# Patient Record
Sex: Female | Born: 1945 | Race: White | Hispanic: No | Marital: Married | Smoking: Former smoker
Health system: Southern US, Community
[De-identification: ages and names within clinical notes are randomized; demographics above are authoritative.]

## PROBLEM LIST (undated history)

## (undated) DIAGNOSIS — E669 Obesity, unspecified: Secondary | ICD-10-CM

## (undated) DIAGNOSIS — G4730 Sleep apnea, unspecified: Secondary | ICD-10-CM

## (undated) DIAGNOSIS — F32A Depression, unspecified: Secondary | ICD-10-CM

## (undated) DIAGNOSIS — I10 Essential (primary) hypertension: Secondary | ICD-10-CM

## (undated) DIAGNOSIS — I8390 Asymptomatic varicose veins of unspecified lower extremity: Secondary | ICD-10-CM

## (undated) DIAGNOSIS — E785 Hyperlipidemia, unspecified: Secondary | ICD-10-CM

## (undated) DIAGNOSIS — F329 Major depressive disorder, single episode, unspecified: Secondary | ICD-10-CM

## (undated) DIAGNOSIS — M1990 Unspecified osteoarthritis, unspecified site: Secondary | ICD-10-CM

## (undated) DIAGNOSIS — F419 Anxiety disorder, unspecified: Secondary | ICD-10-CM

## (undated) HISTORY — DX: Essential (primary) hypertension: I10

## (undated) HISTORY — DX: Depression, unspecified: F32.A

## (undated) HISTORY — DX: Hyperlipidemia, unspecified: E78.5

## (undated) HISTORY — DX: Anxiety disorder, unspecified: F41.9

## (undated) HISTORY — PX: FRACTURE SURGERY: SHX138

## (undated) HISTORY — DX: Obesity, unspecified: E66.9

## (undated) HISTORY — DX: Major depressive disorder, single episode, unspecified: F32.9

## (undated) HISTORY — DX: Unspecified osteoarthritis, unspecified site: M19.90

---

## 2007-05-24 HISTORY — PX: JOINT REPLACEMENT: SHX530

## 2010-08-10 ENCOUNTER — Ambulatory Visit: Payer: Self-pay | Admitting: Family Medicine

## 2012-07-16 DIAGNOSIS — G4710 Hypersomnia, unspecified: Secondary | ICD-10-CM | POA: Diagnosis not present

## 2012-07-16 DIAGNOSIS — I10 Essential (primary) hypertension: Secondary | ICD-10-CM | POA: Diagnosis not present

## 2012-07-16 DIAGNOSIS — E785 Hyperlipidemia, unspecified: Secondary | ICD-10-CM | POA: Diagnosis not present

## 2012-07-16 DIAGNOSIS — R5383 Other fatigue: Secondary | ICD-10-CM | POA: Diagnosis not present

## 2012-10-09 DIAGNOSIS — I10 Essential (primary) hypertension: Secondary | ICD-10-CM | POA: Diagnosis not present

## 2012-10-09 DIAGNOSIS — G4700 Insomnia, unspecified: Secondary | ICD-10-CM | POA: Diagnosis not present

## 2012-10-09 DIAGNOSIS — E7800 Pure hypercholesterolemia, unspecified: Secondary | ICD-10-CM | POA: Diagnosis not present

## 2013-01-08 DIAGNOSIS — Z1289 Encounter for screening for malignant neoplasm of other sites: Secondary | ICD-10-CM | POA: Diagnosis not present

## 2013-01-08 DIAGNOSIS — I10 Essential (primary) hypertension: Secondary | ICD-10-CM | POA: Diagnosis not present

## 2013-01-08 DIAGNOSIS — Z0000 Encounter for general adult medical examination without abnormal findings: Secondary | ICD-10-CM | POA: Diagnosis not present

## 2013-01-08 DIAGNOSIS — E785 Hyperlipidemia, unspecified: Secondary | ICD-10-CM | POA: Diagnosis not present

## 2013-01-08 DIAGNOSIS — E669 Obesity, unspecified: Secondary | ICD-10-CM | POA: Diagnosis not present

## 2013-01-11 DIAGNOSIS — R7989 Other specified abnormal findings of blood chemistry: Secondary | ICD-10-CM | POA: Diagnosis not present

## 2013-02-04 DIAGNOSIS — J209 Acute bronchitis, unspecified: Secondary | ICD-10-CM | POA: Diagnosis not present

## 2013-02-28 DIAGNOSIS — Z23 Encounter for immunization: Secondary | ICD-10-CM | POA: Diagnosis not present

## 2013-05-07 DIAGNOSIS — L821 Other seborrheic keratosis: Secondary | ICD-10-CM | POA: Diagnosis not present

## 2013-05-07 DIAGNOSIS — D235 Other benign neoplasm of skin of trunk: Secondary | ICD-10-CM | POA: Diagnosis not present

## 2013-05-07 DIAGNOSIS — D485 Neoplasm of uncertain behavior of skin: Secondary | ICD-10-CM | POA: Diagnosis not present

## 2013-05-07 DIAGNOSIS — L820 Inflamed seborrheic keratosis: Secondary | ICD-10-CM | POA: Diagnosis not present

## 2013-05-07 DIAGNOSIS — L578 Other skin changes due to chronic exposure to nonionizing radiation: Secondary | ICD-10-CM | POA: Diagnosis not present

## 2013-05-07 DIAGNOSIS — D239 Other benign neoplasm of skin, unspecified: Secondary | ICD-10-CM

## 2013-05-07 DIAGNOSIS — L570 Actinic keratosis: Secondary | ICD-10-CM | POA: Diagnosis not present

## 2013-05-07 HISTORY — DX: Other benign neoplasm of skin, unspecified: D23.9

## 2013-05-29 DIAGNOSIS — D485 Neoplasm of uncertain behavior of skin: Secondary | ICD-10-CM | POA: Diagnosis not present

## 2013-07-02 DIAGNOSIS — F329 Major depressive disorder, single episode, unspecified: Secondary | ICD-10-CM | POA: Diagnosis not present

## 2013-07-02 DIAGNOSIS — E785 Hyperlipidemia, unspecified: Secondary | ICD-10-CM | POA: Diagnosis not present

## 2013-07-02 DIAGNOSIS — F3289 Other specified depressive episodes: Secondary | ICD-10-CM | POA: Diagnosis not present

## 2013-07-02 DIAGNOSIS — I10 Essential (primary) hypertension: Secondary | ICD-10-CM | POA: Diagnosis not present

## 2013-07-03 DIAGNOSIS — E7800 Pure hypercholesterolemia, unspecified: Secondary | ICD-10-CM | POA: Diagnosis not present

## 2013-09-12 DIAGNOSIS — M1710 Unilateral primary osteoarthritis, unspecified knee: Secondary | ICD-10-CM | POA: Diagnosis not present

## 2013-09-12 DIAGNOSIS — IMO0002 Reserved for concepts with insufficient information to code with codable children: Secondary | ICD-10-CM | POA: Diagnosis not present

## 2013-11-21 DIAGNOSIS — M25579 Pain in unspecified ankle and joints of unspecified foot: Secondary | ICD-10-CM | POA: Diagnosis not present

## 2013-11-21 DIAGNOSIS — S82839A Other fracture of upper and lower end of unspecified fibula, initial encounter for closed fracture: Secondary | ICD-10-CM | POA: Diagnosis not present

## 2013-11-21 DIAGNOSIS — S8263XA Displaced fracture of lateral malleolus of unspecified fibula, initial encounter for closed fracture: Secondary | ICD-10-CM | POA: Diagnosis not present

## 2013-11-21 DIAGNOSIS — W19XXXA Unspecified fall, initial encounter: Secondary | ICD-10-CM | POA: Diagnosis not present

## 2013-12-03 DIAGNOSIS — I781 Nevus, non-neoplastic: Secondary | ICD-10-CM | POA: Diagnosis not present

## 2013-12-03 DIAGNOSIS — D485 Neoplasm of uncertain behavior of skin: Secondary | ICD-10-CM | POA: Diagnosis not present

## 2013-12-03 DIAGNOSIS — D239 Other benign neoplasm of skin, unspecified: Secondary | ICD-10-CM | POA: Diagnosis not present

## 2013-12-03 DIAGNOSIS — L821 Other seborrheic keratosis: Secondary | ICD-10-CM | POA: Diagnosis not present

## 2013-12-03 DIAGNOSIS — I8390 Asymptomatic varicose veins of unspecified lower extremity: Secondary | ICD-10-CM | POA: Diagnosis not present

## 2013-12-27 DIAGNOSIS — S93409A Sprain of unspecified ligament of unspecified ankle, initial encounter: Secondary | ICD-10-CM | POA: Diagnosis not present

## 2013-12-27 DIAGNOSIS — M25579 Pain in unspecified ankle and joints of unspecified foot: Secondary | ICD-10-CM | POA: Diagnosis not present

## 2014-01-15 DIAGNOSIS — F3289 Other specified depressive episodes: Secondary | ICD-10-CM | POA: Diagnosis not present

## 2014-01-15 DIAGNOSIS — I10 Essential (primary) hypertension: Secondary | ICD-10-CM | POA: Diagnosis not present

## 2014-01-15 DIAGNOSIS — E669 Obesity, unspecified: Secondary | ICD-10-CM | POA: Diagnosis not present

## 2014-01-15 DIAGNOSIS — Z01419 Encounter for gynecological examination (general) (routine) without abnormal findings: Secondary | ICD-10-CM | POA: Diagnosis not present

## 2014-01-15 DIAGNOSIS — F329 Major depressive disorder, single episode, unspecified: Secondary | ICD-10-CM | POA: Diagnosis not present

## 2014-01-15 DIAGNOSIS — E785 Hyperlipidemia, unspecified: Secondary | ICD-10-CM | POA: Diagnosis not present

## 2014-01-15 DIAGNOSIS — Z23 Encounter for immunization: Secondary | ICD-10-CM | POA: Diagnosis not present

## 2014-02-13 ENCOUNTER — Ambulatory Visit: Payer: Self-pay | Admitting: Family Medicine

## 2014-02-13 DIAGNOSIS — Z1231 Encounter for screening mammogram for malignant neoplasm of breast: Secondary | ICD-10-CM | POA: Diagnosis not present

## 2014-02-13 DIAGNOSIS — M899 Disorder of bone, unspecified: Secondary | ICD-10-CM | POA: Diagnosis not present

## 2014-02-13 DIAGNOSIS — Z780 Asymptomatic menopausal state: Secondary | ICD-10-CM | POA: Diagnosis not present

## 2014-02-13 DIAGNOSIS — M949 Disorder of cartilage, unspecified: Secondary | ICD-10-CM | POA: Diagnosis not present

## 2014-02-18 DIAGNOSIS — G4733 Obstructive sleep apnea (adult) (pediatric): Secondary | ICD-10-CM | POA: Diagnosis not present

## 2014-02-19 ENCOUNTER — Ambulatory Visit: Payer: Self-pay | Admitting: Family Medicine

## 2014-02-19 DIAGNOSIS — N6009 Solitary cyst of unspecified breast: Secondary | ICD-10-CM | POA: Diagnosis not present

## 2014-02-19 DIAGNOSIS — N63 Unspecified lump in unspecified breast: Secondary | ICD-10-CM | POA: Diagnosis not present

## 2014-03-04 DIAGNOSIS — Z23 Encounter for immunization: Secondary | ICD-10-CM | POA: Diagnosis not present

## 2014-04-23 DIAGNOSIS — J029 Acute pharyngitis, unspecified: Secondary | ICD-10-CM | POA: Diagnosis not present

## 2014-05-05 DIAGNOSIS — G4733 Obstructive sleep apnea (adult) (pediatric): Secondary | ICD-10-CM | POA: Diagnosis not present

## 2014-07-17 DIAGNOSIS — I10 Essential (primary) hypertension: Secondary | ICD-10-CM | POA: Diagnosis not present

## 2014-07-17 DIAGNOSIS — E785 Hyperlipidemia, unspecified: Secondary | ICD-10-CM | POA: Diagnosis not present

## 2014-07-17 DIAGNOSIS — F418 Other specified anxiety disorders: Secondary | ICD-10-CM | POA: Diagnosis not present

## 2014-08-04 DIAGNOSIS — Z1283 Encounter for screening for malignant neoplasm of skin: Secondary | ICD-10-CM | POA: Diagnosis not present

## 2014-08-04 DIAGNOSIS — D485 Neoplasm of uncertain behavior of skin: Secondary | ICD-10-CM | POA: Diagnosis not present

## 2014-08-04 DIAGNOSIS — D1800 Hemangioma unspecified site: Secondary | ICD-10-CM | POA: Diagnosis not present

## 2014-08-04 DIAGNOSIS — I8390 Asymptomatic varicose veins of unspecified lower extremity: Secondary | ICD-10-CM | POA: Diagnosis not present

## 2014-08-04 DIAGNOSIS — D229 Melanocytic nevi, unspecified: Secondary | ICD-10-CM | POA: Diagnosis not present

## 2014-08-04 DIAGNOSIS — L578 Other skin changes due to chronic exposure to nonionizing radiation: Secondary | ICD-10-CM | POA: Diagnosis not present

## 2014-08-25 DIAGNOSIS — M79609 Pain in unspecified limb: Secondary | ICD-10-CM | POA: Diagnosis not present

## 2014-08-25 DIAGNOSIS — I8310 Varicose veins of unspecified lower extremity with inflammation: Secondary | ICD-10-CM | POA: Diagnosis not present

## 2014-08-25 DIAGNOSIS — I872 Venous insufficiency (chronic) (peripheral): Secondary | ICD-10-CM | POA: Diagnosis not present

## 2014-08-25 DIAGNOSIS — M7989 Other specified soft tissue disorders: Secondary | ICD-10-CM | POA: Diagnosis not present

## 2014-09-18 DIAGNOSIS — H2513 Age-related nuclear cataract, bilateral: Secondary | ICD-10-CM | POA: Diagnosis not present

## 2014-10-29 ENCOUNTER — Other Ambulatory Visit: Payer: Self-pay | Admitting: Family Medicine

## 2014-10-30 NOTE — Telephone Encounter (Signed)
OK for her to come pick up. Needs appointment for PE with MAC in August per PP notes.

## 2014-10-31 NOTE — Telephone Encounter (Signed)
10/31/14 l/m for pt to make appt prior to picking up meds.  Prescription is at the front after appt is made.

## 2014-12-04 DIAGNOSIS — E669 Obesity, unspecified: Secondary | ICD-10-CM | POA: Diagnosis not present

## 2014-12-04 DIAGNOSIS — E785 Hyperlipidemia, unspecified: Secondary | ICD-10-CM | POA: Diagnosis not present

## 2014-12-04 DIAGNOSIS — I872 Venous insufficiency (chronic) (peripheral): Secondary | ICD-10-CM | POA: Diagnosis not present

## 2014-12-04 DIAGNOSIS — M7989 Other specified soft tissue disorders: Secondary | ICD-10-CM | POA: Diagnosis not present

## 2014-12-04 DIAGNOSIS — I8310 Varicose veins of unspecified lower extremity with inflammation: Secondary | ICD-10-CM | POA: Diagnosis not present

## 2014-12-04 DIAGNOSIS — M79609 Pain in unspecified limb: Secondary | ICD-10-CM | POA: Diagnosis not present

## 2015-01-16 DIAGNOSIS — I10 Essential (primary) hypertension: Secondary | ICD-10-CM | POA: Insufficient documentation

## 2015-01-16 DIAGNOSIS — E785 Hyperlipidemia, unspecified: Secondary | ICD-10-CM | POA: Insufficient documentation

## 2015-01-16 DIAGNOSIS — F419 Anxiety disorder, unspecified: Secondary | ICD-10-CM | POA: Insufficient documentation

## 2015-01-16 DIAGNOSIS — F320 Major depressive disorder, single episode, mild: Secondary | ICD-10-CM | POA: Insufficient documentation

## 2015-01-16 DIAGNOSIS — F329 Major depressive disorder, single episode, unspecified: Secondary | ICD-10-CM

## 2015-01-16 DIAGNOSIS — F32A Depression, unspecified: Secondary | ICD-10-CM | POA: Insufficient documentation

## 2015-01-19 ENCOUNTER — Other Ambulatory Visit: Payer: Self-pay

## 2015-01-19 ENCOUNTER — Ambulatory Visit (INDEPENDENT_AMBULATORY_CARE_PROVIDER_SITE_OTHER): Payer: Medicare Other | Admitting: Family Medicine

## 2015-01-19 ENCOUNTER — Encounter: Payer: Self-pay | Admitting: Family Medicine

## 2015-01-19 VITALS — BP 124/74 | HR 56 | Temp 98.1°F | Ht 65.2 in | Wt 206.0 lb

## 2015-01-19 DIAGNOSIS — I10 Essential (primary) hypertension: Secondary | ICD-10-CM

## 2015-01-19 DIAGNOSIS — Z124 Encounter for screening for malignant neoplasm of cervix: Secondary | ICD-10-CM | POA: Diagnosis not present

## 2015-01-19 DIAGNOSIS — E785 Hyperlipidemia, unspecified: Secondary | ICD-10-CM | POA: Diagnosis not present

## 2015-01-19 DIAGNOSIS — Z0000 Encounter for general adult medical examination without abnormal findings: Secondary | ICD-10-CM

## 2015-01-19 DIAGNOSIS — F329 Major depressive disorder, single episode, unspecified: Secondary | ICD-10-CM

## 2015-01-19 DIAGNOSIS — Z8742 Personal history of other diseases of the female genital tract: Secondary | ICD-10-CM | POA: Diagnosis not present

## 2015-01-19 DIAGNOSIS — F32A Depression, unspecified: Secondary | ICD-10-CM

## 2015-01-19 DIAGNOSIS — N952 Postmenopausal atrophic vaginitis: Secondary | ICD-10-CM | POA: Diagnosis not present

## 2015-01-19 LAB — URINALYSIS, ROUTINE W REFLEX MICROSCOPIC
BILIRUBIN UA: NEGATIVE
GLUCOSE, UA: NEGATIVE
KETONES UA: NEGATIVE
Leukocytes, UA: NEGATIVE
Nitrite, UA: NEGATIVE
RBC UA: NEGATIVE
SPEC GRAV UA: 1.015 (ref 1.005–1.030)
UUROB: 1 mg/dL (ref 0.2–1.0)
pH, UA: 7.5 (ref 5.0–7.5)

## 2015-01-19 MED ORDER — ZOLPIDEM TARTRATE 10 MG PO TABS: ORAL_TABLET | ORAL | Status: DC

## 2015-01-19 MED ORDER — AMLODIPINE BESYLATE 5 MG PO TABS: 5.0000 mg | ORAL_TABLET | Freq: Every day | ORAL | Status: DC

## 2015-01-19 MED ORDER — ATORVASTATIN CALCIUM 20 MG PO TABS: 20.0000 mg | ORAL_TABLET | Freq: Every day | ORAL | Status: DC

## 2015-01-19 MED ORDER — ESTRADIOL 0.1 MG/GM VA CREA: 1.0000 | TOPICAL_CREAM | VAGINAL | Status: DC

## 2015-01-19 MED ORDER — FLUOXETINE HCL 40 MG PO CAPS: 40.0000 mg | ORAL_CAPSULE | Freq: Every day | ORAL | Status: DC

## 2015-01-19 MED ORDER — ATENOLOL 50 MG PO TABS: 50.0000 mg | ORAL_TABLET | Freq: Every day | ORAL | Status: DC

## 2015-01-19 MED ORDER — LOSARTAN POTASSIUM-HCTZ 100-12.5 MG PO TABS: 1.0000 | ORAL_TABLET | Freq: Every day | ORAL | Status: DC

## 2015-01-19 NOTE — Assessment & Plan Note (Signed)
The current medical regimen is effective;  continue present plan and medications.  

## 2015-01-19 NOTE — Progress Notes (Signed)
BP 124/74 mmHg  Pulse 56  Temp(Src) 98.1 F (36.7 C)  Ht 5' 5.2" (1.656 m)  Wt 206 lb (93.441 kg)  BMI 34.07 kg/m2  SpO2 99%   Subjective:    Patient ID: Sherry Davila, female    DOB: [birth year], 69 y.o.   MRN: [id number]  HPI: Sherry Davila is a 69 y.o. female  Chief Complaint  Patient presents with  . Annual Exam   patient's over the last several years has had intermittent constipation for 2-3 days then we'll have a loose bowel movement series over a period of an hour then is normal again for 2-3 days no blood noted and stool eats lots of fiber on a regular basis. Blood pressure doing well Cholesterol medicines doing well Takes meds faithfully with no side effects Uses Ambien on a rare when necessary basis has been out for some time wants a refill just to have.  Relevant past medical, surgical, family and social history reviewed and updated as indicated. Interim medical history since our last visit reviewed. Allergies and medications reviewed and updated.  Review of Systems  Constitutional: Negative.   HENT: Negative.   Eyes: Negative.   Respiratory: Negative.   Cardiovascular: Negative.   Gastrointestinal: Negative.   Endocrine: Negative.   Genitourinary: Negative.   Musculoskeletal: Negative.   Skin: Negative.   Allergic/Immunologic: Negative.   Neurological: Negative.   Hematological: Negative.   Psychiatric/Behavioral: Negative.     Per HPI unless specifically indicated above     Objective:    BP 124/74 mmHg  Pulse 56  Temp(Src) 98.1 F (36.7 C)  Ht 5' 5.2" (1.656 m)  Wt 206 lb (93.441 kg)  BMI 34.07 kg/m2  SpO2 99%  Wt Readings from Last 3 Encounters:  01/19/15 206 lb (93.441 kg)  07/17/14 200 lb (90.719 kg)    Physical Exam  Constitutional: She is oriented to person, place, and time. She appears well-developed and well-nourished.  HENT:  Head: Normocephalic and atraumatic.  Right Ear: External ear normal.  Left Ear: External ear  normal.  Nose: Nose normal.  Mouth/Throat: Oropharynx is clear and moist.  Eyes: Conjunctivae and EOM are normal. Pupils are equal, round, and reactive to light.  Neck: Normal range of motion. Neck supple. Carotid bruit is not present.  Cardiovascular: Normal rate, regular rhythm and normal heart sounds.   No murmur heard. Pulmonary/Chest: Effort normal and breath sounds normal.  Abdominal: Soft. Bowel sounds are normal. There is no hepatosplenomegaly.  Genitourinary: Vagina normal and uterus normal.  Musculoskeletal: Normal range of motion.  Neurological: She is alert and oriented to person, place, and time.  Skin: No rash noted.  Psychiatric: She has a normal mood and affect. Her behavior is normal. Judgment and thought content normal.    No results found for this or any previous visit.    Assessment & Plan:   Problem List Items Addressed This Visit      Cardiovascular and Mediastinum   Hypertension - Primary    The current medical regimen is effective;  continue present plan and medications.       Relevant Medications   losartan-hydrochlorothiazide (HYZAAR) 100-12.5 MG per tablet   atorvastatin (LIPITOR) 20 MG tablet   atenolol (TENORMIN) 50 MG tablet   amLODipine (NORVASC) 5 MG tablet   Other Relevant Orders   CBC with Differential/Platelet   Comprehensive metabolic panel   Urinalysis, Routine w reflex microscopic (not at [facility])   TSH     Genitourinary  Atrophic vaginitis    The current medical regimen is effective;  continue present plan and medications.       Relevant Medications   estradiol (ESTRACE VAGINAL) 0.1 MG/GM vaginal cream     Other   Hyperlipidemia    The current medical regimen is effective;  continue present plan and medications.       Relevant Medications   losartan-hydrochlorothiazide (HYZAAR) 100-12.5 MG per tablet   atorvastatin (LIPITOR) 20 MG tablet   atenolol (TENORMIN) 50 MG tablet   amLODipine (NORVASC) 5 MG tablet   Other  Relevant Orders   CBC with Differential/Platelet   Lipid panel   Comprehensive metabolic panel   Urinalysis, Routine w reflex microscopic (not at [facility])   TSH   Depression    The current medical regimen is effective;  continue present plan and medications.       Relevant Medications   FLUoxetine (PROZAC) 40 MG capsule   Other Relevant Orders   CBC with Differential/Platelet   Comprehensive metabolic panel   Urinalysis, Routine w reflex microscopic (not at [facility])   TSH    Other Visit Diagnoses    PE (physical exam), annual        Relevant Orders    CBC with Differential/Platelet    Lipid panel    Comprehensive metabolic panel    Urinalysis, Routine w reflex microscopic (not at [facility])    TSH        Follow up plan: Return in about 6 months (around 07/21/2015), or if symptoms worsen or fail to improve, for med check.

## 2015-01-19 NOTE — Addendum Note (Signed)
Addended by: Wynn Maudlin on: 01/19/2015 03:52 PM   Modules accepted: Orders, SmartSet

## 2015-01-20 ENCOUNTER — Encounter: Payer: Self-pay | Admitting: Family Medicine

## 2015-01-20 LAB — COMPREHENSIVE METABOLIC PANEL
ALBUMIN: 4.1 g/dL (ref 3.6–4.8)
ALK PHOS: 93 IU/L (ref 39–117)
ALT: 20 IU/L (ref 0–32)
AST: 21 IU/L (ref 0–40)
Albumin/Globulin Ratio: 1.8 (ref 1.1–2.5)
BUN / CREAT RATIO: 21 (ref 11–26)
BUN: 17 mg/dL (ref 8–27)
Bilirubin Total: 0.4 mg/dL (ref 0.0–1.2)
CO2: 25 mmol/L (ref 18–29)
CREATININE: 0.81 mg/dL (ref 0.57–1.00)
Calcium: 9.1 mg/dL (ref 8.7–10.3)
Chloride: 102 mmol/L (ref 97–108)
GFR calc Af Amer: 87 mL/min/{1.73_m2} (ref >59)
GFR calc non Af Amer: 75 mL/min/{1.73_m2} (ref >59)
GLUCOSE: 90 mg/dL (ref 65–99)
Globulin, Total: 2.3 g/dL (ref 1.5–4.5)
Potassium: 4.5 mmol/L (ref 3.5–5.2)
Sodium: 144 mmol/L (ref 134–144)
Total Protein: 6.4 g/dL (ref 6.0–8.5)

## 2015-01-20 LAB — CBC WITH DIFFERENTIAL/PLATELET
BASOS: 1 %
Basophils Absolute: 0.1 10*3/uL (ref 0.0–0.2)
EOS (ABSOLUTE): 0.6 10*3/uL — AB (ref 0.0–0.4)
Eos: 8 %
Hematocrit: 41.4 % (ref 34.0–46.6)
Hemoglobin: 13.4 g/dL (ref 11.1–15.9)
IMMATURE GRANS (ABS): 0 10*3/uL (ref 0.0–0.1)
Immature Granulocytes: 0 %
LYMPHS ABS: 2 10*3/uL (ref 0.7–3.1)
Lymphs: 26 %
MCH: 29.1 pg (ref 26.6–33.0)
MCHC: 32.4 g/dL (ref 31.5–35.7)
MCV: 90 fL (ref 79–97)
MONOS ABS: 0.7 10*3/uL (ref 0.1–0.9)
Monocytes: 10 %
NEUTROS ABS: 4.2 10*3/uL (ref 1.4–7.0)
Neutrophils: 55 %
PLATELETS: 252 10*3/uL (ref 150–379)
RBC: 4.61 x10E6/uL (ref 3.77–5.28)
RDW: 14.3 % (ref 12.3–15.4)
WBC: 7.5 10*3/uL (ref 3.4–10.8)

## 2015-01-20 LAB — LIPID PANEL
Chol/HDL Ratio: 1.8 ratio units (ref 0.0–4.4)
Cholesterol, Total: 195 mg/dL (ref 100–199)
HDL: 107 mg/dL (ref >39)
LDL Calculated: 60 mg/dL (ref 0–99)
Triglycerides: 141 mg/dL (ref 0–149)
VLDL CHOLESTEROL CAL: 28 mg/dL (ref 5–40)

## 2015-01-20 LAB — TSH: TSH: 1.8 u[IU]/mL (ref 0.450–4.500)

## 2015-01-23 LAB — IGP, APTIMA HPV, RFX 16/18,45
HPV APTIMA: NEGATIVE
PAP SMEAR COMMENT: 0

## 2015-01-24 ENCOUNTER — Other Ambulatory Visit: Payer: Self-pay | Admitting: Family Medicine
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
## 2015-02-05 ENCOUNTER — Emergency Department: Payer: Medicare Other

## 2015-02-05 ENCOUNTER — Emergency Department
Admission: EM | Admit: 2015-02-05 | Discharge: 2015-02-05 | Disposition: A | Discharge status: Home / Self Care | Payer: Medicare Other | Attending: Emergency Medicine | Admitting: Emergency Medicine

## 2015-02-05 DIAGNOSIS — M7989 Other specified soft tissue disorders: Secondary | ICD-10-CM | POA: Diagnosis not present

## 2015-02-05 DIAGNOSIS — I10 Essential (primary) hypertension: Secondary | ICD-10-CM | POA: Insufficient documentation

## 2015-02-05 DIAGNOSIS — Z79899 Other long term (current) drug therapy: Secondary | ICD-10-CM | POA: Insufficient documentation

## 2015-02-05 DIAGNOSIS — Z791 Long term (current) use of non-steroidal anti-inflammatories (NSAID): Secondary | ICD-10-CM | POA: Diagnosis not present

## 2015-02-05 DIAGNOSIS — R2242 Localized swelling, mass and lump, left lower limb: Secondary | ICD-10-CM | POA: Insufficient documentation

## 2015-02-05 DIAGNOSIS — Z87891 Personal history of nicotine dependence: Secondary | ICD-10-CM | POA: Diagnosis not present

## 2015-02-05 HISTORY — DX: Asymptomatic varicose veins of unspecified lower extremity: I83.90

## 2015-02-05 MED ORDER — NAPROXEN 500 MG PO TABS: 500.0000 mg | ORAL_TABLET | Freq: Two times a day (BID) | ORAL | Status: DC

## 2015-02-05 NOTE — ED Notes (Signed)
Pt states she just got by from [place] last night, states last week she fell and hurt her left knee .Marland Kitchen A couple of days ago she noticed swelling and achy pain to the left lower leg, pt has noted discoloration, purple/green/blue from mid calf into foot with swelling..denies any numbness or tingling, PCP sent to ED to r/o dvt. No difficulty ambulating.
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
## 2015-02-05 NOTE — Discharge Instructions (Signed)
Your ultrasound of the left leg today did not find any blood clots. Take naproxen twice a day for the next week, and if the symptoms have not resolved, see your doctor or return to the emergency room for a repeat ultrasound of the left leg.return to the ER immediately if you have chest pain shortness of breath or passing out.  Peripheral Edema You have swelling in your legs (peripheral edema). This swelling is due to excess accumulation of salt and water in your body. Edema may be a sign of heart, kidney or liver disease, or a side effect of a medication. It may also be due to problems in the leg veins. Elevating your legs and using special support stockings may be very helpful, if the cause of the swelling is due to poor venous circulation. Avoid long periods of standing, whatever the cause. Treatment of edema depends on identifying the cause. Chips, pretzels, pickles and other salty foods should be avoided. Restricting salt in your diet is almost always needed. Water pills (diuretics) are often used to remove the excess salt and water from your body via urine. These medicines prevent the kidney from reabsorbing sodium. This increases urine flow. Diuretic treatment may also result in lowering of potassium levels in your body. Potassium supplements may be needed if you have to use diuretics daily. Daily weights can help you keep track of your progress in clearing your edema. You should call your caregiver for follow up care as recommended. SEEK IMMEDIATE MEDICAL CARE IF:   You have increased swelling, pain, redness, or heat in your legs.  You develop shortness of breath, especially when lying down.  You develop chest or abdominal pain, weakness, or fainting.  You have a fever. Document Released: 06/16/2004 Document Revised: 08/01/2011 Document Reviewed: 05/27/2009 [facility] Patient Information 2015 [place], [place]. This information is not intended to replace advice given to you by your health care  provider. Make sure you discuss any questions you have with your health care provider.

## 2015-03-01 DIAGNOSIS — Z23 Encounter for immunization: Secondary | ICD-10-CM | POA: Diagnosis not present

## 2015-04-06 ENCOUNTER — Encounter: Payer: Self-pay | Admitting: Podiatry

## 2015-04-06 ENCOUNTER — Ambulatory Visit (INDEPENDENT_AMBULATORY_CARE_PROVIDER_SITE_OTHER): Payer: Medicare Other | Admitting: Podiatry

## 2015-04-06 ENCOUNTER — Ambulatory Visit (INDEPENDENT_AMBULATORY_CARE_PROVIDER_SITE_OTHER): Payer: Medicare Other

## 2015-04-06 VITALS — BP 113/72 | HR 71 | Resp 16

## 2015-04-06 DIAGNOSIS — M2042 Other hammer toe(s) (acquired), left foot: Secondary | ICD-10-CM

## 2015-04-06 DIAGNOSIS — M2012 Hallux valgus (acquired), left foot: Secondary | ICD-10-CM

## 2015-04-06 MED ORDER — MELOXICAM 15 MG PO TABS: 15.0000 mg | ORAL_TABLET | Freq: Every day | ORAL | Status: DC

## 2015-04-06 NOTE — Progress Notes (Signed)
   Subjective:    Patient ID: Sherry Davila, female    DOB: March 07, 1946, 69 y.o.   MRN: [id number]  HPI: She presents today with a history of a painful second metatarsophalangeal joint for approximately year left foot. She states that she's had a bunion for many years and has had a mild hammertoe to the second digit left foot for quite some time however recently she noticed that the toe began to hurt beneath it and it started to lean toward the hallux. She states that shoe gear has become a problem as well as daily activities.    Review of Systems  All other systems reviewed and are negative.      Objective:   Physical Exam  Musculoskeletal:       Feet:   Objective: 69 year old white female in no apparent distress presents today with a chief complaint second metatarsophalangeal joint left foot. Vital signs are stable she is alert and oriented 3. Pulses are strongly palpable. Neurologic sensorium is intact deep tendon reflexes are intact and muscle strength +5 over 5 dorsiflexion plantar flexors and inverters everters all into the musculature is intact. Orthopedic evaluation demonstrates hallux abductovalgus deformity of the left foot with a painful second metatarsophalangeal joint contracted dorsally and deviated medially. 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 Radiographic evaluation demonstrates hallux abductovalgus deformity with good bone stock. An elongated second metatarsal with a medial deviated and dislocated second toe. Lateral view does demonstrate approximately a 45 inclination at the metatarsophalangeal joint second left. Cutaneous evaluation demonstrates supple well-hydrated cutis no erythema edema cellulitis drainage or odor other than some mild swelling beneath second metatarsophalangeal joint left foot.      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  Plan: Discussed etiology pathology conservative versus surgical therapies. At this point I recommended that she start an anti-inflammatory meloxicam 15 mg. I also discussed surgical intervention consisting of an [facility] bunion repair second metatarsal osteotomy and hammertoe repair with screw or pin left foot. I will follow up with her in late December to consider surgery after April.  Roselind Messier DPM

## 2015-05-06 ENCOUNTER — Encounter: Payer: Self-pay | Admitting: Podiatry

## 2015-05-06 ENCOUNTER — Telehealth: Payer: Self-pay | Admitting: *Deleted

## 2015-05-06 ENCOUNTER — Ambulatory Visit (INDEPENDENT_AMBULATORY_CARE_PROVIDER_SITE_OTHER): Payer: Medicare Other | Admitting: Podiatry

## 2015-05-06 VITALS — BP 151/83 | HR 61 | Resp 18

## 2015-05-06 DIAGNOSIS — M2042 Other hammer toe(s) (acquired), left foot: Secondary | ICD-10-CM | POA: Diagnosis not present

## 2015-05-06 DIAGNOSIS — M2012 Hallux valgus (acquired), left foot: Secondary | ICD-10-CM | POA: Diagnosis not present

## 2015-05-06 NOTE — Progress Notes (Signed)
She presents today with her husband for surgical consult regarding her right foot. She states that she was taken by surprise last visit when we discussed surgery would like to discuss it once again. She states that at this point the second toe seems to be doing better however she knows that because of the way that is leaning she will have to have this surgically corrected at some point in time. She states that she has time during January to recover. She denies changes in her past medical history medications allergies surgeries or social history. She states that she is in good health at this point.  Objective: Vital signs are stable she is alert and oriented 3. Pulses are strongly palpable. Neurologic sensorium is intact per Semmes-Weinstein monofilament. Deep tendon reflexes are intact bilateral and muscle strength is intact bilateral. Orthopedic evaluation demonstrates hallux abductovalgus deformity resulting in a very prominent elongated second metatarsal with hammertoe deformity. This hammertoe deformity is painful on palpation as well as range of motion. Radiographs reviewed with her and her husband today demonstrates an increase in the first intermetatarsal angle greater than normal value and elongated second metatarsal. Hammertoe deformity is dorsiflexed and medially deviated. Cutaneous evaluation demonstrates supple well-hydrated cutis no erythema edema saline as drainage or odor no open lesions or wounds.  Assessment: Hallux abductovalgus deformity left foot. Plantarflexed elongated second metatarsal left foot. Hammertoe deformity second digit left foot.  Plan: We discussed the etiology pathology conservative versus surgical therapies. At this point we consented her for an [facility] bunion repair with screw fixation second metatarsal osteotomy with screw fixation hammertoe repair with pin or screw fixation. I answered all the questions regarding these procedures to the best of my ability in layman's  terms today we did discuss the possible postop complications which may include but are not limited to postop pain bleeding swelling infection recurrence need for further surgery overcorrection under correction development of a DVT or pulmonary embolism that may or may not be life threatening. She signed all 3 patient the consent form and was dispensed a cam walker for postop recovery period.

## 2015-05-06 NOTE — Patient Instructions (Signed)
Pre-Operative Instructions  Congratulations, you have decided to take an important step to improving your quality of life.  You can be assured that the doctors of [facility] will be with you every step of the way.  1. Plan to be at the surgery center/hospital at least 1 (one) hour prior to your scheduled time unless otherwise directed by the surgical center/hospital staff.  You must have a responsible adult accompany you, remain during the surgery and drive you home.  Make sure you have directions to the surgical center/hospital and know how to get there on time. 2. For hospital based surgery you will need to obtain a history and physical form from your family physician within 1 month prior to the date of surgery- we will give you a form for you primary physician.  3. We make every effort to accommodate the date you request for surgery.  There are however, times where surgery dates or times have to be moved.  We will contact you as soon as possible if a change in schedule is required.   4. No Aspirin/Ibuprofen for one week before surgery.  If you are on aspirin, any non-steroidal anti-inflammatory medications (Mobic, Aleve, Ibuprofen) you should stop taking it 7 days prior to your surgery.  You make take Tylenol  For pain prior to surgery.  5. Medications- If you are taking daily heart and blood pressure medications, seizure, reflux, allergy, asthma, anxiety, pain or diabetes medications, make sure the surgery center/hospital is aware before the day of surgery so they may notify you which medications to take or avoid the day of surgery. 6. No food or drink after midnight the night before surgery unless directed otherwise by surgical center/hospital staff. 7. No alcoholic beverages 24 hours prior to surgery.  No smoking 24 hours prior to or 24 hours after surgery. 8. Wear loose pants or shorts- loose enough to fit over bandages, boots, and casts. 9. No slip on shoes, sneakers are best. 10. Bring  your boot with you to the surgery center/hospital.  Also bring crutches or a walker if your physician has prescribed it for you.  If you do not have this equipment, it will be provided for you after surgery. 11. If you have not been contracted by the surgery center/hospital by the day before your surgery, call to confirm the date and time of your surgery. 12. Leave-time from work may vary depending on the type of surgery you have.  Appropriate arrangements should be made prior to surgery with your employer. 13. Prescriptions will be provided immediately following surgery by your doctor.  Have these filled as soon as possible after surgery and take the medication as directed. 14. Remove nail polish on the operative foot. 15. Wash the night before surgery.  The night before surgery wash the foot and leg well with the antibacterial soap provided and water paying special attention to beneath the toenails and in between the toes.  Rinse thoroughly with water and dry well with a towel.  Perform this wash unless told not to do so by your physician.  Enclosed: 1 Ice pack (please put in freezer the night before surgery)   1 Hibiclens skin cleaner   Pre-op Instructions  If you have any questions regarding the instructions, do not hesitate to call our office.  [place]: [address] [place], [place] [address] [phone]  [place]: [address] [place], [place] [address] [phone]  [place]: [address]  [place], [place] [address] [phone]  Dr. Richard   Tuchman DPM, Dr. Norman Regal DPM Dr. Richard Sikora DPM, Dr. M. Todd Kyarah Enamorado DPM, Dr. Kathryn Egerton DPM 

## 2015-05-06 NOTE — Telephone Encounter (Signed)
"  I saw Dr. Milinda Pointer today.  I'd like to set up surgery.  Give me a call."

## 2015-05-08 NOTE — Telephone Encounter (Signed)
"  I'm returning your call.  I really hope we speak today.  I want to schedule my surgery for January."

## 2015-05-08 NOTE — Telephone Encounter (Signed)
I attempted to return her call.  I left a message to call me back.

## 2015-05-14 NOTE — Telephone Encounter (Signed)
"  I need to speak with you to schedule surgery as soon as possible."  I attempted to return her call.  I left her a message to call me on Tuesday.

## 2015-05-20 ENCOUNTER — Ambulatory Visit: Payer: Medicare Other | Admitting: Podiatry

## 2015-05-20 NOTE — Telephone Encounter (Signed)
"  I'm frustrated because I've been trying to get my surgery scheduled and haven't been able to reach you."  I'm so sorry, I have been attempting to call you back.  I called you back on 05/14/15 and left a message.  "I know you called it's just been frustrating."  I'm sorry it has been peak season and everyone has been trying to get surgery scheduled before the end of the year.  When would you like to schedule?  "I'd like to do it the 2nd week of January."  He doesn't have anything available on the 13th but he can do it on the 20th or 27th.  "I'll take the 20th."  I'll get it scheduled then.  Go ahead and register with the surgical center.  "I've already done that."  Remember not to eat or drink anything after midnight before surgery.  Surgical center will call you with the arrival time a day or two prior to surgery date.

## 2015-06-11 ENCOUNTER — Other Ambulatory Visit: Payer: Self-pay | Admitting: Podiatry

## 2015-06-11 MED ORDER — PROMETHAZINE HCL 25 MG PO TABS: 25.0000 mg | ORAL_TABLET | Freq: Three times a day (TID) | ORAL | Status: DC | PRN

## 2015-06-11 MED ORDER — OXYCODONE-ACETAMINOPHEN 10-325 MG PO TABS: ORAL_TABLET | ORAL | Status: DC

## 2015-06-11 MED ORDER — CEPHALEXIN 500 MG PO CAPS: 500.0000 mg | ORAL_CAPSULE | Freq: Three times a day (TID) | ORAL | Status: DC

## 2015-06-12 ENCOUNTER — Telehealth: Payer: Self-pay | Admitting: *Deleted

## 2015-06-12 NOTE — Telephone Encounter (Signed)
"  I just left the surgery center. They told me I was going to have to reschedule my surgery to next Friday."  If they told you to come in next Friday, I'm going to have to call them regarding the time.  I will call you back.  "Okay, thank you."  I left patient a message that we will reschedule surgery to next Friday.  Surgical center will call with the arrival time.  Call if you have any questions.

## 2015-06-12 NOTE — Telephone Encounter (Signed)
I'm calling you back, I told you in error that surgery would be rescheduled to next Friday.  He does not have any availability.  His next available date is 07/17/2015.  "Oh no, that late!  I guess put me down for that date.  If there's any cancellations sooner will you let me know?"  Yes, I'll put you on the waiting list.  Will call if there's any cancellations before 07/17/2015.

## 2015-06-17 ENCOUNTER — Encounter: Payer: Self-pay | Admitting: Podiatry

## 2015-06-23 ENCOUNTER — Telehealth: Payer: Self-pay | Admitting: *Deleted

## 2015-06-23 NOTE — Telephone Encounter (Signed)
"  I think I'm scheduled for surgery on February 24th.  I'd like to confirm that.  If there's anything sooner, I'd like to have it."

## 2015-06-23 NOTE — Telephone Encounter (Signed)
I'm calling to verify that we do have you scheduled for 07/17/2015.   "You don't have anything available before then?"  No, we don't have anything at this time.  "Okay, at least I know I'm scheduled for the 24th."

## 2015-06-24 ENCOUNTER — Encounter: Payer: Self-pay | Admitting: Podiatry

## 2015-07-14 ENCOUNTER — Telehealth: Payer: Self-pay | Admitting: *Deleted

## 2015-07-14 NOTE — Telephone Encounter (Signed)
"  I'm scheduled for surgery on Friday, 24th.  I haven't received a call about arrival time yet."    I'm returning your call.  "I never got a time about my arrival time for surgery."  Surgical center normally calls a day or 2 prior to surgery date.  They will give you the arrival time.  "Oh, I wasn't aware of that.  Thank you."

## 2015-07-15 ENCOUNTER — Other Ambulatory Visit: Payer: Self-pay | Admitting: Podiatry

## 2015-07-17 ENCOUNTER — Encounter: Payer: Self-pay | Admitting: Podiatry

## 2015-07-17 DIAGNOSIS — M25572 Pain in left ankle and joints of left foot: Secondary | ICD-10-CM | POA: Diagnosis not present

## 2015-07-17 DIAGNOSIS — M79672 Pain in left foot: Secondary | ICD-10-CM | POA: Diagnosis not present

## 2015-07-17 DIAGNOSIS — M2012 Hallux valgus (acquired), left foot: Secondary | ICD-10-CM | POA: Diagnosis not present

## 2015-07-17 DIAGNOSIS — M21542 Acquired clubfoot, left foot: Secondary | ICD-10-CM | POA: Diagnosis not present

## 2015-07-17 DIAGNOSIS — I10 Essential (primary) hypertension: Secondary | ICD-10-CM | POA: Diagnosis not present

## 2015-07-17 DIAGNOSIS — M2042 Other hammer toe(s) (acquired), left foot: Secondary | ICD-10-CM | POA: Diagnosis not present

## 2015-07-17 DIAGNOSIS — M216X2 Other acquired deformities of left foot: Secondary | ICD-10-CM | POA: Diagnosis not present

## 2015-07-17 DIAGNOSIS — M2022 Hallux rigidus, left foot: Secondary | ICD-10-CM | POA: Diagnosis not present

## 2015-07-21 ENCOUNTER — Ambulatory Visit: Payer: Medicare Other | Admitting: Family Medicine

## 2015-07-22 ENCOUNTER — Ambulatory Visit (INDEPENDENT_AMBULATORY_CARE_PROVIDER_SITE_OTHER): Payer: Medicare Other | Admitting: Podiatry

## 2015-07-22 ENCOUNTER — Encounter: Payer: Self-pay | Admitting: Podiatry

## 2015-07-22 ENCOUNTER — Ambulatory Visit (INDEPENDENT_AMBULATORY_CARE_PROVIDER_SITE_OTHER): Payer: Medicare Other

## 2015-07-22 VITALS — BP 117/64 | HR 66 | Resp 16

## 2015-07-22 DIAGNOSIS — M2042 Other hammer toe(s) (acquired), left foot: Secondary | ICD-10-CM

## 2015-07-22 DIAGNOSIS — M2012 Hallux valgus (acquired), left foot: Secondary | ICD-10-CM | POA: Diagnosis not present

## 2015-07-22 DIAGNOSIS — Z9889 Other specified postprocedural states: Secondary | ICD-10-CM

## 2015-07-22 NOTE — Progress Notes (Signed)
She presents today 1 week status post [place] bunion repair left foot second metatarsal osteotomy and hammertoe repair with K wire. She states that she's been doing pretty well but she does have some pain. She denies fever chills nausea vomiting muscle aches and pains.  Objective: Vital signs are stable alert and oriented 3. Dry sterile dressing intact once removed demonstrates strong palpable pulses moderate edema and no erythema cellulitis drainage or odor she does have a small blister to the distal aspect of the second toe and the K wire is slightly extruded. At this point I'll last the blister and drained that it was only serosanguineous fluid there was no purulence. Radiograph confirms well-healing surgical foot internal fixation is intact K wires in good position.  Assessment: Well-healing surgical foot left.  Plan: Redress today dressed a compressive dressing. Follow up with her in 1 week continue to keep this dry continue to keep it elevated as much as possible.

## 2015-07-29 ENCOUNTER — Ambulatory Visit (INDEPENDENT_AMBULATORY_CARE_PROVIDER_SITE_OTHER): Payer: Medicare Other | Admitting: Podiatry

## 2015-07-29 ENCOUNTER — Encounter: Payer: Self-pay | Admitting: Podiatry

## 2015-07-29 VITALS — BP 130/66 | HR 59 | Resp 16

## 2015-07-29 DIAGNOSIS — M2012 Hallux valgus (acquired), left foot: Secondary | ICD-10-CM | POA: Diagnosis not present

## 2015-07-29 DIAGNOSIS — M2042 Other hammer toe(s) (acquired), left foot: Secondary | ICD-10-CM

## 2015-07-29 DIAGNOSIS — Z9889 Other specified postprocedural states: Secondary | ICD-10-CM | POA: Diagnosis not present

## 2015-07-29 MED ORDER — DOXYCYCLINE HYCLATE 100 MG PO TABS: 100.0000 mg | ORAL_TABLET | Freq: Two times a day (BID) | ORAL | Status: DC

## 2015-07-29 NOTE — Progress Notes (Signed)
She presents today for second postop visit regarding her left foot. Status post [place] bunion repair second metatarsal osteotomy as well as hammertoe repair with pin second digit left foot. She states that she seems to be doing pretty good but the toe still is sore. She denies fever chills nausea vomiting shortness of breath or chest pain.  Objective: Vital signs stable alert and oriented 3. Pulses are palpable. Dry sterile dressing was removed demonstrates much decrease in edema from previous she still has mottling of the second toe with serum collection in the distal aspect of the toe the toe was viable and is warm to the touch in the skin is soft and supple there is capillary refill present. She does have considerable bruising to the toe but there is nothing necrotic about it sutures are removed and margins remain well coapted tibia the toe as well as the first metatarsophalangeal joint incision.  Assessment: Well-healing surgical foot however there is some discoloration to the second toe more than likely some vascular embarrassment initially after surgery and they have been icing the area daily.  Plan: I instructed them to discontinue the ice therapy I redressed today with addressed a compressive dressing and more of a loose dressing to the toe itself. I placed her in a Darco shoe rather than a constrictive Cam Walker and I will follow-up with her in 1 week addressed a compressive dressing was applied to the dorsal aspect of the foot.

## 2015-08-05 ENCOUNTER — Ambulatory Visit (INDEPENDENT_AMBULATORY_CARE_PROVIDER_SITE_OTHER): Payer: Medicare Other

## 2015-08-05 ENCOUNTER — Encounter: Payer: Self-pay | Admitting: Podiatry

## 2015-08-05 ENCOUNTER — Ambulatory Visit (INDEPENDENT_AMBULATORY_CARE_PROVIDER_SITE_OTHER): Payer: Medicare Other | Admitting: Podiatry

## 2015-08-05 VITALS — BP 142/75 | HR 56 | Resp 18

## 2015-08-05 DIAGNOSIS — M2012 Hallux valgus (acquired), left foot: Secondary | ICD-10-CM | POA: Diagnosis not present

## 2015-08-05 DIAGNOSIS — M2042 Other hammer toe(s) (acquired), left foot: Secondary | ICD-10-CM

## 2015-08-05 DIAGNOSIS — Z9889 Other specified postprocedural states: Secondary | ICD-10-CM

## 2015-08-05 NOTE — Progress Notes (Signed)
She presents today 3 weeks status post [facility] bunionectomy repair second metatarsal osteotomy and hammertoe repair second toe with pin. She states that the toe really doesn't look very pretty and the pin seems to be coming out. She states that she's had some soreness in the foot. She denies fever chills nausea vomiting muscle aches and pains other than the tenderness in the foot. She denies shortness of breath and chest pain. No calf pain.  Objective: Vital signs are stable she is alert and oriented 3. K wire to the second toe has come out approximately 1 inch. It appears to be loose; today. The toe remains rectus however there is considerable amount of necrotic epidermis present. I do feel that this is only epidermis the entire toe itself does not look bad. And there is nice capillary fill time to fresh skin distally. I do think that this tissue will demarcate and slough off. She has good range of motion of the first metatarsophalangeal joint and the second metatarsophalangeal joint.  Assessment well-healing osteotomies. Slow healing wound and necrosis of some of the skin to the second toe. Early removal of the pin secondary to extrusion.  Plan: Discussed etiology pathology conservative versus surgical therapies. At this point the pin was removed dressed the foot in a simple dressing encouraged her to keep it dry I will follow up with her on a weekly basis for evaluation of this toe. May need to consider topical nitroglycerin. But based on today's findings I do feel that the necrotic tissue will slough off. Some of it is already done so. I see no signs of infection.

## 2015-08-10 DIAGNOSIS — I8393 Asymptomatic varicose veins of bilateral lower extremities: Secondary | ICD-10-CM | POA: Diagnosis not present

## 2015-08-10 DIAGNOSIS — I781 Nevus, non-neoplastic: Secondary | ICD-10-CM | POA: Diagnosis not present

## 2015-08-10 DIAGNOSIS — Z1283 Encounter for screening for malignant neoplasm of skin: Secondary | ICD-10-CM | POA: Diagnosis not present

## 2015-08-10 DIAGNOSIS — D1800 Hemangioma unspecified site: Secondary | ICD-10-CM | POA: Diagnosis not present

## 2015-08-10 DIAGNOSIS — D229 Melanocytic nevi, unspecified: Secondary | ICD-10-CM | POA: Diagnosis not present

## 2015-08-10 DIAGNOSIS — L812 Freckles: Secondary | ICD-10-CM | POA: Diagnosis not present

## 2015-08-10 DIAGNOSIS — L578 Other skin changes due to chronic exposure to nonionizing radiation: Secondary | ICD-10-CM | POA: Diagnosis not present

## 2015-08-10 DIAGNOSIS — L821 Other seborrheic keratosis: Secondary | ICD-10-CM | POA: Diagnosis not present

## 2015-08-12 ENCOUNTER — Ambulatory Visit (INDEPENDENT_AMBULATORY_CARE_PROVIDER_SITE_OTHER): Payer: Medicare Other

## 2015-08-12 ENCOUNTER — Encounter: Payer: Self-pay | Admitting: Podiatry

## 2015-08-12 ENCOUNTER — Ambulatory Visit (INDEPENDENT_AMBULATORY_CARE_PROVIDER_SITE_OTHER): Payer: Medicare Other | Admitting: Podiatry

## 2015-08-12 ENCOUNTER — Telehealth: Payer: Self-pay | Admitting: *Deleted

## 2015-08-12 VITALS — BP 150/75 | HR 62 | Resp 18

## 2015-08-12 DIAGNOSIS — M2042 Other hammer toe(s) (acquired), left foot: Secondary | ICD-10-CM

## 2015-08-12 DIAGNOSIS — M2012 Hallux valgus (acquired), left foot: Secondary | ICD-10-CM | POA: Diagnosis not present

## 2015-08-12 DIAGNOSIS — Z9889 Other specified postprocedural states: Secondary | ICD-10-CM | POA: Diagnosis not present

## 2015-08-12 MED ORDER — NITROGLYCERIN 0.2 MG/HR TD PT24: 0.2000 mg | MEDICATED_PATCH | Freq: Every day | TRANSDERMAL | Status: DC

## 2015-08-12 NOTE — Progress Notes (Signed)
She presents today one month status post [place] bunion repair second metatarsal osteotomy and hammertoe repair second left. She states this seems to be doing okay but is still swelling.  Objective: Vital signs stable alert and oriented 3. The toe that I was concerned about second left appears to be healing quite nicely I was able to remove a large amount of the dead skin today revealing on the small open wound to the dorsum and a small open wound to the dorsal aspect of the incision overlying the first metatarsophalangeal joint as well clinically no signs of infection at this point.  Assessment: Well-healing surgical foot slightly delayed wound healing.  Plan: Redressed today and will allow her to start soaking this in Epsom salts and water washes and dried thoroughly and redressed slightly I will follow-up with her in 1 week at this point also I am going to add a low dose nitroglycerin patch to the dorsum of her foot to assist in her healing.

## 2015-08-12 NOTE — Telephone Encounter (Addendum)
Pt is requesting refill of Doxycycline, it was faxed to our office a little after pt's appt in [facility].  08/13/2015-Left message informing pt Dr. Milinda Pointer had refilled the Doxycycline and it was called to the [facility] in [place].

## 2015-08-13 MED ORDER — DOXYCYCLINE HYCLATE 100 MG PO TABS: 100.0000 mg | ORAL_TABLET | Freq: Two times a day (BID) | ORAL | Status: DC

## 2015-08-13 NOTE — Telephone Encounter (Signed)
OK 

## 2015-08-19 ENCOUNTER — Ambulatory Visit (INDEPENDENT_AMBULATORY_CARE_PROVIDER_SITE_OTHER): Payer: Medicare Other | Admitting: Podiatry

## 2015-08-19 ENCOUNTER — Encounter: Payer: Self-pay | Admitting: Podiatry

## 2015-08-19 VITALS — BP 121/73 | HR 65 | Resp 18

## 2015-08-19 DIAGNOSIS — Z9889 Other specified postprocedural states: Secondary | ICD-10-CM

## 2015-08-19 DIAGNOSIS — M2042 Other hammer toe(s) (acquired), left foot: Secondary | ICD-10-CM

## 2015-08-19 DIAGNOSIS — M2012 Hallux valgus (acquired), left foot: Secondary | ICD-10-CM

## 2015-08-19 NOTE — Progress Notes (Signed)
She presents today for a follow-up visit date of surgery 07/17/2015. Status post [facility] bunion repair hammertoe repair second digit left foot and second metatarsal osteotomy. She had delayed wound to the second toe which is doing much better now skin has gone on to heal uneventfully. She continues to use her nitroglycerin patch.  Objective: Pulses are strongly palpable left foot. No open lesions or wounds. She has great range of motion of the first metatarsophalangeal joint and the second metatarsophalangeal joint. The toe still has a little bit of contraction at the PIPJ plantarly  Assessment well-healing surgical foot left.  Plan: I encouraged range of motion exercises and I'm shoe gear wear we discussed the pros and cons of shoe gear and what she should wear and what she should not do. I will allow her to go back to the gym for upper body training only. Follow up with her in 3 weeks.

## 2015-09-09 ENCOUNTER — Ambulatory Visit (INDEPENDENT_AMBULATORY_CARE_PROVIDER_SITE_OTHER): Payer: Medicare Other

## 2015-09-09 ENCOUNTER — Ambulatory Visit (INDEPENDENT_AMBULATORY_CARE_PROVIDER_SITE_OTHER): Payer: Medicare Other | Admitting: Podiatry

## 2015-09-09 ENCOUNTER — Encounter: Payer: Self-pay | Admitting: Podiatry

## 2015-09-09 VITALS — BP 145/88 | HR 63 | Resp 18

## 2015-09-09 DIAGNOSIS — M2012 Hallux valgus (acquired), left foot: Secondary | ICD-10-CM | POA: Diagnosis not present

## 2015-09-09 DIAGNOSIS — Z9889 Other specified postprocedural states: Secondary | ICD-10-CM

## 2015-09-09 DIAGNOSIS — M2042 Other hammer toe(s) (acquired), left foot: Secondary | ICD-10-CM

## 2015-09-09 NOTE — Progress Notes (Signed)
She presents today nearly 2 months status post [place] bunion repair second metatarsal osteotomy and hammertoe repair second digit left foot. The hammertoe was slow to heal. She states that the second toe still feels fat and the foot still swells occasionally.  Objective: Vital signs are stable alert and oriented 3. Pulses strongly palpable neurologic sensorium is intact. He is in a rectus position toe is mildly edematous. Radiographically demonstrate well healing osteotomies.  Assessment: Well-healing surgical foot left.  Plan: I demonstrated to her how to wrap the second toe with Coban and I will follow-up with her in 1 month.

## 2015-09-28 ENCOUNTER — Ambulatory Visit (INDEPENDENT_AMBULATORY_CARE_PROVIDER_SITE_OTHER): Payer: Medicare Other | Admitting: Podiatry

## 2015-09-28 ENCOUNTER — Encounter: Payer: Self-pay | Admitting: Podiatry

## 2015-09-28 ENCOUNTER — Ambulatory Visit (INDEPENDENT_AMBULATORY_CARE_PROVIDER_SITE_OTHER): Payer: Medicare Other

## 2015-09-28 VITALS — BP 130/76 | HR 69 | Resp 12

## 2015-09-28 DIAGNOSIS — M2012 Hallux valgus (acquired), left foot: Secondary | ICD-10-CM

## 2015-09-30 NOTE — Progress Notes (Signed)
She presents today for follow-up of her [place] bunion repair and her's second metatarsal osteotomy with hammertoe repair second left. She states it is still swelling but is doing much better.  Objective: Vital signs are stable she is alert and oriented 3. Pulses are palpable. She's great range of motion of the first and second metatarsophalangeal joint. Radiographs do demonstrate a complete osteotomy that has healed along the first and second metatarsal osteotomies on however her PIPJ arthrodesis failed to arthrodesis.  Assessment: Well-healing surgical foot left.  Plan: Follow up with me for a final check in 6 weeks.

## 2015-10-15 DIAGNOSIS — L821 Other seborrheic keratosis: Secondary | ICD-10-CM | POA: Diagnosis not present

## 2015-10-15 DIAGNOSIS — L820 Inflamed seborrheic keratosis: Secondary | ICD-10-CM | POA: Diagnosis not present

## 2015-11-09 ENCOUNTER — Ambulatory Visit (INDEPENDENT_AMBULATORY_CARE_PROVIDER_SITE_OTHER): Payer: Medicare Other

## 2015-11-09 ENCOUNTER — Encounter: Payer: Self-pay | Admitting: Podiatry

## 2015-11-09 ENCOUNTER — Ambulatory Visit (INDEPENDENT_AMBULATORY_CARE_PROVIDER_SITE_OTHER): Payer: Medicare Other | Admitting: Podiatry

## 2015-11-09 DIAGNOSIS — M2042 Other hammer toe(s) (acquired), left foot: Secondary | ICD-10-CM | POA: Diagnosis not present

## 2015-11-09 DIAGNOSIS — M2012 Hallux valgus (acquired), left foot: Secondary | ICD-10-CM

## 2015-11-09 DIAGNOSIS — Z9889 Other specified postprocedural states: Secondary | ICD-10-CM

## 2015-11-09 NOTE — Progress Notes (Signed)
She presents today for follow-up of surgical foot status post [facility] bunion repair left second metatarsal osteotomy left hammertoe with pin second left. Date of surgery 07/17/2015. She states that she is doing much better but the second metatarsophalangeal joint area is painful as she points to the third metatarsophalangeal joint plantarly.  Objective: Vital signs are stable alert and oriented 3. Pulses are palpable bilateral. Neurologic sensory is intact. She has some tenderness on range of motion of the second metatarsophalangeal joint a full active range of motion of the first metatarsophalangeal joint. She has tenderness on palpation of the third metatarsophalangeal joint left foot with a prominent third metatarsal head. Radiographs demonstrate well-healing surgical foot.  Assessment: Well-healing surgical foot  Plan: Follow up with me as needed.

## 2016-01-14 NOTE — Progress Notes (Signed)
DOS 07/17/2015 [place] bunion repair with screw left, second metetarsal osteotomy with screw left, hammer toe repair 2nd left with screw or pin.

## 2016-02-24 DIAGNOSIS — Z23 Encounter for immunization: Secondary | ICD-10-CM | POA: Diagnosis not present

## 2016-03-23 DIAGNOSIS — D485 Neoplasm of uncertain behavior of skin: Secondary | ICD-10-CM | POA: Diagnosis not present

## 2016-03-23 DIAGNOSIS — D229 Melanocytic nevi, unspecified: Secondary | ICD-10-CM | POA: Diagnosis not present

## 2016-03-23 DIAGNOSIS — D2261 Melanocytic nevi of right upper limb, including shoulder: Secondary | ICD-10-CM | POA: Diagnosis not present

## 2016-03-23 DIAGNOSIS — L820 Inflamed seborrheic keratosis: Secondary | ICD-10-CM | POA: Diagnosis not present

## 2016-03-23 DIAGNOSIS — L578 Other skin changes due to chronic exposure to nonionizing radiation: Secondary | ICD-10-CM | POA: Diagnosis not present

## 2016-03-23 DIAGNOSIS — L821 Other seborrheic keratosis: Secondary | ICD-10-CM | POA: Diagnosis not present

## 2016-04-15 ENCOUNTER — Other Ambulatory Visit: Payer: Self-pay | Admitting: Family Medicine

## 2016-04-15 DIAGNOSIS — E785 Hyperlipidemia, unspecified: Secondary | ICD-10-CM

## 2016-04-15 DIAGNOSIS — F32A Depression, unspecified: Secondary | ICD-10-CM

## 2016-04-15 DIAGNOSIS — I10 Essential (primary) hypertension: Secondary | ICD-10-CM

## 2016-04-15 DIAGNOSIS — F329 Major depressive disorder, single episode, unspecified: Secondary | ICD-10-CM

## 2016-04-18 ENCOUNTER — Other Ambulatory Visit: Payer: Self-pay | Admitting: Family Medicine

## 2016-04-18 DIAGNOSIS — I10 Essential (primary) hypertension: Secondary | ICD-10-CM

## 2016-04-18 NOTE — Telephone Encounter (Signed)
apt 

## 2016-04-19 NOTE — Telephone Encounter (Signed)
LMOM for pt to call back to schedule appt

## 2016-08-15 ENCOUNTER — Telehealth: Payer: Self-pay | Admitting: Family Medicine

## 2016-08-15 ENCOUNTER — Encounter: Payer: Self-pay | Admitting: Family Medicine

## 2016-08-15 ENCOUNTER — Ambulatory Visit (INDEPENDENT_AMBULATORY_CARE_PROVIDER_SITE_OTHER): Payer: Medicare Other | Admitting: Family Medicine

## 2016-08-15 VITALS — BP 145/77 | HR 61 | Ht 66.22 in | Wt 217.0 lb

## 2016-08-15 DIAGNOSIS — J019 Acute sinusitis, unspecified: Secondary | ICD-10-CM

## 2016-08-15 DIAGNOSIS — F3342 Major depressive disorder, recurrent, in full remission: Secondary | ICD-10-CM

## 2016-08-15 DIAGNOSIS — Z1239 Encounter for other screening for malignant neoplasm of breast: Secondary | ICD-10-CM

## 2016-08-15 DIAGNOSIS — Z1231 Encounter for screening mammogram for malignant neoplasm of breast: Secondary | ICD-10-CM

## 2016-08-15 DIAGNOSIS — Z1329 Encounter for screening for other suspected endocrine disorder: Secondary | ICD-10-CM

## 2016-08-15 DIAGNOSIS — I10 Essential (primary) hypertension: Secondary | ICD-10-CM | POA: Diagnosis not present

## 2016-08-15 DIAGNOSIS — Z1211 Encounter for screening for malignant neoplasm of colon: Secondary | ICD-10-CM

## 2016-08-15 DIAGNOSIS — E785 Hyperlipidemia, unspecified: Secondary | ICD-10-CM | POA: Diagnosis not present

## 2016-08-15 DIAGNOSIS — Z1159 Encounter for screening for other viral diseases: Secondary | ICD-10-CM

## 2016-08-15 DIAGNOSIS — Z0000 Encounter for general adult medical examination without abnormal findings: Secondary | ICD-10-CM | POA: Diagnosis not present

## 2016-08-15 LAB — MICROSCOPIC EXAMINATION
Bacteria, UA: NONE SEEN
RBC MICROSCOPIC, UA: NONE SEEN /HPF (ref 0–?)
WBC, UA: NONE SEEN /hpf (ref 0–?)

## 2016-08-15 LAB — URINALYSIS, ROUTINE W REFLEX MICROSCOPIC
Bilirubin, UA: NEGATIVE
Glucose, UA: NEGATIVE
Ketones, UA: NEGATIVE
Leukocytes, UA: NEGATIVE
NITRITE UA: NEGATIVE
PH UA: 7 (ref 5.0–7.5)
Protein, UA: NEGATIVE
RBC UA: NEGATIVE
Specific Gravity, UA: 1.02 (ref 1.005–1.030)
UUROB: 0.2 mg/dL (ref 0.2–1.0)

## 2016-08-15 MED ORDER — ATORVASTATIN CALCIUM 20 MG PO TABS: 20.0000 mg | ORAL_TABLET | Freq: Every day | ORAL | 4 refills | Status: DC

## 2016-08-15 MED ORDER — AMLODIPINE BESYLATE 5 MG PO TABS: 5.0000 mg | ORAL_TABLET | Freq: Every day | ORAL | 4 refills | Status: DC

## 2016-08-15 MED ORDER — AMOXICILLIN-POT CLAVULANATE 875-125 MG PO TABS
1.0000 | ORAL_TABLET | Freq: Two times a day (BID) | ORAL | 0 refills | Status: DC
Start: 2016-08-15 — End: 2016-09-26

## 2016-08-15 MED ORDER — ATENOLOL 50 MG PO TABS: 50.0000 mg | ORAL_TABLET | Freq: Every day | ORAL | 4 refills | Status: DC

## 2016-08-15 MED ORDER — LOSARTAN POTASSIUM-HCTZ 100-12.5 MG PO TABS: 1.0000 | ORAL_TABLET | Freq: Every day | ORAL | 4 refills | Status: DC

## 2016-08-15 MED ORDER — FLUOXETINE HCL 40 MG PO CAPS: 40.0000 mg | ORAL_CAPSULE | Freq: Every day | ORAL | 4 refills | Status: DC

## 2016-08-15 NOTE — Telephone Encounter (Signed)
Call pt rx sent 

## 2016-08-15 NOTE — Assessment & Plan Note (Signed)
The current medical regimen is effective;  continue present plan and medications.  

## 2016-08-15 NOTE — Telephone Encounter (Signed)
Please advise. Do not see anything about an antibiotic.

## 2016-08-15 NOTE — Telephone Encounter (Signed)
Patient said she was supposed to be given an antibiotic today but per pharmacy they never received the request for it.  [facility]   Thanks

## 2016-08-15 NOTE — Progress Notes (Signed)
BP (!) 145/77   Pulse 61   Ht 5' 6.22" (1.682 m)   Wt 217 lb (98.4 kg)   SpO2 99%   BMI 34.79 kg/m    Subjective:    Patient ID: Sherry Davila, female    DOB: Jun 12, 1945, 71 y.o.   MRN: [id number]  HPI: Sherry Davila is a 71 y.o. female  Chief Complaint  Patient presents with  . Annual Exam  . Nasal Congestion   Patient all in all doing well except concerned about weight gain with 10 pounds over the last 6 months. Struggling with weight weight loss and activity. One concerns is for the last month or so his had sinus congestion and pressure-type sensation seems to alternate left or right is now seems to be more in her chest with some low-grade cough no real fever but just hasn't felt 100%. Also blood pressure elevated today but patient is out of one of her medications due to refill hassles and maybe an absent. On otherwise taking medications faithfully without problems and an blood pressures been okay. Depression stable with Prozac no issues. Takes Lipitor without problems no issues.  Relevant past medical, surgical, family and social history reviewed and updated as indicated. Interim medical history since our last visit reviewed. Allergies and medications reviewed and updated.  Review of Systems  Constitutional: Negative.   HENT: Positive for congestion, dental problem, rhinorrhea, sinus pain, sinus pressure and sneezing. Negative for ear pain and sore throat.   Eyes: Negative.   Respiratory: Negative.   Cardiovascular: Negative.   Gastrointestinal: Negative.   Endocrine: Negative.   Genitourinary: Negative.   Musculoskeletal: Negative.   Skin: Negative.   Allergic/Immunologic: Negative.   Neurological: Negative.   Hematological: Negative.   Psychiatric/Behavioral: Negative.     Per HPI unless specifically indicated above     Objective:    BP (!) 145/77   Pulse 61   Ht 5' 6.22" (1.682 m)   Wt 217 lb (98.4 kg)   SpO2 99%   BMI 34.79 kg/m   Wt  Readings from Last 3 Encounters:  08/15/16 217 lb (98.4 kg)  02/05/15 207 lb (93.9 kg)  01/19/15 206 lb (93.4 kg)    Physical Exam  Constitutional: She is oriented to person, place, and time. She appears well-developed and well-nourished.  HENT:  Head: Normocephalic and atraumatic.  Right Ear: External ear normal.  Left Ear: External ear normal.  Nose: Nose normal.  Mouth/Throat: Oropharynx is clear and moist.  Eyes: Conjunctivae and EOM are normal. Pupils are equal, round, and reactive to light.  Neck: Normal range of motion. Neck supple. Carotid bruit is not present.  Cardiovascular: Normal rate, regular rhythm and normal heart sounds.   No murmur heard. Pulmonary/Chest: Effort normal and breath sounds normal. She exhibits no mass. Right breast exhibits no mass, no skin change and no tenderness. Left breast exhibits no mass, no skin change and no tenderness. Breasts are symmetrical.  Abdominal: Soft. Bowel sounds are normal. There is no hepatosplenomegaly.  Musculoskeletal: Normal range of motion.  Neurological: She is alert and oriented to person, place, and time.  Skin: No rash noted.  Psychiatric: She has a normal mood and affect. Her behavior is normal. Judgment and thought content normal.    Results for orders placed or performed in visit on 01/19/15  CBC with Differential/Platelet  Result Value Ref Range   WBC 7.5 3.4 - 10.8 x10E3/uL   RBC 4.61 3.77 - 5.28 x10E6/uL   Hemoglobin  13.4 11.1 - 15.9 g/dL   Hematocrit 41.4 34.0 - 46.6 %   MCV 90 79 - 97 fL   MCH 29.1 26.6 - 33.0 pg   MCHC 32.4 31.5 - 35.7 g/dL   RDW 14.3 12.3 - 15.4 %   Platelets 252 150 - 379 x10E3/uL   Neutrophils 55 %   Lymphs 26 %   Monocytes 10 %   Eos 8 %   Basos 1 %   Neutrophils Absolute 4.2 1.4 - 7.0 x10E3/uL   Lymphocytes Absolute 2.0 0.7 - 3.1 x10E3/uL   Monocytes Absolute 0.7 0.1 - 0.9 x10E3/uL   EOS (ABSOLUTE) 0.6 (H) 0.0 - 0.4 x10E3/uL   Basophils Absolute 0.1 0.0 - 0.2 x10E3/uL    Immature Granulocytes 0 %   Immature Grans (Abs) 0.0 0.0 - 0.1 x10E3/uL  Lipid panel  Result Value Ref Range   Cholesterol, Total 195 100 - 199 mg/dL   Triglycerides 141 0 - 149 mg/dL   HDL 107 >39 mg/dL   VLDL Cholesterol Cal 28 5 - 40 mg/dL   LDL Calculated 60 0 - 99 mg/dL   Chol/HDL Ratio 1.8 0.0 - 4.4 ratio units  Comprehensive metabolic panel  Result Value Ref Range   Glucose 90 65 - 99 mg/dL   BUN 17 8 - 27 mg/dL   Creatinine, Ser 0.81 0.57 - 1.00 mg/dL   GFR calc non Af Amer 75 >59 mL/min/1.73   GFR calc Af Amer 87 >59 mL/min/1.73   BUN/Creatinine Ratio 21 11 - 26   Sodium 144 134 - 144 mmol/L   Potassium 4.5 3.5 - 5.2 mmol/L   Chloride 102 97 - 108 mmol/L   CO2 25 18 - 29 mmol/L   Calcium 9.1 8.7 - 10.3 mg/dL   Total Protein 6.4 6.0 - 8.5 g/dL   Albumin 4.1 3.6 - 4.8 g/dL   Globulin, Total 2.3 1.5 - 4.5 g/dL   Albumin/Globulin Ratio 1.8 1.1 - 2.5   Bilirubin Total 0.4 0.0 - 1.2 mg/dL   Alkaline Phosphatase 93 39 - 117 IU/L   AST 21 0 - 40 IU/L   ALT 20 0 - 32 IU/L  Urinalysis, Routine w reflex microscopic (not at [facility])  Result Value Ref Range   Specific Gravity, UA 1.015 1.005 - 1.030   pH, UA 7.5 5.0 - 7.5   Color, UA Yellow Yellow   Appearance Ur Clear Clear   Leukocytes, UA Negative Negative   Protein, UA Trace Negative/Trace   Glucose, UA Negative Negative   Ketones, UA Negative Negative   RBC, UA Negative Negative   Bilirubin, UA Negative Negative   Urobilinogen, Ur 1.0 0.2 - 1.0 mg/dL   Nitrite, UA Negative Negative  TSH  Result Value Ref Range   TSH 1.800 0.450 - 4.500 uIU/mL  IGP, Aptima HPV, rfx 16/18,45  Result Value Ref Range   DIAGNOSIS: Comment    Specimen adequacy: Comment    CLINICIAN PROVIDED ICD10: Comment    Performed by: Comment    PAP SMEAR COMMENT .    Note: Comment    Test Methodology Comment    HPV Aptima Negative Negative      Assessment & Plan:   Problem List Items Addressed This Visit      Cardiovascular and Mediastinum    Hypertension    Poor control will restart medication recheck in a month or so      Relevant Medications   amLODipine (NORVASC) 5 MG tablet   atenolol (TENORMIN) 50 MG  tablet   atorvastatin (LIPITOR) 20 MG tablet   losartan-hydrochlorothiazide (HYZAAR) 100-12.5 MG tablet   Other Relevant Orders   CBC with Differential/Platelet   Comprehensive metabolic panel   Lipid panel   Urinalysis, Routine w reflex microscopic     Other   Hyperlipidemia    The current medical regimen is effective;  continue present plan and medications.       Relevant Medications   amLODipine (NORVASC) 5 MG tablet   atenolol (TENORMIN) 50 MG tablet   atorvastatin (LIPITOR) 20 MG tablet   losartan-hydrochlorothiazide (HYZAAR) 100-12.5 MG tablet   Other Relevant Orders   CBC with Differential/Platelet   Comprehensive metabolic panel   Lipid panel   Urinalysis, Routine w reflex microscopic   Depression    The current medical regimen is effective;  continue present plan and medications.       Relevant Medications   FLUoxetine (PROZAC) 40 MG capsule    Other Visit Diagnoses    Annual physical exam    -  Primary   Relevant Orders   Hepatitis C antibody   CBC with Differential/Platelet   Comprehensive metabolic panel   Lipid panel   TSH   Urinalysis, Routine w reflex microscopic   Need for hepatitis C screening test       Relevant Orders   Hepatitis C antibody   Thyroid disorder screen       Relevant Orders   TSH   Breast cancer screening       Relevant Orders   MM DIGITAL SCREENING BILATERAL   Colon cancer screening       Relevant Orders   Ambulatory referral to Gastroenterology   Acute sinusitis, recurrence not specified, unspecified location       Discussed sinusitis care and treatment Mucinex Tylenol over-the-counter agents and use of antibiotics.       Follow up plan: Return in about 4 weeks (around 09/12/2016) for BP check.

## 2016-08-15 NOTE — Assessment & Plan Note (Signed)
Poor control will restart medication recheck in a month or so

## 2016-08-16 ENCOUNTER — Encounter: Payer: Self-pay | Admitting: Family Medicine

## 2016-08-16 LAB — COMPREHENSIVE METABOLIC PANEL
ALBUMIN: 4.3 g/dL (ref 3.6–4.8)
ALT: 19 IU/L (ref 0–32)
AST: 16 IU/L (ref 0–40)
Albumin/Globulin Ratio: 2 (ref 1.2–2.2)
Alkaline Phosphatase: 108 IU/L (ref 39–117)
BUN / CREAT RATIO: 22 (ref 12–28)
BUN: 18 mg/dL (ref 8–27)
Bilirubin Total: 0.4 mg/dL (ref 0.0–1.2)
CALCIUM: 8.7 mg/dL (ref 8.7–10.3)
CO2: 24 mmol/L (ref 18–29)
CREATININE: 0.83 mg/dL (ref 0.57–1.00)
Chloride: 103 mmol/L (ref 96–106)
GFR calc Af Amer: 83 mL/min/{1.73_m2} (ref >59)
GFR, EST NON AFRICAN AMERICAN: 72 mL/min/{1.73_m2} (ref >59)
GLOBULIN, TOTAL: 2.1 g/dL (ref 1.5–4.5)
Glucose: 91 mg/dL (ref 65–99)
Potassium: 4.2 mmol/L (ref 3.5–5.2)
SODIUM: 143 mmol/L (ref 134–144)
Total Protein: 6.4 g/dL (ref 6.0–8.5)

## 2016-08-16 LAB — CBC WITH DIFFERENTIAL/PLATELET
Basophils Absolute: 0.1 10*3/uL (ref 0.0–0.2)
Basos: 1 %
EOS (ABSOLUTE): 0.4 10*3/uL (ref 0.0–0.4)
EOS: 6 %
HEMATOCRIT: 40.3 % (ref 34.0–46.6)
HEMOGLOBIN: 13.4 g/dL (ref 11.1–15.9)
IMMATURE GRANS (ABS): 0 10*3/uL (ref 0.0–0.1)
IMMATURE GRANULOCYTES: 1 %
Lymphocytes Absolute: 2 10*3/uL (ref 0.7–3.1)
Lymphs: 30 %
MCH: 30 pg (ref 26.6–33.0)
MCHC: 33.3 g/dL (ref 31.5–35.7)
MCV: 90 fL (ref 79–97)
MONOCYTES: 9 %
Monocytes Absolute: 0.6 10*3/uL (ref 0.1–0.9)
NEUTROS PCT: 53 %
Neutrophils Absolute: 3.7 10*3/uL (ref 1.4–7.0)
Platelets: 215 10*3/uL (ref 150–379)
RBC: 4.47 x10E6/uL (ref 3.77–5.28)
RDW: 13.9 % (ref 12.3–15.4)
WBC: 6.8 10*3/uL (ref 3.4–10.8)

## 2016-08-16 LAB — TSH: TSH: 1.28 u[IU]/mL (ref 0.450–4.500)

## 2016-08-16 LAB — HEPATITIS C ANTIBODY: Hep C Virus Ab: <0.1 s/co ratio (ref 0.0–0.9)

## 2016-08-16 LAB — LIPID PANEL
Chol/HDL Ratio: 2 ratio units (ref 0.0–4.4)
Cholesterol, Total: 197 mg/dL (ref 100–199)
HDL: 100 mg/dL (ref >39)
LDL Calculated: 68 mg/dL (ref 0–99)
TRIGLYCERIDES: 143 mg/dL (ref 0–149)
VLDL Cholesterol Cal: 29 mg/dL (ref 5–40)

## 2016-08-31 ENCOUNTER — Telehealth: Payer: Self-pay | Admitting: Gastroenterology

## 2016-08-31 NOTE — Telephone Encounter (Signed)
colonoscopy

## 2016-09-02 ENCOUNTER — Other Ambulatory Visit: Payer: Self-pay

## 2016-09-02 ENCOUNTER — Telehealth: Payer: Self-pay

## 2016-09-02 DIAGNOSIS — Z1211 Encounter for screening for malignant neoplasm of colon: Secondary | ICD-10-CM

## 2016-09-02 MED ORDER — PEG 3350-KCL-NA BICARB-NACL 420 G PO SOLR: 4000.0000 mL | Freq: Once | ORAL | 0 refills | Status: AC

## 2016-09-02 NOTE — Telephone Encounter (Signed)
Gastroenterology Pre-Procedure Review  Request Date: 4/19 Requesting Physician: Dr. Vicente Males  PATIENT REVIEW QUESTIONS: The patient responded to the following health history questions as indicated:    1. Are you having any GI issues? no 2. Do you have a personal history of Polyps? no 3. Do you have a family history of Colon Cancer or Polyps? no 4. Diabetes Mellitus? no 5. Joint replacements in the past 12 months?no 6. Major health problems in the past 3 months?no 7. Any artificial heart valves, MVP, or defibrillator?no    MEDICATIONS & ALLERGIES:    Patient reports the following regarding taking any anticoagulation/antiplatelet therapy:   Plavix, Coumadin, Eliquis, Xarelto, Lovenox, Pradaxa, Brilinta, or Effient? no Aspirin? no  Patient confirms/reports the following medications:  Current Outpatient Prescriptions  Medication Sig Dispense Refill  . amLODipine (NORVASC) 5 MG tablet Take 1 tablet (5 mg total) by mouth daily. 90 tablet 4  . amoxicillin-clavulanate (AUGMENTIN) 875-125 MG tablet Take 1 tablet by mouth 2 (two) times daily. 20 tablet 0  . atenolol (TENORMIN) 50 MG tablet Take 1 tablet (50 mg total) by mouth daily. 90 tablet 4  . atorvastatin (LIPITOR) 20 MG tablet Take 1 tablet (20 mg total) by mouth daily at 6 PM. 90 tablet 4  . FLUoxetine (PROZAC) 40 MG capsule Take 1 capsule (40 mg total) by mouth daily. 90 capsule 4  . losartan-hydrochlorothiazide (HYZAAR) 100-12.5 MG tablet Take 1 tablet by mouth daily. 90 tablet 4   No current facility-administered medications for this visit.     Patient confirms/reports the following allergies:  No Known Allergies  No orders of the defined types were placed in this encounter.   AUTHORIZATION INFORMATION Primary Insurance: 1D#: Group #:  Secondary Insurance: 1D#: Group #:  SCHEDULE INFORMATION: Date: 09/08/16 Time: Location: [place]

## 2016-09-06 ENCOUNTER — Telehealth: Payer: Self-pay | Admitting: Gastroenterology

## 2016-09-06 NOTE — Telephone Encounter (Signed)
09/06/16 NO prior auth required for [place] for Screening Colonoscopy [phone] / Z12.11 Spoke with Zigmund Daniel at [facility]

## 2016-09-07 ENCOUNTER — Encounter: Payer: Self-pay | Admitting: *Deleted

## 2016-09-08 ENCOUNTER — Ambulatory Visit: Payer: Medicare Other | Admitting: Anesthesiology

## 2016-09-08 ENCOUNTER — Encounter
Admission: RE | Disposition: A | Discharge status: Home / Self Care | Payer: Self-pay | Source: Ambulatory Visit | Attending: Gastroenterology

## 2016-09-08 ENCOUNTER — Ambulatory Visit
Admission: RE | Admit: 2016-09-08 | Discharge: 2016-09-08 | Disposition: A | Discharge status: Home / Self Care | Payer: Medicare Other | Source: Ambulatory Visit | Attending: Gastroenterology | Admitting: Gastroenterology

## 2016-09-08 DIAGNOSIS — I10 Essential (primary) hypertension: Secondary | ICD-10-CM | POA: Diagnosis not present

## 2016-09-08 DIAGNOSIS — K635 Polyp of colon: Secondary | ICD-10-CM | POA: Diagnosis not present

## 2016-09-08 DIAGNOSIS — Z96651 Presence of right artificial knee joint: Secondary | ICD-10-CM | POA: Insufficient documentation

## 2016-09-08 DIAGNOSIS — E785 Hyperlipidemia, unspecified: Secondary | ICD-10-CM | POA: Insufficient documentation

## 2016-09-08 DIAGNOSIS — D123 Benign neoplasm of transverse colon: Secondary | ICD-10-CM | POA: Diagnosis not present

## 2016-09-08 DIAGNOSIS — Z6833 Body mass index (BMI) 33.0-33.9, adult: Secondary | ICD-10-CM | POA: Insufficient documentation

## 2016-09-08 DIAGNOSIS — Z87891 Personal history of nicotine dependence: Secondary | ICD-10-CM | POA: Insufficient documentation

## 2016-09-08 DIAGNOSIS — F419 Anxiety disorder, unspecified: Secondary | ICD-10-CM | POA: Diagnosis not present

## 2016-09-08 DIAGNOSIS — D122 Benign neoplasm of ascending colon: Secondary | ICD-10-CM | POA: Diagnosis not present

## 2016-09-08 DIAGNOSIS — G4730 Sleep apnea, unspecified: Secondary | ICD-10-CM | POA: Diagnosis not present

## 2016-09-08 DIAGNOSIS — Z1211 Encounter for screening for malignant neoplasm of colon: Secondary | ICD-10-CM | POA: Diagnosis not present

## 2016-09-08 DIAGNOSIS — E669 Obesity, unspecified: Secondary | ICD-10-CM | POA: Insufficient documentation

## 2016-09-08 DIAGNOSIS — F329 Major depressive disorder, single episode, unspecified: Secondary | ICD-10-CM | POA: Insufficient documentation

## 2016-09-08 HISTORY — DX: Sleep apnea, unspecified: G47.30

## 2016-09-08 HISTORY — PX: COLONOSCOPY WITH PROPOFOL: SHX5780

## 2016-09-08 SURGERY — COLONOSCOPY WITH PROPOFOL
Anesthesia: General

## 2016-09-08 MED ORDER — LIDOCAINE HCL (PF) 2 % IJ SOLN
INTRAMUSCULAR | Status: AC
  Filled 2016-09-08: qty 2

## 2016-09-08 MED ORDER — PROPOFOL 10 MG/ML IV BOLUS
INTRAVENOUS | Status: AC
  Filled 2016-09-08: qty 20

## 2016-09-08 MED ORDER — PROPOFOL 10 MG/ML IV BOLUS
INTRAVENOUS | Status: DC | PRN
  Administered 2016-09-08: 50 mg via INTRAVENOUS
  Administered 2016-09-08: 20 mg via INTRAVENOUS

## 2016-09-08 MED ORDER — SODIUM CHLORIDE 0.9 % IV SOLN
INTRAVENOUS | Status: DC
  Administered 2016-09-08: 11:00:00 via INTRAVENOUS

## 2016-09-08 MED ORDER — MIDAZOLAM HCL 2 MG/2ML IJ SOLN
INTRAMUSCULAR | Status: AC
  Filled 2016-09-08: qty 2

## 2016-09-08 MED ORDER — EPHEDRINE SULFATE 50 MG/ML IJ SOLN
INTRAMUSCULAR | Status: AC
  Filled 2016-09-08: qty 1

## 2016-09-08 MED ORDER — PROPOFOL 500 MG/50ML IV EMUL
INTRAVENOUS | Status: DC | PRN
  Administered 2016-09-08: 100 ug/kg/min via INTRAVENOUS

## 2016-09-08 MED ORDER — FENTANYL CITRATE (PF) 100 MCG/2ML IJ SOLN
INTRAMUSCULAR | Status: AC
  Filled 2016-09-08: qty 2

## 2016-09-08 MED ORDER — MIDAZOLAM HCL 5 MG/5ML IJ SOLN
INTRAMUSCULAR | Status: DC | PRN
  Administered 2016-09-08: 2 mg via INTRAVENOUS

## 2016-09-08 MED ORDER — PHENYLEPHRINE HCL 10 MG/ML IJ SOLN
INTRAMUSCULAR | Status: AC
  Filled 2016-09-08: qty 1

## 2016-09-08 MED ORDER — LIDOCAINE HCL (PF) 2 % IJ SOLN
INTRAMUSCULAR | Status: DC | PRN
  Administered 2016-09-08: 50 mg

## 2016-09-08 MED ORDER — FENTANYL CITRATE (PF) 100 MCG/2ML IJ SOLN
INTRAMUSCULAR | Status: DC | PRN
  Administered 2016-09-08 (×2): 50 ug via INTRAVENOUS

## 2016-09-08 NOTE — Anesthesia Preprocedure Evaluation (Signed)
Anesthesia Evaluation  Patient identified by MRN, date of birth, ID band Patient awake    Reviewed: Allergy & Precautions, NPO status , Patient's Chart, lab work & pertinent test results, reviewed documented beta blocker date and time   Airway Mallampati: III  TM Distance: <3 FB     Dental  (+) Caps   Pulmonary sleep apnea , former smoker,    Pulmonary exam normal        Cardiovascular hypertension, Pt. on medications and Pt. on home beta blockers Normal cardiovascular exam     Neuro/Psych PSYCHIATRIC DISORDERS Anxiety Depression    GI/Hepatic negative GI ROS, Neg liver ROS,   Endo/Other  negative endocrine ROS  Renal/GU negative Renal ROS     Musculoskeletal negative musculoskeletal ROS (+)   Abdominal Normal abdominal exam  (+) + obese,   Peds negative pediatric ROS (+)  Hematology negative hematology ROS (+)   Anesthesia Other Findings   Reproductive/Obstetrics                             Anesthesia Physical Anesthesia Plan  ASA: II  Anesthesia Plan: General   Post-op Pain Management:    Induction: Intravenous  Airway Management Planned: Nasal Cannula  Additional Equipment:   Intra-op Plan:   Post-operative Plan:   Informed Consent: I have reviewed the patients History and Physical, chart, labs and discussed the procedure including the risks, benefits and alternatives for the proposed anesthesia with the patient or authorized representative who has indicated his/her understanding and acceptance.   Dental advisory given  Plan Discussed with: CRNA and Surgeon  Anesthesia Plan Comments:         Anesthesia Quick Evaluation

## 2016-09-08 NOTE — Anesthesia Postprocedure Evaluation (Signed)
Anesthesia Post Note  Patient: Sherry Davila  Procedure(s) Performed: Procedure(s) (LRB): COLONOSCOPY WITH PROPOFOL (N/A)  Patient location during evaluation: PACU Anesthesia Type: General Level of consciousness: awake and alert and oriented Pain management: pain level controlled Vital Signs Assessment: post-procedure vital signs reviewed and stable Respiratory status: spontaneous breathing Cardiovascular status: blood pressure returned to baseline Anesthetic complications: no     Last Vitals:  Vitals:   09/08/16 1210 09/08/16 1220  BP: (!) 174/74 (!) 168/73  Pulse: (!) 52 (!) 51  Resp: 11 13  Temp:      Last Pain:  Vitals:   09/08/16 1154  TempSrc: Tympanic                 [name]

## 2016-09-08 NOTE — H&P (Signed)
Sherry Bellows MD [address] [place], [place] [address] Phone: [phone] Fax : [phone]  Primary Care Physician:  Golden Pop, MD Primary Gastroenterologist:  Dr. Jonathon Davila   Pre-Procedure History & Physical: HPI:  Sherry Davila is a 70 y.o. female is here for an colonoscopy.   Past Medical History:  Diagnosis Date  . Anxiety   . Depression   . Hyperlipidemia   . Hypertension   . Obesity   . Sleep apnea    has a cpap, doesn't wear   . Varicose vein     Past Surgical History:  Procedure Laterality Date  . FRACTURE SURGERY     foot, screws placed  . JOINT REPLACEMENT Right 2009   knee    Prior to Admission medications   Medication Sig Start Date End Date Taking? Authorizing Provider  amLODipine (NORVASC) 5 MG tablet Take 1 tablet (5 mg total) by mouth daily. 08/15/16   Guadalupe Maple, MD  amoxicillin-clavulanate (AUGMENTIN) 875-125 MG tablet Take 1 tablet by mouth 2 (two) times daily. Patient not taking: Reported on 09/08/2016 08/15/16   Guadalupe Maple, MD  atenolol (TENORMIN) 50 MG tablet Take 1 tablet (50 mg total) by mouth daily. 08/15/16   Guadalupe Maple, MD  atorvastatin (LIPITOR) 20 MG tablet Take 1 tablet (20 mg total) by mouth daily at 6 PM. 08/15/16   Guadalupe Maple, MD  FLUoxetine (PROZAC) 40 MG capsule Take 1 capsule (40 mg total) by mouth daily. 08/15/16   Guadalupe Maple, MD  losartan-hydrochlorothiazide (HYZAAR) 100-12.5 MG tablet Take 1 tablet by mouth daily. 08/15/16   Guadalupe Maple, MD    Allergies as of 09/02/2016  . (No Known Allergies)    Family History  Problem Relation Age of Onset  . Cancer Mother   . Heart failure Mother   . Heart disease Mother   . AAA (abdominal aortic aneurysm) Father   . Hypertension Father   . Heart disease Father   . Hypertension Sister     Social History   Social History  . Marital status: Married    Spouse name: N/A  . Number of children: N/A  . Years of education: N/A    Occupational History  . Not on file.   Social History Main Topics  . Smoking status: Former Smoker    Types: Cigarettes    Quit date: 01/19/1988  . Smokeless tobacco: Never Used  . Alcohol use 0.0 oz/week     Comment: occasional   . Drug use: No  . Sexual activity: Not on file   Other Topics Concern  . Not on file   Social History Narrative  . No narrative on file    Review of Systems: See HPI, otherwise negative ROS  Physical Exam: BP (!) 194/74   Pulse (!) 58   Temp (!) 96.5 F (35.8 C) (Oral)   Resp 18   Ht 5\' 6"  (1.676 m)   Wt 209 lb (94.8 kg)   SpO2 100%   BMI 33.73 kg/m  General:   Alert,  pleasant and cooperative in NAD Head:  Normocephalic and atraumatic. Neck:  Supple; no masses or thyromegaly. Lungs:  Clear throughout to auscultation.    Heart:  Regular rate and rhythm. Abdomen:  Soft, nontender and nondistended. Normal bowel sounds, without guarding, and without rebound.   Neurologic:  Alert and  oriented x4;  grossly normal neurologically.  Impression/Plan: Sherry Davila is here for an colonoscopy to be performed  for Screening colonoscopy average risk    Risks, benefits, limitations, and alternatives regarding  colonoscopy have been reviewed with the patient.  Questions have been answered.  All parties agreeable.   Sherry Bellows, MD  09/08/2016, 11:13 AM

## 2016-09-08 NOTE — Op Note (Signed)
[facility] Gastroenterology Patient Name: Sherry Davila Procedure Date: 09/08/2016 11:18 AM MRN: [id number] Account #: [id number] Date of Birth: [birth year] Admit Type: Outpatient Age: 71 Room: [facility] ENDO ROOM 4 Gender: Female Note Status: Finalized Procedure:            Colonoscopy Indications:          Screening for colorectal malignant neoplasm Providers:            Jonathon Bellows MD, MD Medicines:            Monitored Anesthesia Care Complications:        No immediate complications. Procedure:            Pre-Anesthesia Assessment:                       - Prior to the procedure, a History and Physical was                        performed, and patient medications, allergies and                        sensitivities were reviewed. The patient's tolerance of                        previous anesthesia was reviewed.                       - The risks and benefits of the procedure and the                        sedation options and risks were discussed with the                        patient. All questions were answered and informed                        consent was obtained.                       - ASA Grade Assessment: II - A patient with mild                        systemic disease.                       After obtaining informed consent, the colonoscope was                        passed under direct vision. Throughout the procedure,                        the patient's blood pressure, pulse, and oxygen                        saturations were monitored continuously. The                        Colonoscope was introduced through the anus and                        advanced to the the cecum, identified by the  appendiceal orifice, IC valve and transillumination.                        The colonoscopy was performed with ease. The patient                        tolerated the procedure well. The quality of the bowel                        preparation was  adequate to identify polyps. Findings:      The perianal and digital rectal examinations were normal.      A 3 mm polyp was found in the ascending colon. The polyp was sessile.       The polyp was removed with a cold biopsy forceps. Resection and       retrieval were complete.      A 10 mm polyp was found in the transverse colon. The polyp was sessile.       The polyp was removed with a cold snare. Resection and retrieval were       complete.      The exam was otherwise without abnormality. Impression:           - One 3 mm polyp in the ascending colon, removed with a                        cold biopsy forceps. Resected and retrieved.                       - One 10 mm polyp in the transverse colon, removed with                        a cold snare. Resected and retrieved.                       - The examination was otherwise normal. Recommendation:       - Discharge patient to home (with escort).                       - Resume previous diet.                       - Continue present medications.                       - Await pathology results.                       - Repeat colonoscopy in 3 years for surveillance. Procedure Code(s):    --- Professional ---                       [phone], Colonoscopy, flexible; with removal of tumor(s),                        polyp(s), or other lesion(s) by snare technique                       45380, 59, Colonoscopy, flexible; with biopsy, single                        or multiple Diagnosis Code(s):    ---  Professional ---                       Z12.11, Encounter for screening for malignant neoplasm                        of colon                       D12.2, Benign neoplasm of ascending colon                       D12.3, Benign neoplasm of transverse colon (hepatic                        flexure or splenic flexure) CPT copyright 2016 American Medical Association. All rights reserved. The codes documented in this report are preliminary and upon coder review  may  be revised to meet current compliance requirements. Jonathon Bellows, MD Jonathon Bellows MD, MD 09/08/2016 11:53:13 AM This report has been signed electronically. Number of Addenda: 0 Note Initiated On: 09/08/2016 11:18 AM Scope Withdrawal Time: 0 hours 16 minutes 58 seconds  Total Procedure Duration: 0 hours 21 minutes 5 seconds       [facility]

## 2016-09-08 NOTE — Transfer of Care (Signed)
Immediate Anesthesia Transfer of Care Note  Patient: Sherry Davila  Procedure(s) Performed: Procedure(s): COLONOSCOPY WITH PROPOFOL (N/A)  Patient Location: PACU  Anesthesia Type:General  Level of Consciousness: sedated  Airway & Oxygen Therapy: Patient Spontanous Breathing and Patient connected to nasal cannula oxygen  Post-op Assessment: Report given to RN and Post -op Vital signs reviewed and stable  Post vital signs: Reviewed and stable  Last Vitals:  Vitals:   09/08/16 1051  BP: (!) 194/74  Pulse: (!) 58  Resp: 18  Temp: (!) 35.8 C    Last Pain:  Vitals:   09/08/16 1051  TempSrc: Oral         Complications: No apparent anesthesia complications

## 2016-09-08 NOTE — Anesthesia Post-op Follow-up Note (Cosign Needed)
Anesthesia QCDR form completed.        

## 2016-09-09 ENCOUNTER — Encounter: Payer: Self-pay | Admitting: Gastroenterology

## 2016-09-09 LAB — SURGICAL PATHOLOGY

## 2016-09-14 ENCOUNTER — Encounter: Payer: Self-pay | Admitting: Gastroenterology

## 2016-09-15 ENCOUNTER — Ambulatory Visit: Payer: Medicare Other | Admitting: Family Medicine

## 2016-09-26 ENCOUNTER — Encounter: Payer: Self-pay | Admitting: Family Medicine

## 2016-09-26 ENCOUNTER — Ambulatory Visit (INDEPENDENT_AMBULATORY_CARE_PROVIDER_SITE_OTHER): Payer: Medicare Other | Admitting: Family Medicine

## 2016-09-26 VITALS — BP 119/77 | HR 64 | Temp 98.1°F | Wt 215.0 lb

## 2016-09-26 DIAGNOSIS — I10 Essential (primary) hypertension: Secondary | ICD-10-CM

## 2016-09-26 DIAGNOSIS — J019 Acute sinusitis, unspecified: Secondary | ICD-10-CM | POA: Diagnosis not present

## 2016-09-26 MED ORDER — AMOXICILLIN-POT CLAVULANATE 875-125 MG PO TABS: 1.0000 | ORAL_TABLET | Freq: Two times a day (BID) | ORAL | 0 refills | Status: DC

## 2016-09-26 MED ORDER — FLUTICASONE PROPIONATE 50 MCG/ACT NA SUSP: 2.0000 | Freq: Every day | NASAL | 6 refills | Status: DC

## 2016-09-26 NOTE — Assessment & Plan Note (Signed)
Discussed sinusitis care and treatment patient for now will not start antibiotics wait several days to see course. Patient will use Mucinex Allegra and Flonase nasal rinse and Tylenol.

## 2016-09-26 NOTE — Assessment & Plan Note (Signed)
The current medical regimen is effective;  continue present plan and medications. a 

## 2016-09-26 NOTE — Progress Notes (Addendum)
   BP 119/77   Pulse 64   Temp 98.1 F (36.7 C) (Oral)   Wt 215 lb (97.5 kg)   SpO2 98%   BMI 34.70 kg/m    Subjective:    Patient ID: Clemon Davila, female    DOB: Feb 13, 1946, 71 y.o.   MRN: [id number]  HPI: Sherry Davila is a 71 y.o. female  Chief Complaint  Patient presents with  . Blood Pressure Check  . URI   Patient back on blood pressure medicines doing well no complaints good blood pressure control no low blood pressure symptoms or side effects from medications. Patient for the last 4 days of sinus sinus pressure congestion drainage been using Mucinex and Tylenol with very little relief but may be getting a little better until today and started feeling worse again. No fever. Relevant past medical, surgical, family and social history reviewed and updated as indicated. Interim medical history since our last visit reviewed. Allergies and medications reviewed and updated.  Review of Systems  Constitutional: Positive for fatigue. Negative for diaphoresis and fever.  HENT: Positive for congestion, rhinorrhea, sinus pain and sinus pressure. Negative for ear pain.   Respiratory: Negative.  Negative for cough, choking and chest tightness.   Cardiovascular: Negative.     Per HPI unless specifically indicated above     Objective:    BP 119/77   Pulse 64   Temp 98.1 F (36.7 C) (Oral)   Wt 215 lb (97.5 kg)   SpO2 98%   BMI 34.70 kg/m   Wt Readings from Last 3 Encounters:  09/26/16 215 lb (97.5 kg)  09/08/16 209 lb (94.8 kg)  08/15/16 217 lb (98.4 kg)    Physical Exam  Constitutional: She is oriented to person, place, and time. She appears well-developed and well-nourished.  HENT:  Head: Normocephalic and atraumatic.  Right Ear: External ear normal.  Left Ear: External ear normal.  Mouth/Throat: Oropharyngeal exudate present.  Eyes: Conjunctivae and EOM are normal.  Neck: Normal range of motion.  Cardiovascular: Normal rate, regular rhythm and normal  heart sounds.   Pulmonary/Chest: Effort normal and breath sounds normal.  Musculoskeletal: Normal range of motion.  Lymphadenopathy:    She has no cervical adenopathy.  Neurological: She is alert and oriented to person, place, and time.  Skin: No erythema.  Psychiatric: She has a normal mood and affect. Her behavior is normal. Judgment and thought content normal.        Assessment & Plan:   Problem List Items Addressed This Visit      Cardiovascular and Mediastinum   Hypertension    The current medical regimen is effective;  continue present plan and medications. a        Respiratory   Acute sinusitis - Primary    Discussed sinusitis care and treatment patient for now will not start antibiotics wait several days to see course. Patient will use Mucinex Allegra and Flonase nasal rinse and Tylenol.      Relevant Medications   amoxicillin-clavulanate (AUGMENTIN) 875-125 MG tablet   fluticasone (FLONASE) 50 MCG/ACT nasal spray       Follow up plan: Return for Sept, BMP,  Lipids, ALT, AST.

## 2016-09-29 ENCOUNTER — Ambulatory Visit
Admission: RE | Admit: 2016-09-29 | Discharge: 2016-09-29 | Disposition: A | Discharge status: Home / Self Care | Payer: Medicare Other | Source: Ambulatory Visit | Attending: Family Medicine | Admitting: Family Medicine

## 2016-09-29 DIAGNOSIS — Z1231 Encounter for screening mammogram for malignant neoplasm of breast: Secondary | ICD-10-CM | POA: Diagnosis not present

## 2016-09-29 DIAGNOSIS — Z1239 Encounter for other screening for malignant neoplasm of breast: Secondary | ICD-10-CM

## 2016-12-07 ENCOUNTER — Telehealth: Payer: Self-pay | Admitting: Family Medicine

## 2016-12-07 DIAGNOSIS — Z23 Encounter for immunization: Secondary | ICD-10-CM

## 2016-12-07 NOTE — Telephone Encounter (Signed)
Patient would like to know when last TDAP  Shot was given. Looked in patient's chart and did not see on my end.  Please Advise.  Thank you

## 2016-12-07 NOTE — Telephone Encounter (Signed)
Order placed- she can get it whenever she'd like

## 2016-12-07 NOTE — Telephone Encounter (Signed)
Patient called back and wanted to know if she could get this done at a pharmacy??  Please advise.  Thanks  [phone]

## 2016-12-07 NOTE — Telephone Encounter (Signed)
Last TD was  Td 01/15/2014

## 2016-12-07 NOTE — Telephone Encounter (Signed)
Message left to make pt aware.

## 2016-12-07 NOTE — Telephone Encounter (Signed)
Pt's is having a new granddaughter. Would like Tdap, advised that cost may have to come out of pocket. Pt verbalized understanding. Okay to order?

## 2016-12-07 NOTE — Telephone Encounter (Signed)
Left message advising pt to call pharmacy and double check that they are able to give and to let us know what pharmacy and we can send rx over.

## 2016-12-08 ENCOUNTER — Ambulatory Visit (INDEPENDENT_AMBULATORY_CARE_PROVIDER_SITE_OTHER): Payer: Medicare Other

## 2016-12-08 DIAGNOSIS — Z23 Encounter for immunization: Secondary | ICD-10-CM

## 2016-12-08 NOTE — Progress Notes (Signed)
Immunization give. Pt aware that insurance may not cover vaccine. Pt verbalized understanding.

## 2017-02-20 DIAGNOSIS — Z23 Encounter for immunization: Secondary | ICD-10-CM | POA: Diagnosis not present

## 2017-03-29 DIAGNOSIS — D1800 Hemangioma unspecified site: Secondary | ICD-10-CM | POA: Diagnosis not present

## 2017-03-29 DIAGNOSIS — L821 Other seborrheic keratosis: Secondary | ICD-10-CM | POA: Diagnosis not present

## 2017-03-29 DIAGNOSIS — I8393 Asymptomatic varicose veins of bilateral lower extremities: Secondary | ICD-10-CM | POA: Diagnosis not present

## 2017-03-29 DIAGNOSIS — Z1283 Encounter for screening for malignant neoplasm of skin: Secondary | ICD-10-CM | POA: Diagnosis not present

## 2017-03-29 DIAGNOSIS — D225 Melanocytic nevi of trunk: Secondary | ICD-10-CM | POA: Diagnosis not present

## 2017-03-29 DIAGNOSIS — D485 Neoplasm of uncertain behavior of skin: Secondary | ICD-10-CM | POA: Diagnosis not present

## 2017-03-29 DIAGNOSIS — L578 Other skin changes due to chronic exposure to nonionizing radiation: Secondary | ICD-10-CM | POA: Diagnosis not present

## 2017-03-29 DIAGNOSIS — L812 Freckles: Secondary | ICD-10-CM | POA: Diagnosis not present

## 2017-03-29 DIAGNOSIS — L820 Inflamed seborrheic keratosis: Secondary | ICD-10-CM | POA: Diagnosis not present

## 2017-05-31 DIAGNOSIS — L821 Other seborrheic keratosis: Secondary | ICD-10-CM | POA: Diagnosis not present

## 2017-05-31 DIAGNOSIS — L578 Other skin changes due to chronic exposure to nonionizing radiation: Secondary | ICD-10-CM | POA: Diagnosis not present

## 2017-05-31 DIAGNOSIS — L820 Inflamed seborrheic keratosis: Secondary | ICD-10-CM | POA: Diagnosis not present

## 2017-07-12 ENCOUNTER — Telehealth: Payer: Self-pay

## 2017-07-12 NOTE — Telephone Encounter (Signed)
Copied from [place] [phone]. Topic: Medicare AWV >> Jul 12, 2017  2:24 PM [place], [place] A, LPN wrote: Reason for [id number] to schedule medicare annual wellness visit with [facility] at [facility]. Can be scheduled anytime after 08/16/2017

## 2017-08-17 ENCOUNTER — Ambulatory Visit (INDEPENDENT_AMBULATORY_CARE_PROVIDER_SITE_OTHER): Payer: Medicare Other

## 2017-08-17 ENCOUNTER — Other Ambulatory Visit: Payer: Self-pay | Admitting: Family Medicine

## 2017-08-17 VITALS — BP 146/81 | HR 58 | Temp 98.3°F | Resp 16 | Ht 65.0 in | Wt 213.1 lb

## 2017-08-17 DIAGNOSIS — F3342 Major depressive disorder, recurrent, in full remission: Secondary | ICD-10-CM

## 2017-08-17 DIAGNOSIS — I10 Essential (primary) hypertension: Secondary | ICD-10-CM

## 2017-08-17 DIAGNOSIS — E785 Hyperlipidemia, unspecified: Secondary | ICD-10-CM

## 2017-08-17 DIAGNOSIS — Z0000 Encounter for general adult medical examination without abnormal findings: Secondary | ICD-10-CM | POA: Diagnosis not present

## 2017-08-17 MED ORDER — FLUOXETINE HCL 40 MG PO CAPS: 40.0000 mg | ORAL_CAPSULE | Freq: Every day | ORAL | 0 refills | Status: DC

## 2017-08-17 MED ORDER — LOSARTAN POTASSIUM-HCTZ 100-12.5 MG PO TABS: 1.0000 | ORAL_TABLET | Freq: Every day | ORAL | 0 refills | Status: DC

## 2017-08-17 MED ORDER — ATENOLOL 50 MG PO TABS: 50.0000 mg | ORAL_TABLET | Freq: Every day | ORAL | 0 refills | Status: DC

## 2017-08-17 MED ORDER — AMLODIPINE BESYLATE 5 MG PO TABS: 5.0000 mg | ORAL_TABLET | Freq: Every day | ORAL | 0 refills | Status: DC

## 2017-08-17 MED ORDER — ATORVASTATIN CALCIUM 20 MG PO TABS: 20.0000 mg | ORAL_TABLET | Freq: Every day | ORAL | 0 refills | Status: DC

## 2017-08-17 NOTE — Patient Instructions (Signed)
Sherry Davila , Thank you for taking time to come for your Medicare Wellness Visit. I appreciate your ongoing commitment to your health goals. Please review the following plan we discussed and let me know if I can assist you in the future.   Screening recommendations/referrals: Colonoscopy: completed 09/08/2016 Mammogram: completed 09/29/2016 Bone Density: completed 02/13/2014 Recommended yearly ophthalmology/optometry visit for glaucoma screening and checkup Recommended yearly dental visit for hygiene and checkup  Vaccinations: Influenza vaccine: declined Pneumococcal vaccine: up to date  Tdap vaccine: up to date  Shingles vaccine: shingrix eligible, check with your insurance company for coverage   Advanced directives: Advance directive discussed with you today. I have provided a copy for you to complete at home and have notarized. Once this is complete please bring a copy in to our office so we can scan it into your chart.  Conditions/risks identified: recommend continue drinking at least 6-8 glasses of water.   Next appointment: Follow up in one year for your annual wellness exam.    Preventive Care 65 Years and Older, Female Preventive care refers to lifestyle choices and visits with your health care provider that can promote health and wellness. What does preventive care include?  A yearly physical exam. This is also called an annual well check.  Dental exams once or twice a year.  Routine eye exams. Ask your health care provider how often you should have your eyes checked.  Personal lifestyle choices, including:  Daily care of your teeth and gums.  Regular physical activity.  Eating a healthy diet.  Avoiding tobacco and drug use.  Limiting alcohol use.  Practicing safe sex.  Taking low-dose aspirin every day.  Taking vitamin and mineral supplements as recommended by your health care provider. What happens during an annual well check? The services and screenings  done by your health care provider during your annual well check will depend on your age, overall health, lifestyle risk factors, and family history of disease. Counseling  Your health care provider may ask you questions about your:  Alcohol use.  Tobacco use.  Drug use.  Emotional well-being.  Home and relationship well-being.  Sexual activity.  Eating habits.  History of falls.  Memory and ability to understand (cognition).  Work and work Statistician.  Reproductive health. Screening  You may have the following tests or measurements:  Height, weight, and BMI.  Blood pressure.  Lipid and cholesterol levels. These may be checked every 5 years, or more frequently if you are over 8 years old.  Skin check.  Lung cancer screening. You may have this screening every year starting at age 50 if you have a 30-pack-year history of smoking and currently smoke or have quit within the past 15 years.  Fecal occult blood test (FOBT) of the stool. You may have this test every year starting at age 15.  Flexible sigmoidoscopy or colonoscopy. You may have a sigmoidoscopy every 5 years or a colonoscopy every 10 years starting at age 31.  Hepatitis C blood test.  Hepatitis B blood test.  Sexually transmitted disease (STD) testing.  Diabetes screening. This is done by checking your blood sugar (glucose) after you have not eaten for a while (fasting). You may have this done every 1-3 years.  Bone density scan. This is done to screen for osteoporosis. You may have this done starting at age 10.  Mammogram. This may be done every 1-2 years. Talk to your health care provider about how often you should have regular mammograms. Talk  with your health care provider about your test results, treatment options, and if necessary, the need for more tests. Vaccines  Your health care provider may recommend certain vaccines, such as:  Influenza vaccine. This is recommended every year.  Tetanus,  diphtheria, and acellular pertussis (Tdap, Td) vaccine. You may need a Td booster every 10 years.  Zoster vaccine. You may need this after age 37.  Pneumococcal 13-valent conjugate (PCV13) vaccine. One dose is recommended after age 10.  Pneumococcal polysaccharide (PPSV23) vaccine. One dose is recommended after age 3. Talk to your health care provider about which screenings and vaccines you need and how often you need them. This information is not intended to replace advice given to you by your health care provider. Make sure you discuss any questions you have with your health care provider. Document Released: 06/05/2015 Document Revised: 01/27/2016 Document Reviewed: 03/10/2015 Elsevier Interactive Patient Education  2017 [place] Prevention in the Home Falls can cause injuries. They can happen to people of all ages. There are many things you can do to make your home safe and to help prevent falls. What can I do on the outside of my home?  Regularly fix the edges of walkways and driveways and fix any cracks.  Remove anything that might make you trip as you walk through a door, such as a raised step or threshold.  Trim any bushes or trees on the path to your home.  Use bright outdoor lighting.  Clear any walking paths of anything that might make someone trip, such as rocks or tools.  Regularly check to see if handrails are loose or broken. Make sure that both sides of any steps have handrails.  Any raised decks and porches should have guardrails on the edges.  Have any leaves, snow, or ice cleared regularly.  Use sand or salt on walking paths during winter.  Clean up any spills in your garage right away. This includes oil or grease spills. What can I do in the bathroom?  Use night lights.  Install grab bars by the toilet and in the tub and shower. Do not use towel bars as grab bars.  Use non-skid mats or decals in the tub or shower.  If you need to sit down in  the shower, use a plastic, non-slip stool.  Keep the floor dry. Clean up any water that spills on the floor as soon as it happens.  Remove soap buildup in the tub or shower regularly.  Attach bath mats securely with double-sided non-slip rug tape.  Do not have throw rugs and other things on the floor that can make you trip. What can I do in the bedroom?  Use night lights.  Make sure that you have a light by your bed that is easy to reach.  Do not use any sheets or blankets that are too big for your bed. They should not hang down onto the floor.  Have a firm chair that has side arms. You can use this for support while you get dressed.  Do not have throw rugs and other things on the floor that can make you trip. What can I do in the kitchen?  Clean up any spills right away.  Avoid walking on wet floors.  Keep items that you use a lot in easy-to-reach places.  If you need to reach something above you, use a strong step stool that has a grab bar.  Keep electrical cords out of the way.  Do  not use floor polish or wax that makes floors slippery. If you must use wax, use non-skid floor wax.  Do not have throw rugs and other things on the floor that can make you trip. What can I do with my stairs?  Do not leave any items on the stairs.  Make sure that there are handrails on both sides of the stairs and use them. Fix handrails that are broken or loose. Make sure that handrails are as long as the stairways.  Check any carpeting to make sure that it is firmly attached to the stairs. Fix any carpet that is loose or worn.  Avoid having throw rugs at the top or bottom of the stairs. If you do have throw rugs, attach them to the floor with carpet tape.  Make sure that you have a light switch at the top of the stairs and the bottom of the stairs. If you do not have them, ask someone to add them for you. What else can I do to help prevent falls?  Wear shoes that:  Do not have high  heels.  Have rubber bottoms.  Are comfortable and fit you well.  Are closed at the toe. Do not wear sandals.  If you use a stepladder:  Make sure that it is fully opened. Do not climb a closed stepladder.  Make sure that both sides of the stepladder are locked into place.  Ask someone to hold it for you, if possible.  Clearly mark and make sure that you can see:  Any grab bars or handrails.  First and last steps.  Where the edge of each step is.  Use tools that help you move around (mobility aids) if they are needed. These include:  Canes.  Walkers.  Scooters.  Crutches.  Turn on the lights when you go into a dark area. Replace any light bulbs as soon as they burn out.  Set up your furniture so you have a clear path. Avoid moving your furniture around.  If any of your floors are uneven, fix them.  If there are any pets around you, be aware of where they are.  Review your medicines with your doctor. Some medicines can make you feel dizzy. This can increase your chance of falling. Ask your doctor what other things that you can do to help prevent falls. This information is not intended to replace advice given to you by your health care provider. Make sure you discuss any questions you have with your health care provider. Document Released: 03/05/2009 Document Revised: 10/15/2015 Document Reviewed: 06/13/2014 Elsevier Interactive Patient Education  2017 [employer].

## 2017-08-17 NOTE — Progress Notes (Signed)
Subjective:   Sherry Davila is a 72 y.o. female who presents for Medicare Annual (Subsequent) preventive examination.  Review of Systems:  Cardiac Risk Factors include: hypertension;advanced age (>65men, >28 women);dyslipidemia;obesity (BMI >30kg/m2)     Objective:     Vitals: BP (!) 146/81 (BP Location: Left Arm, Patient Position: Sitting)   Pulse (!) 58   Temp 98.3 F (36.8 C) (Oral)   Resp 16   Ht 5\' 5"  (1.651 m)   Wt 213 lb 1.6 oz (96.7 kg)   BMI 35.46 kg/m   Body mass index is 35.46 kg/m.  Advanced Directives 08/17/2017 09/08/2016 02/05/2015 01/19/2015  Does Patient Have a Medical Advance Directive? No Yes No;Yes Yes  Type of Advance Directive - Living will [place];Living will [place];Living will  Would patient like information on creating a medical advance directive? Yes (MAU/Ambulatory/Procedural Areas - Information given) - Yes - Educational materials given -    Tobacco Social History   Tobacco Use  Smoking Status Former Smoker  . Types: Cigarettes  . Last attempt to quit: 01/19/1988  . Years since quitting: 29.5  Smokeless Tobacco Never Used     Counseling given: Not Answered   Clinical Intake:  Pre-visit preparation completed: Yes  Pain : No/denies pain     Nutritional Status: BMI > 30  Obese Nutritional Risks: None Diabetes: No  How often do you need to have someone help you when you read instructions, pamphlets, or other written materials from your doctor or pharmacy?: 1 - Never What is the last grade level you completed in school?: 12th grade  Interpreter Needed?: No  Information entered by :: Johany Hansman,LPN  Past Medical History:  Diagnosis Date  . Anxiety   . Depression   . Hyperlipidemia   . Hypertension   . Obesity   . Sleep apnea    has a cpap, doesn't wear   . Varicose vein    Past Surgical History:  Procedure Laterality Date  . COLONOSCOPY WITH PROPOFOL N/A 09/08/2016   Procedure: COLONOSCOPY WITH PROPOFOL;  Surgeon: Jonathon Bellows, MD;  Location: [facility] ENDOSCOPY;  Service: Endoscopy;  Laterality: N/A;  . FRACTURE SURGERY     foot, screws placed  . JOINT REPLACEMENT Right 2009   knee   Family History  Problem Relation Age of Onset  . Cancer Mother   . Heart failure Mother   . Heart disease Mother   . Breast cancer Mother   . AAA (abdominal aortic aneurysm) Father   . Hypertension Father   . Heart disease Father   . Hypertension Sister    Social History   Socioeconomic History  . Marital status: Married    Spouse name: Not on file  . Number of children: Not on file  . Years of education: Not on file  . Highest education level: Not on file  Occupational History  . Not on file  Social Needs  . Financial resource strain: Not hard at all  . Food insecurity:    Worry: Never true    Inability: Never true  . Transportation needs:    Medical: No    Non-medical: No  Tobacco Use  . Smoking status: Former Smoker    Types: Cigarettes    Last attempt to quit: 01/19/1988    Years since quitting: 29.5  . Smokeless tobacco: Never Used  Substance and Sexual Activity  . Alcohol use: Yes    Alcohol/week: 1.8 oz    Types: 3 Shots  of liquor per week    Comment: 3 drinks a week socially   . Drug use: No  . Sexual activity: Not on file  Lifestyle  . Physical activity:    Days per week: 3 days    Minutes per session: 30 min  . Stress: Not at all  Relationships  . Social connections:    Talks on phone: Never    Gets together: More than three times a week    Attends religious service: More than 4 times per year    Active member of club or organization: No    Attends meetings of clubs or organizations: Never    Relationship status: Married  Other Topics Concern  . Not on file  Social History Narrative  . Not on file    Outpatient Encounter Medications as of 08/17/2017  Medication Sig  . amLODipine (NORVASC) 5 MG tablet Take 1 tablet (5 mg total) by  mouth daily.  Marland Kitchen atenolol (TENORMIN) 50 MG tablet Take 1 tablet (50 mg total) by mouth daily.  Marland Kitchen atorvastatin (LIPITOR) 20 MG tablet Take 1 tablet (20 mg total) by mouth daily at 6 PM.  . FLUoxetine (PROZAC) 40 MG capsule Take 1 capsule (40 mg total) by mouth daily.  . fluticasone (FLONASE) 50 MCG/ACT nasal spray Place 2 sprays into both nostrils daily.  Marland Kitchen losartan-hydrochlorothiazide (HYZAAR) 100-12.5 MG tablet Take 1 tablet by mouth daily.  . [DISCONTINUED] amLODipine (NORVASC) 5 MG tablet Take 1 tablet (5 mg total) by mouth daily.  . [DISCONTINUED] amoxicillin-clavulanate (AUGMENTIN) 875-125 MG tablet Take 1 tablet by mouth 2 (two) times daily.  . [DISCONTINUED] atenolol (TENORMIN) 50 MG tablet Take 1 tablet (50 mg total) by mouth daily.  . [DISCONTINUED] atorvastatin (LIPITOR) 20 MG tablet Take 1 tablet (20 mg total) by mouth daily at 6 PM.  . [DISCONTINUED] FLUoxetine (PROZAC) 40 MG capsule Take 1 capsule (40 mg total) by mouth daily.  . [DISCONTINUED] losartan-hydrochlorothiazide (HYZAAR) 100-12.5 MG tablet Take 1 tablet by mouth daily.   No facility-administered encounter medications on file as of 08/17/2017.     Activities of Daily Living In your present state of health, do you have any difficulty performing the following activities: 08/17/2017  Hearing? Y  Vision? N  Difficulty concentrating or making decisions? N  Walking or climbing stairs? N  Dressing or bathing? N  Doing errands, shopping? N  Preparing Food and eating ? N  Using the Toilet? N  In the past six months, have you accidently leaked urine? N  Do you have problems with loss of bowel control? N  Managing your Medications? N  Managing your Finances? N  Housekeeping or managing your Housekeeping? N  Some recent data might be hidden    Patient Care Team: Guadalupe Maple, MD as PCP - General (Family Medicine)    Assessment:   This is a routine wellness examination for Lisel.  Exercise Activities and Dietary  recommendations Current Exercise Habits: Structured exercise class(ymca), Type of exercise: strength training/weights, Time (Minutes): 30, Frequency (Times/Week): 3, Weekly Exercise (Minutes/Week): 90, Intensity: Mild, Exercise limited by: None identified  Goals    . DIET - INCREASE WATER INTAKE     Recommend drinking at least 6-8 glasses of water a day        Fall Risk Fall Risk  08/17/2017 09/26/2016 08/15/2016 01/19/2015  Falls in the past year? No No No No   Is the patient's home free of loose throw rugs in walkways, pet beds, electrical cords,  etc?   yes      Grab bars in the bathroom? yes      Handrails on the stairs?   yes      Adequate lighting?   yes  Timed Get Up and Go performed: Completed in 8 seconds with no use of assistive devices, steady gait. No intervention needed at this time.   Depression Screen PHQ 2/9 Scores 08/17/2017 01/19/2015  PHQ - 2 Score 2 4  PHQ- 9 Score 5 -     Cognitive Function     6CIT Screen 08/17/2017  What Year? 0 points  What month? 0 points  What time? 0 points  Count back from 20 0 points  Months in reverse 0 points  Repeat phrase 0 points  Total Score 0    Immunization History  Administered Date(s) Administered  . Influenza-Unspecified 03/04/2014  . Pneumococcal Conjugate-13 01/15/2014  . Pneumococcal-Unspecified 02/28/2013  . Td 01/15/2014  . Tdap 12/08/2016  . Zoster 01/02/2012    Qualifies for Shingles Vaccine? Yes, discussed shingrix vaccine   Screening Tests Health Maintenance  Topic Date Due  . MAMMOGRAM  09/30/2018  . COLONOSCOPY  09/09/2019  . TETANUS/TDAP  12/09/2026  . INFLUENZA VACCINE  Completed  . DEXA SCAN  Completed  . Hepatitis C Screening  Completed  . PNA vac Low Risk Adult  Completed    Cancer Screenings: Lung: Low Dose CT Chest recommended if Age 44-80 years, 30 pack-year currently smoking OR have quit w/in 15years. Patient does not qualify. Breast:  Up to date on Mammogram? Yes   Up to date of  Bone Density/Dexa? Yes Colorectal: completed 09/08/2016  Additional Screenings:  Hepatitis B/HIV/Syphillis: not indicated  Hepatitis C Screening:  Completed 08/15/2016     Plan:    I have personally reviewed and addressed the Medicare Annual Wellness questionnaire and have noted the following in the patient's chart:  A. Medical and social history B. Use of alcohol, tobacco or illicit drugs  C. Current medications and supplements D. Functional ability and status E.  Nutritional status F.  Physical activity G. Advance directives H. List of other physicians I.  Hospitalizations, surgeries, and ER visits in previous 12 months J.  Rose Shiva Sahagian such as hearing and vision if needed, cognitive and depressioncrissm L. Referrals and appointments   In addition, I have reviewed and discussed with patient certain preventive protocols, quality metrics, and best practice recommendations. A written personalized care plan for preventive services as well as general preventive health recommendations were provided to patient.   Signed,  Tyler Aas, LPN Nurse Health Advisor   Nurse Notes:pateint states she only has 3 days left of her medications. She is due for a CPE with Dr.Crissman. Discussed with MD, meds refilled for 90 days. appt scheduled for CPE.

## 2017-09-28 ENCOUNTER — Ambulatory Visit (INDEPENDENT_AMBULATORY_CARE_PROVIDER_SITE_OTHER): Payer: Medicare Other | Admitting: Family Medicine

## 2017-09-28 ENCOUNTER — Encounter: Payer: Self-pay | Admitting: Family Medicine

## 2017-09-28 DIAGNOSIS — E785 Hyperlipidemia, unspecified: Secondary | ICD-10-CM

## 2017-09-28 DIAGNOSIS — F3342 Major depressive disorder, recurrent, in full remission: Secondary | ICD-10-CM

## 2017-09-28 DIAGNOSIS — G4730 Sleep apnea, unspecified: Secondary | ICD-10-CM

## 2017-09-28 DIAGNOSIS — I10 Essential (primary) hypertension: Secondary | ICD-10-CM

## 2017-09-28 DIAGNOSIS — Z0000 Encounter for general adult medical examination without abnormal findings: Secondary | ICD-10-CM | POA: Diagnosis not present

## 2017-09-28 LAB — URINALYSIS, ROUTINE W REFLEX MICROSCOPIC
Bilirubin, UA: NEGATIVE
Glucose, UA: NEGATIVE
Ketones, UA: NEGATIVE
Leukocytes, UA: NEGATIVE
Nitrite, UA: NEGATIVE
PH UA: 6.5 (ref 5.0–7.5)
PROTEIN UA: NEGATIVE
RBC, UA: NEGATIVE
Specific Gravity, UA: 1.01 (ref 1.005–1.030)
Urobilinogen, Ur: 0.2 mg/dL (ref 0.2–1.0)

## 2017-09-28 MED ORDER — ATENOLOL 50 MG PO TABS: 50.0000 mg | ORAL_TABLET | Freq: Every day | ORAL | 4 refills | Status: DC

## 2017-09-28 MED ORDER — FLUOXETINE HCL 40 MG PO CAPS: 40.0000 mg | ORAL_CAPSULE | Freq: Every day | ORAL | 4 refills | Status: DC

## 2017-09-28 MED ORDER — LOSARTAN POTASSIUM-HCTZ 100-12.5 MG PO TABS: 1.0000 | ORAL_TABLET | Freq: Every day | ORAL | 4 refills | Status: DC

## 2017-09-28 MED ORDER — AMLODIPINE BESYLATE 5 MG PO TABS: 5.0000 mg | ORAL_TABLET | Freq: Every day | ORAL | 4 refills | Status: DC

## 2017-09-28 NOTE — Assessment & Plan Note (Signed)
The current medical regimen is effective;  continue present plan and medications.  

## 2017-09-28 NOTE — Assessment & Plan Note (Signed)
Uses CPAP 

## 2017-09-28 NOTE — Progress Notes (Signed)
BP 135/81   Pulse 63   Temp 98.6 F (37 C) (Oral)   Ht 5\' 2"  (1.575 m)   Wt 211 lb 6.4 oz (95.9 kg)   SpO2 96%   BMI 38.67 kg/m    Subjective:    Patient ID: Sherry Davila, female    DOB: [birth year], 72 y.o.   MRN: [id number]  HPI: Sherry Davila is a 72 y.o. female  Chief Complaint  Patient presents with  . Annual Exam    pt had wellness exam 08/17/17 with [facility]  Patient's blood pressure doing well no complaints from medications taken faithfully without problems. Also cholesterol doing well with atorvastatin 20 mg. Fluoxetine 40 mg working well for depression with no issues or complaints.   Relevant past medical, surgical, family and social history reviewed and updated as indicated. Interim medical history since our last visit reviewed. Allergies and medications reviewed and updated.  Review of Systems  Constitutional: Negative.   HENT: Negative.   Eyes: Negative.   Respiratory: Negative.   Cardiovascular: Negative.   Gastrointestinal: Negative.   Endocrine: Negative.   Genitourinary: Negative.   Musculoskeletal: Negative.   Skin: Negative.   Allergic/Immunologic: Negative.   Neurological: Negative.   Hematological: Negative.   Psychiatric/Behavioral: Negative.     Per HPI unless specifically indicated above     Objective:    BP 135/81   Pulse 63   Temp 98.6 F (37 C) (Oral)   Ht 5\' 2"  (1.575 m)   Wt 211 lb 6.4 oz (95.9 kg)   SpO2 96%   BMI 38.67 kg/m   Wt Readings from Last 3 Encounters:  09/28/17 211 lb 6.4 oz (95.9 kg)  08/17/17 213 lb 1.6 oz (96.7 kg)  09/26/16 215 lb (97.5 kg)    Physical Exam  Constitutional: She is oriented to person, place, and time. She appears well-developed and well-nourished.  HENT:  Head: Normocephalic and atraumatic.  Right Ear: External ear normal.  Left Ear: External ear normal.  Nose: Nose normal.  Mouth/Throat: Oropharynx is clear and moist.  Eyes: Pupils are equal, round, and reactive to light.  Conjunctivae and EOM are normal.  Neck: Normal range of motion. Neck supple. Carotid bruit is not present.  Cardiovascular: Normal rate, regular rhythm and normal heart sounds.  No murmur heard. Pulmonary/Chest: Effort normal and breath sounds normal. She exhibits no mass. Right breast exhibits no mass, no skin change and no tenderness. Left breast exhibits no mass, no skin change and no tenderness. Breasts are symmetrical.  Abdominal: Soft. Bowel sounds are normal. There is no hepatosplenomegaly.  Musculoskeletal: Normal range of motion.  Neurological: She is alert and oriented to person, place, and time.  Skin: No rash noted.  Psychiatric: She has a normal mood and affect. Her behavior is normal. Judgment and thought content normal.        Assessment & Plan:   Problem List Items Addressed This Visit      Cardiovascular and Mediastinum   Hypertension    The current medical regimen is effective;  continue present plan and medications.       Relevant Medications   losartan-hydrochlorothiazide (HYZAAR) 100-12.5 MG tablet   atenolol (TENORMIN) 50 MG tablet   amLODipine (NORVASC) 5 MG tablet   Other Relevant Orders   Comprehensive metabolic panel   TSH   Urinalysis, Routine w reflex microscopic     Respiratory   Sleep apnea    Uses CPAP        Other  Hyperlipidemia    The current medical regimen is effective;  continue present plan and medications.       Relevant Medications   losartan-hydrochlorothiazide (HYZAAR) 100-12.5 MG tablet   atenolol (TENORMIN) 50 MG tablet   amLODipine (NORVASC) 5 MG tablet   Other Relevant Orders   Lipid panel   CBC with Differential/Platelet   TSH   Depression    The current medical regimen is effective;  continue present plan and medications.       Relevant Medications   FLUoxetine (PROZAC) 40 MG capsule   Other Relevant Orders   TSH       Follow up plan: Return in about 6 months (around 03/31/2018) for BMP,  Lipids, ALT,  AST.

## 2017-09-29 ENCOUNTER — Encounter: Payer: Self-pay | Admitting: Family Medicine

## 2017-09-29 LAB — CBC WITH DIFFERENTIAL/PLATELET
BASOS: 1 %
Basophils Absolute: 0.1 10*3/uL (ref 0.0–0.2)
EOS (ABSOLUTE): 0.5 10*3/uL — ABNORMAL HIGH (ref 0.0–0.4)
Eos: 7 %
HEMATOCRIT: 41.8 % (ref 34.0–46.6)
HEMOGLOBIN: 14.1 g/dL (ref 11.1–15.9)
IMMATURE GRANS (ABS): 0.1 10*3/uL (ref 0.0–0.1)
IMMATURE GRANULOCYTES: 1 %
LYMPHS: 29 %
Lymphocytes Absolute: 2 10*3/uL (ref 0.7–3.1)
MCH: 30.5 pg (ref 26.6–33.0)
MCHC: 33.7 g/dL (ref 31.5–35.7)
MCV: 91 fL (ref 79–97)
MONOCYTES: 8 %
Monocytes Absolute: 0.5 10*3/uL (ref 0.1–0.9)
Neutrophils Absolute: 3.7 10*3/uL (ref 1.4–7.0)
Neutrophils: 54 %
PLATELETS: 234 10*3/uL (ref 150–379)
RBC: 4.62 x10E6/uL (ref 3.77–5.28)
RDW: 14.6 % (ref 12.3–15.4)
WBC: 6.8 10*3/uL (ref 3.4–10.8)

## 2017-09-29 LAB — COMPREHENSIVE METABOLIC PANEL
ALBUMIN: 4.7 g/dL (ref 3.5–4.8)
ALT: 31 IU/L (ref 0–32)
AST: 25 IU/L (ref 0–40)
Albumin/Globulin Ratio: 2 (ref 1.2–2.2)
Alkaline Phosphatase: 103 IU/L (ref 39–117)
BUN / CREAT RATIO: 27 (ref 12–28)
BUN: 20 mg/dL (ref 8–27)
Bilirubin Total: 0.4 mg/dL (ref 0.0–1.2)
CALCIUM: 9.8 mg/dL (ref 8.7–10.3)
CO2: 24 mmol/L (ref 20–29)
Chloride: 102 mmol/L (ref 96–106)
Creatinine, Ser: 0.74 mg/dL (ref 0.57–1.00)
GFR, EST AFRICAN AMERICAN: 95 mL/min/{1.73_m2} (ref >59)
GFR, EST NON AFRICAN AMERICAN: 82 mL/min/{1.73_m2} (ref >59)
GLOBULIN, TOTAL: 2.3 g/dL (ref 1.5–4.5)
Glucose: 98 mg/dL (ref 65–99)
Potassium: 4.1 mmol/L (ref 3.5–5.2)
SODIUM: 142 mmol/L (ref 134–144)
TOTAL PROTEIN: 7 g/dL (ref 6.0–8.5)

## 2017-09-29 LAB — LIPID PANEL
CHOL/HDL RATIO: 2.3 ratio (ref 0.0–4.4)
Cholesterol, Total: 247 mg/dL — ABNORMAL HIGH (ref 100–199)
HDL: 109 mg/dL (ref >39)
LDL CALC: 110 mg/dL — AB (ref 0–99)
TRIGLYCERIDES: 141 mg/dL (ref 0–149)
VLDL Cholesterol Cal: 28 mg/dL (ref 5–40)

## 2017-09-29 LAB — TSH: TSH: 2.07 u[IU]/mL (ref 0.450–4.500)

## 2017-10-31 DIAGNOSIS — M25561 Pain in right knee: Secondary | ICD-10-CM | POA: Diagnosis not present

## 2017-10-31 DIAGNOSIS — M1712 Unilateral primary osteoarthritis, left knee: Secondary | ICD-10-CM | POA: Diagnosis not present

## 2017-10-31 DIAGNOSIS — M25562 Pain in left knee: Secondary | ICD-10-CM | POA: Diagnosis not present

## 2018-02-07 ENCOUNTER — Other Ambulatory Visit: Payer: Self-pay | Admitting: Family Medicine

## 2018-02-07 DIAGNOSIS — E785 Hyperlipidemia, unspecified: Secondary | ICD-10-CM

## 2018-02-15 DIAGNOSIS — Z23 Encounter for immunization: Secondary | ICD-10-CM | POA: Diagnosis not present

## 2018-04-04 DIAGNOSIS — Z1283 Encounter for screening for malignant neoplasm of skin: Secondary | ICD-10-CM | POA: Diagnosis not present

## 2018-04-04 DIAGNOSIS — L720 Epidermal cyst: Secondary | ICD-10-CM | POA: Diagnosis not present

## 2018-04-04 DIAGNOSIS — D2260 Melanocytic nevi of unspecified upper limb, including shoulder: Secondary | ICD-10-CM | POA: Diagnosis not present

## 2018-04-04 DIAGNOSIS — L820 Inflamed seborrheic keratosis: Secondary | ICD-10-CM | POA: Diagnosis not present

## 2018-04-04 DIAGNOSIS — L570 Actinic keratosis: Secondary | ICD-10-CM

## 2018-04-04 DIAGNOSIS — L578 Other skin changes due to chronic exposure to nonionizing radiation: Secondary | ICD-10-CM | POA: Diagnosis not present

## 2018-04-04 DIAGNOSIS — C44311 Basal cell carcinoma of skin of nose: Secondary | ICD-10-CM | POA: Diagnosis not present

## 2018-04-04 DIAGNOSIS — D1800 Hemangioma unspecified site: Secondary | ICD-10-CM | POA: Diagnosis not present

## 2018-04-04 DIAGNOSIS — D225 Melanocytic nevi of trunk: Secondary | ICD-10-CM | POA: Diagnosis not present

## 2018-04-04 DIAGNOSIS — L821 Other seborrheic keratosis: Secondary | ICD-10-CM | POA: Diagnosis not present

## 2018-04-04 DIAGNOSIS — C4491 Basal cell carcinoma of skin, unspecified: Secondary | ICD-10-CM

## 2018-04-04 DIAGNOSIS — D485 Neoplasm of uncertain behavior of skin: Secondary | ICD-10-CM | POA: Diagnosis not present

## 2018-04-04 HISTORY — DX: Basal cell carcinoma of skin, unspecified: C44.91

## 2018-04-04 HISTORY — DX: Actinic keratosis: L57.0

## 2018-04-09 ENCOUNTER — Encounter: Payer: Self-pay | Admitting: Family Medicine

## 2018-04-09 ENCOUNTER — Other Ambulatory Visit: Payer: Self-pay | Admitting: Family Medicine

## 2018-04-09 ENCOUNTER — Ambulatory Visit (INDEPENDENT_AMBULATORY_CARE_PROVIDER_SITE_OTHER): Payer: Medicare Other | Admitting: Family Medicine

## 2018-04-09 VITALS — BP 145/77 | HR 63 | Temp 98.4°F | Wt 216.2 lb

## 2018-04-09 DIAGNOSIS — Z1231 Encounter for screening mammogram for malignant neoplasm of breast: Secondary | ICD-10-CM

## 2018-04-09 DIAGNOSIS — F3342 Major depressive disorder, recurrent, in full remission: Secondary | ICD-10-CM | POA: Diagnosis not present

## 2018-04-09 DIAGNOSIS — Z23 Encounter for immunization: Secondary | ICD-10-CM | POA: Diagnosis not present

## 2018-04-09 DIAGNOSIS — E785 Hyperlipidemia, unspecified: Secondary | ICD-10-CM | POA: Diagnosis not present

## 2018-04-09 DIAGNOSIS — Z1239 Encounter for other screening for malignant neoplasm of breast: Secondary | ICD-10-CM | POA: Diagnosis not present

## 2018-04-09 DIAGNOSIS — I10 Essential (primary) hypertension: Secondary | ICD-10-CM

## 2018-04-09 LAB — LP+ALT+AST PICCOLO, WAIVED
ALT (SGPT) PICCOLO, WAIVED: 29 U/L (ref 10–47)
AST (SGOT) Piccolo, Waived: 28 U/L (ref 11–38)
Chol/HDL Ratio Piccolo,Waive: 2.2 mg/dL
Cholesterol Piccolo, Waived: 177 mg/dL (ref <200)
HDL Chol Piccolo, Waived: 80 mg/dL (ref >59)
LDL Chol Calc Piccolo Waived: 81 mg/dL (ref <100)
TRIGLYCERIDES PICCOLO,WAIVED: 83 mg/dL (ref <150)
VLDL CHOL CALC PICCOLO,WAIVE: 17 mg/dL (ref <30)

## 2018-04-09 MED ORDER — ATORVASTATIN CALCIUM 20 MG PO TABS: 20.0000 mg | ORAL_TABLET | Freq: Every day | ORAL | 2 refills | Status: DC

## 2018-04-09 NOTE — Progress Notes (Signed)
BP (!) 145/77   Pulse 63   Temp 98.4 F (36.9 C) (Oral)   Wt 216 lb 3.2 oz (98.1 kg)   SpO2 98%   BMI 39.54 kg/m    Subjective:    Patient ID: Sherry Davila, female    DOB: Mar 17, 1946, 72 y.o.   MRN: [id number]  HPI: Sherry Davila is a 72 y.o. female  Chief Complaint  Patient presents with  . Follow-up  . Hyperlipidemia  . Hypertension  Patient all in all doing well no complaints nerves doing well and is the patient says keeping the edge off. Blood pressure medicines taking without problems or issues. Lipitor also doing well with no problems or issues.  Relevant past medical, surgical, family and social history reviewed and updated as indicated. Interim medical history since our last visit reviewed. Allergies and medications reviewed and updated.  Review of Systems  Constitutional: Negative.   Respiratory: Negative.   Cardiovascular: Negative.     Per HPI unless specifically indicated above     Objective:    BP (!) 145/77   Pulse 63   Temp 98.4 F (36.9 C) (Oral)   Wt 216 lb 3.2 oz (98.1 kg)   SpO2 98%   BMI 39.54 kg/m   Wt Readings from Last 3 Encounters:  04/09/18 216 lb 3.2 oz (98.1 kg)  09/28/17 211 lb 6.4 oz (95.9 kg)  08/17/17 213 lb 1.6 oz (96.7 kg)    Physical Exam  Constitutional: She is oriented to person, place, and time. She appears well-developed and well-nourished.  HENT:  Head: Normocephalic and atraumatic.  Eyes: Conjunctivae and EOM are normal.  Neck: Normal range of motion.  Cardiovascular: Normal rate, regular rhythm and normal heart sounds.  Pulmonary/Chest: Effort normal and breath sounds normal.  Musculoskeletal: Normal range of motion.  Neurological: She is alert and oriented to person, place, and time.  Skin: No erythema.  Psychiatric: She has a normal mood and affect. Her behavior is normal. Judgment and thought content normal.    Results for orders placed or performed in visit on 09/28/17  Comprehensive metabolic  panel  Result Value Ref Range   Glucose 98 65 - 99 mg/dL   BUN 20 8 - 27 mg/dL   Creatinine, Ser 0.74 0.57 - 1.00 mg/dL   GFR calc non Af Amer 82 >59 mL/min/1.73   GFR calc Af Amer 95 >59 mL/min/1.73   BUN/Creatinine Ratio 27 12 - 28   Sodium 142 134 - 144 mmol/L   Potassium 4.1 3.5 - 5.2 mmol/L   Chloride 102 96 - 106 mmol/L   CO2 24 20 - 29 mmol/L   Calcium 9.8 8.7 - 10.3 mg/dL   Total Protein 7.0 6.0 - 8.5 g/dL   Albumin 4.7 3.5 - 4.8 g/dL   Globulin, Total 2.3 1.5 - 4.5 g/dL   Albumin/Globulin Ratio 2.0 1.2 - 2.2   Bilirubin Total 0.4 0.0 - 1.2 mg/dL   Alkaline Phosphatase 103 39 - 117 IU/L   AST 25 0 - 40 IU/L   ALT 31 0 - 32 IU/L  Lipid panel  Result Value Ref Range   Cholesterol, Total 247 (H) 100 - 199 mg/dL   Triglycerides 141 0 - 149 mg/dL   HDL 109 >39 mg/dL   VLDL Cholesterol Cal 28 5 - 40 mg/dL   LDL Calculated 110 (H) 0 - 99 mg/dL   Chol/HDL Ratio 2.3 0.0 - 4.4 ratio  CBC with Differential/Platelet  Result Value Ref Range  WBC 6.8 3.4 - 10.8 x10E3/uL   RBC 4.62 3.77 - 5.28 x10E6/uL   Hemoglobin 14.1 11.1 - 15.9 g/dL   Hematocrit 41.8 34.0 - 46.6 %   MCV 91 79 - 97 fL   MCH 30.5 26.6 - 33.0 pg   MCHC 33.7 31.5 - 35.7 g/dL   RDW 14.6 12.3 - 15.4 %   Platelets 234 150 - 379 x10E3/uL   Neutrophils 54 Not Estab. %   Lymphs 29 Not Estab. %   Monocytes 8 Not Estab. %   Eos 7 Not Estab. %   Basos 1 Not Estab. %   Neutrophils Absolute 3.7 1.4 - 7.0 x10E3/uL   Lymphocytes Absolute 2.0 0.7 - 3.1 x10E3/uL   Monocytes Absolute 0.5 0.1 - 0.9 x10E3/uL   EOS (ABSOLUTE) 0.5 (H) 0.0 - 0.4 x10E3/uL   Basophils Absolute 0.1 0.0 - 0.2 x10E3/uL   Immature Granulocytes 1 Not Estab. %   Immature Grans (Abs) 0.1 0.0 - 0.1 x10E3/uL  TSH  Result Value Ref Range   TSH 2.070 0.450 - 4.500 uIU/mL  Urinalysis, Routine w reflex microscopic  Result Value Ref Range   Specific Gravity, UA 1.010 1.005 - 1.030   pH, UA 6.5 5.0 - 7.5   Color, UA Yellow Yellow   Appearance Ur  Clear Clear   Leukocytes, UA Negative Negative   Protein, UA Negative Negative/Trace   Glucose, UA Negative Negative   Ketones, UA Negative Negative   RBC, UA Negative Negative   Bilirubin, UA Negative Negative   Urobilinogen, Ur 0.2 0.2 - 1.0 mg/dL   Nitrite, UA Negative Negative      Assessment & Plan:   Problem List Items Addressed This Visit      Cardiovascular and Mediastinum   Hypertension - Primary    The current medical regimen is effective;  continue present plan and medications.       Relevant Medications   atorvastatin (LIPITOR) 20 MG tablet   Other Relevant Orders   Basic metabolic panel     Other   Hyperlipidemia    The current medical regimen is effective;  continue present plan and medications.       Relevant Medications   atorvastatin (LIPITOR) 20 MG tablet   Other Relevant Orders   LP+ALT+AST Piccol[place], Waived   Depression    The current medical regimen is effective;  continue present plan and medications.        Other Visit Diagnoses    Needs flu shot       Breast cancer screening       Relevant Orders   [facility] MAMMOGRAPHY       Follow up plan: Return in about 6 months (around 10/08/2018) for Physical Exam.

## 2018-04-09 NOTE — Assessment & Plan Note (Signed)
The current medical regimen is effective;  continue present plan and medications.  

## 2018-04-10 ENCOUNTER — Encounter: Payer: Self-pay | Admitting: Family Medicine

## 2018-04-10 LAB — BASIC METABOLIC PANEL
BUN / CREAT RATIO: 38 — AB (ref 12–28)
BUN: 22 mg/dL (ref 8–27)
CHLORIDE: 104 mmol/L (ref 96–106)
CO2: 21 mmol/L (ref 20–29)
Calcium: 8.8 mg/dL (ref 8.7–10.3)
Creatinine, Ser: 0.58 mg/dL (ref 0.57–1.00)
GFR calc Af Amer: 108 mL/min/{1.73_m2} (ref >59)
GFR calc non Af Amer: 94 mL/min/{1.73_m2} (ref >59)
GLUCOSE: 84 mg/dL (ref 65–99)
POTASSIUM: 4.4 mmol/L (ref 3.5–5.2)
SODIUM: 140 mmol/L (ref 134–144)

## 2018-04-11 ENCOUNTER — Ambulatory Visit
Admission: RE | Admit: 2018-04-11 | Discharge: 2018-04-11 | Disposition: A | Discharge status: Home / Self Care | Payer: Medicare Other | Source: Ambulatory Visit | Attending: Family Medicine | Admitting: Family Medicine

## 2018-04-11 DIAGNOSIS — Z1231 Encounter for screening mammogram for malignant neoplasm of breast: Secondary | ICD-10-CM | POA: Insufficient documentation

## 2018-05-29 DIAGNOSIS — L570 Actinic keratosis: Secondary | ICD-10-CM | POA: Diagnosis not present

## 2018-05-29 DIAGNOSIS — L820 Inflamed seborrheic keratosis: Secondary | ICD-10-CM | POA: Diagnosis not present

## 2018-05-29 DIAGNOSIS — C44311 Basal cell carcinoma of skin of nose: Secondary | ICD-10-CM | POA: Diagnosis not present

## 2018-08-06 DIAGNOSIS — C44311 Basal cell carcinoma of skin of nose: Secondary | ICD-10-CM | POA: Diagnosis not present

## 2018-08-06 DIAGNOSIS — Z85828 Personal history of other malignant neoplasm of skin: Secondary | ICD-10-CM | POA: Insufficient documentation

## 2018-08-22 ENCOUNTER — Telehealth: Payer: Self-pay

## 2018-08-22 ENCOUNTER — Ambulatory Visit: Payer: Medicare Other

## 2018-08-22 NOTE — Telephone Encounter (Signed)
Copied from [place] [phone]. Topic: General - Other >> Aug 22, 2018 11:57 AM Valla Leaver wrote: Reason for CRM: Patient returning missed call abou r/s new t appt with Scott.

## 2018-08-23 NOTE — Telephone Encounter (Signed)
Pt is calling about getting rescheduled for her new pt appt.  She wants a call back as soon as possible.  She called yesterday and has not gotten a call back yet

## 2018-08-24 ENCOUNTER — Ambulatory Visit: Payer: Medicare Other | Admitting: Internal Medicine

## 2018-08-29 ENCOUNTER — Telehealth: Payer: Self-pay | Admitting: *Deleted

## 2018-08-29 ENCOUNTER — Telehealth: Payer: Self-pay

## 2018-08-29 NOTE — Telephone Encounter (Signed)
LMTCB

## 2018-08-29 NOTE — Telephone Encounter (Signed)
Copied from [place] [phone]. Topic: General - Other >> Aug 22, 2018 11:57 AM Valla Leaver wrote: Reason for CRM: Patient returning missed call abou r/s new t appt with Scott. >> Aug 29, 2018 12:53 PM Mcneil, Ja-Kwan wrote: Pt stated she received a call asking her to contact the office to reschedule appt with Dr. Nicki Reaper. Pt requests call back. Cb# [phone]

## 2018-08-29 NOTE — Telephone Encounter (Signed)
Copied from [place] [phone]. Topic: General - Other >> Aug 22, 2018 11:57 AM Valla Leaver wrote: Reason for CRM: Patient returning missed call abou r/s new t appt with Scott. >> Aug 29, 2018 12:53 PM Mcneil, Ja-Kwan wrote: Pt stated she received a call asking her to contact the office to reschedule appt with Dr. Nicki Reaper. Pt requests call back. Cb# [phone]

## 2018-09-03 NOTE — Telephone Encounter (Signed)
Pt scheduled on 10/26/18

## 2018-10-05 ENCOUNTER — Other Ambulatory Visit: Payer: Self-pay | Admitting: Family Medicine

## 2018-10-05 DIAGNOSIS — I10 Essential (primary) hypertension: Secondary | ICD-10-CM

## 2018-10-05 NOTE — Telephone Encounter (Signed)
Requested Prescriptions  Pending Prescriptions Disp Refills  . losartan-hydrochlorothiazide (HYZAAR) 100-12.5 MG tablet [Pharmacy Med Name: LOSARTAN-HCTZ 100-12.5 MG TAB] 90 tablet 0    Sig: TAKE ONE TABLET BY MOUTH DAILY     Cardiovascular: ARB + Diuretic Combos Failed - 10/05/2018  2:52 PM      Failed - Last BP in normal range    BP Readings from Last 1 Encounters:  04/09/18 (!) 145/77         Passed - K in normal range and within 180 days    Potassium  Date Value Ref Range Status  04/09/2018 4.4 3.5 - 5.2 mmol/L Final         Passed - Na in normal range and within 180 days    Sodium  Date Value Ref Range Status  04/09/2018 140 134 - 144 mmol/L Final         Passed - Cr in normal range and within 180 days    Creatinine, Ser  Date Value Ref Range Status  04/09/2018 0.58 0.57 - 1.00 mg/dL Final         Passed - Ca in normal range and within 180 days    Calcium  Date Value Ref Range Status  04/09/2018 8.8 8.7 - 10.3 mg/dL Final         Passed - Patient is not pregnant      Passed - Valid encounter within last 6 months    Recent Outpatient Visits          5 months ago Essential hypertension   [place], Jeannette How, MD   1 year ago Essential hypertension   [place], Jeannette How, MD   2 years ago Acute sinusitis, recurrence not specified, unspecified location   [facility], Jeannette How, MD   2 years ago Annual physical exam   [facility], Jeannette How, MD   3 years ago Essential hypertension   [place], Jeannette How, MD      Future Appointments            In 3 weeks Einar Pheasant, MD [place]         . amLODipine (NORVASC) 5 MG tablet [Pharmacy Med Name: amLODIPine BESYLATE 5 MG TAB] 90 tablet 0    Sig: TAKE ONE TABLET BY MOUTH DAILY     Cardiovascular:  Calcium Channel Blockers Failed - 10/05/2018  2:52 PM      Failed - Last BP in normal  range    BP Readings from Last 1 Encounters:  04/09/18 (!) 145/77         Passed - Valid encounter within last 6 months    Recent Outpatient Visits          5 months ago Essential hypertension   [place], Jeannette How, MD   1 year ago Essential hypertension   [facility] Cr[facility]man, Jeannette How, MD   2 years ago Acute sinusitis, recurrence not specified, unspecified location   [facility] Cr[facility], Jeannette How, MD   2 years ago Annual physical exam   [facility], Jeannette How, MD   3 years ago Essential hypertension   [place], Jeannette How, MD      Future Appointments            In 3 weeks Einar Pheasant, MD [facility], [facility]

## 2018-10-09 ENCOUNTER — Other Ambulatory Visit: Payer: Self-pay

## 2018-10-09 DIAGNOSIS — I10 Essential (primary) hypertension: Secondary | ICD-10-CM

## 2018-10-09 MED ORDER — ATENOLOL 50 MG PO TABS: 50.0000 mg | ORAL_TABLET | Freq: Every day | ORAL | 3 refills | Status: DC

## 2018-10-10 DIAGNOSIS — Z85828 Personal history of other malignant neoplasm of skin: Secondary | ICD-10-CM | POA: Diagnosis not present

## 2018-10-10 DIAGNOSIS — L570 Actinic keratosis: Secondary | ICD-10-CM | POA: Diagnosis not present

## 2018-10-10 DIAGNOSIS — L820 Inflamed seborrheic keratosis: Secondary | ICD-10-CM | POA: Diagnosis not present

## 2018-10-10 DIAGNOSIS — L578 Other skin changes due to chronic exposure to nonionizing radiation: Secondary | ICD-10-CM | POA: Diagnosis not present

## 2018-10-24 ENCOUNTER — Ambulatory Visit: Payer: Medicare Other | Admitting: Family Medicine

## 2018-10-25 ENCOUNTER — Telehealth: Payer: Self-pay | Admitting: *Deleted

## 2018-10-25 NOTE — Telephone Encounter (Signed)
Copied from [place] [id number]. Topic: Appointment Scheduling - Scheduling Inquiry for Clinic >> Oct 25, 2018  3:59 PM Erick Blinks wrote: Reason for CRM: Pt wants to change appt to in person.

## 2018-10-26 ENCOUNTER — Ambulatory Visit (INDEPENDENT_AMBULATORY_CARE_PROVIDER_SITE_OTHER): Payer: Medicare Other | Admitting: Internal Medicine

## 2018-10-26 ENCOUNTER — Encounter: Payer: Self-pay | Admitting: Internal Medicine

## 2018-10-26 DIAGNOSIS — I10 Essential (primary) hypertension: Secondary | ICD-10-CM | POA: Diagnosis not present

## 2018-10-26 DIAGNOSIS — K5900 Constipation, unspecified: Secondary | ICD-10-CM

## 2018-10-26 DIAGNOSIS — G4730 Sleep apnea, unspecified: Secondary | ICD-10-CM | POA: Diagnosis not present

## 2018-10-26 DIAGNOSIS — F320 Major depressive disorder, single episode, mild: Secondary | ICD-10-CM | POA: Diagnosis not present

## 2018-10-26 DIAGNOSIS — E785 Hyperlipidemia, unspecified: Secondary | ICD-10-CM | POA: Diagnosis not present

## 2018-10-26 DIAGNOSIS — F32A Depression, unspecified: Secondary | ICD-10-CM

## 2018-10-26 NOTE — Progress Notes (Signed)
Patient ID: Sherry Davila, female   DOB: March 31, 1946, 73 y.o.   MRN: [id number]   Virtual Visit via video Note  This visit type was conducted due to national recommendations for restrictions regarding the COVID-19 pandemic (e.g. social distancing).  This format is felt to be most appropriate for this patient at this time.  All issues noted in this document were discussed and addressed.  No physical exam was performed (except for noted visual exam findings with Video Visits).   I connected with Sherry Davila today by a video enabled telemedicine application and verified that I am speaking with the correct person using two identifiers. Location patient: home Location provider: work Persons participating in the virtual visit: patient, provider  I discussed the limitations, risks, security and privacy concerns of performing an evaluation and management service by video and the availability of in person appointments. The patient expressed understanding and agreed to proceed.   Reason for visit: establish care.    HPI: Previous pt of Dr Joana Reamer.  Transferring care here.  Has a history of hypertension and hypercholesterolemia.  Also has a history of depression.  Controlled on prozac.  Stays active.  No chest pain.  No sob.  No acid reflux.  No abdominal pain.  No urine change.  Some issues with constipation.  Taking fiber.  Discussed miralax daily.  Some occasional problems with hemorrhoids.  None currently.  Has known sleep apnea.  Using cpap every night.  Has a history of one abnormal pap smear around age 53.  All pap smears since have been normal.  Previously smoked.  Quit at age 36.  Trying to stay in secondary to COVID restrictions.  No fever.  No chest congestion or cough.  Overall feels good.     ROS: See pertinent positives and negatives per HPI.  Past Medical History:  Diagnosis Date  . Anxiety   . Depression   . Hyperlipidemia   . Hypertension   . Obesity   . Sleep apnea    has a  cpap, doesn't wear   . Varicose vein     Past Surgical History:  Procedure Laterality Date  . COLONOSCOPY WITH PROPOFOL N/A 09/08/2016   Procedure: COLONOSCOPY WITH PROPOFOL;  Surgeon: Jonathon Bellows, MD;  Location: [facility] ENDOSCOPY;  Service: Endoscopy;  Laterality: N/A;  . FRACTURE SURGERY     foot, screws placed  . JOINT REPLACEMENT Right 2009   knee    Family History  Problem Relation Age of Onset  . Cancer Mother   . Heart failure Mother   . Heart disease Mother   . Breast cancer Mother 63  . AAA (abdominal aortic aneurysm) Father   . Hypertension Father   . Heart disease Father   . Hypertension Sister     SOCIAL HX: reviewed.    Current Outpatient Medications:  .  amLODipine (NORVASC) 5 MG tablet, TAKE ONE TABLET BY MOUTH DAILY, Disp: 90 tablet, Rfl: 0 .  atenolol (TENORMIN) 50 MG tablet, Take 1 tablet (50 mg total) by mouth daily., Disp: 90 tablet, Rfl: 3 .  atorvastatin (LIPITOR) 20 MG tablet, Take 1 tablet (20 mg total) by mouth daily at 6 PM., Disp: 90 tablet, Rfl: 2 .  FLUoxetine (PROZAC) 40 MG capsule, Take 1 capsule (40 mg total) by mouth daily., Disp: 90 capsule, Rfl: 4 .  losartan-hydrochlorothiazide (HYZAAR) 100-12.5 MG tablet, TAKE ONE TABLET BY MOUTH DAILY, Disp: 90 tablet, Rfl: 0  EXAM:  GENERAL: alert, oriented, appears well and in no  acute distress  HEENT: atraumatic, conjunttiva clear, no obvious abnormalities on inspection of external nose and ears  NECK: normal movements of the head and neck  LUNGS: on inspection no signs of respiratory distress, breathing rate appears normal, no obvious gross SOB, gasping or wheezing  CV: no obvious cyanosis  PSYCH/NEURO: pleasant and cooperative, no obvious depression or anxiety, speech and thought processing grossly intact  ASSESSMENT AND PLAN:  Discussed the following assessment and plan:  Hyperlipidemia, unspecified hyperlipidemia type - Plan: Hepatic function panel, Lipid panel  Essential hypertension -  Plan: CBC with Differential/Platelet, Basic metabolic panel  Mild depression (HCC) - Plan: TSH  Sleep apnea, unspecified type  Constipation, unspecified constipation type  Hyperlipidemia Low cholesterol diet and exercise.  On lipitor.  Follow lipid panel and liver function tests.   Hypertension On losartan, hctz, atenolol and amlodipine.  Follow pressures.  Follow metabolic panel.   Mild depression ([place]) Doing well on prozac.  Follow.    Sleep apnea Using cpap regularly.  Follow.    Constipation Discussed taking miralax daily.  Follow.      I discussed the assessment and treatment plan with the patient. The patient was provided an opportunity to ask questions and all were answered. The patient agreed with the plan and demonstrated an understanding of the instructions.   The patient was advised to call back or seek an in-person evaluation if the symptoms worsen or if the condition fails to improve as anticipated.    Einar Pheasant, MD

## 2018-10-26 NOTE — Telephone Encounter (Signed)
DOXY appt schedule

## 2018-10-26 NOTE — Telephone Encounter (Signed)
Do you mind calling her? She has an appt today. I think you had been trying to reach her.

## 2018-10-28 ENCOUNTER — Encounter: Payer: Self-pay | Admitting: Internal Medicine

## 2018-10-28 DIAGNOSIS — K5900 Constipation, unspecified: Secondary | ICD-10-CM | POA: Insufficient documentation

## 2018-10-28 NOTE — Assessment & Plan Note (Signed)
Using cpap regularly.  Follow.  

## 2018-10-28 NOTE — Assessment & Plan Note (Signed)
Discussed taking miralax daily.  Follow.  

## 2018-10-28 NOTE — Assessment & Plan Note (Signed)
On losartan, hctz, atenolol and amlodipine.  Follow pressures.  Follow metabolic panel.

## 2018-10-28 NOTE — Assessment & Plan Note (Signed)
Doing well on prozac.  Follow  

## 2018-10-28 NOTE — Assessment & Plan Note (Signed)
Low cholesterol diet and exercise.  On lipitor.  Follow lipid panel and liver function tests.   

## 2018-12-07 DIAGNOSIS — C44311 Basal cell carcinoma of skin of nose: Secondary | ICD-10-CM | POA: Diagnosis not present

## 2018-12-26 ENCOUNTER — Other Ambulatory Visit (INDEPENDENT_AMBULATORY_CARE_PROVIDER_SITE_OTHER): Payer: Medicare Other

## 2018-12-26 ENCOUNTER — Other Ambulatory Visit: Payer: Self-pay

## 2018-12-26 DIAGNOSIS — F32A Depression, unspecified: Secondary | ICD-10-CM

## 2018-12-26 DIAGNOSIS — E785 Hyperlipidemia, unspecified: Secondary | ICD-10-CM | POA: Diagnosis not present

## 2018-12-26 DIAGNOSIS — I10 Essential (primary) hypertension: Secondary | ICD-10-CM | POA: Diagnosis not present

## 2018-12-26 DIAGNOSIS — F320 Major depressive disorder, single episode, mild: Secondary | ICD-10-CM

## 2018-12-26 LAB — CBC WITH DIFFERENTIAL/PLATELET
Basophils Absolute: 0.1 10*3/uL (ref 0.0–0.1)
Basophils Relative: 1.1 % (ref 0.0–3.0)
Eosinophils Absolute: 0.3 10*3/uL (ref 0.0–0.7)
Eosinophils Relative: 6.4 % — ABNORMAL HIGH (ref 0.0–5.0)
HCT: 43.4 % (ref 36.0–46.0)
Hemoglobin: 14.3 g/dL (ref 12.0–15.0)
Lymphocytes Relative: 30.2 % (ref 12.0–46.0)
Lymphs Abs: 1.6 10*3/uL (ref 0.7–4.0)
MCHC: 32.9 g/dL (ref 30.0–36.0)
MCV: 91.8 fl (ref 78.0–100.0)
Monocytes Absolute: 0.5 10*3/uL (ref 0.1–1.0)
Monocytes Relative: 10 % (ref 3.0–12.0)
Neutro Abs: 2.8 10*3/uL (ref 1.4–7.7)
Neutrophils Relative %: 52.3 % (ref 43.0–77.0)
Platelets: 247 10*3/uL (ref 150.0–400.0)
RBC: 4.73 Mil/uL (ref 3.87–5.11)
RDW: 13.8 % (ref 11.5–15.5)
WBC: 5.3 10*3/uL (ref 4.0–10.5)

## 2018-12-26 LAB — BASIC METABOLIC PANEL
BUN: 20 mg/dL (ref 6–23)
CO2: 28 mEq/L (ref 19–32)
Calcium: 9.6 mg/dL (ref 8.4–10.5)
Chloride: 102 mEq/L (ref 96–112)
Creatinine, Ser: 0.57 mg/dL (ref 0.40–1.20)
GFR: 104.36 mL/min (ref >60.00)
Glucose, Bld: 102 mg/dL — ABNORMAL HIGH (ref 70–99)
Potassium: 4 mEq/L (ref 3.5–5.1)
Sodium: 138 mEq/L (ref 135–145)

## 2018-12-26 LAB — LIPID PANEL
Cholesterol: 238 mg/dL — ABNORMAL HIGH (ref 0–200)
HDL: 101.5 mg/dL (ref >39.00)
LDL Cholesterol: 121 mg/dL — ABNORMAL HIGH (ref 0–99)
NonHDL: 136.4
Total CHOL/HDL Ratio: 2
Triglycerides: 79 mg/dL (ref 0.0–149.0)
VLDL: 15.8 mg/dL (ref 0.0–40.0)

## 2018-12-26 LAB — HEPATIC FUNCTION PANEL
ALT: 15 U/L (ref 0–35)
AST: 14 U/L (ref 0–37)
Albumin: 4.5 g/dL (ref 3.5–5.2)
Alkaline Phosphatase: 102 U/L (ref 39–117)
Bilirubin, Direct: 0.1 mg/dL (ref 0.0–0.3)
Total Bilirubin: 0.6 mg/dL (ref 0.2–1.2)
Total Protein: 6.8 g/dL (ref 6.0–8.3)

## 2018-12-26 LAB — TSH: TSH: 1.88 u[IU]/mL (ref 0.35–4.50)

## 2019-01-02 ENCOUNTER — Other Ambulatory Visit: Payer: Self-pay

## 2019-01-02 DIAGNOSIS — E785 Hyperlipidemia, unspecified: Secondary | ICD-10-CM

## 2019-01-02 MED ORDER — ATORVASTATIN CALCIUM 40 MG PO TABS: 40.0000 mg | ORAL_TABLET | Freq: Every day | ORAL | 1 refills | Status: DC

## 2019-01-04 ENCOUNTER — Other Ambulatory Visit: Payer: Self-pay | Admitting: Family Medicine

## 2019-01-04 DIAGNOSIS — F3342 Major depressive disorder, recurrent, in full remission: Secondary | ICD-10-CM

## 2019-01-04 DIAGNOSIS — I10 Essential (primary) hypertension: Secondary | ICD-10-CM

## 2019-01-08 ENCOUNTER — Other Ambulatory Visit: Payer: Self-pay | Admitting: Family Medicine

## 2019-01-08 DIAGNOSIS — F3342 Major depressive disorder, recurrent, in full remission: Secondary | ICD-10-CM

## 2019-01-08 NOTE — Telephone Encounter (Signed)
Routing to provider  

## 2019-01-08 NOTE — Telephone Encounter (Signed)
Please advise last filled x 1 year agoc

## 2019-01-22 DIAGNOSIS — Z23 Encounter for immunization: Secondary | ICD-10-CM | POA: Diagnosis not present

## 2019-02-12 ENCOUNTER — Encounter: Payer: Self-pay | Admitting: Internal Medicine

## 2019-03-01 ENCOUNTER — Encounter: Payer: Medicare Other | Admitting: Internal Medicine

## 2019-03-26 ENCOUNTER — Telehealth: Payer: Self-pay

## 2019-03-26 NOTE — Telephone Encounter (Signed)
Copied from [place] [id number]. Topic: Appointment Scheduling - Scheduling Inquiry for Clinic >> Mar 26, 2019  4:32 PM Sheran Luz wrote: Patient calling to schedule AWV

## 2019-04-16 ENCOUNTER — Encounter: Payer: Medicare Other | Admitting: Internal Medicine

## 2019-04-23 ENCOUNTER — Encounter: Payer: Medicare Other | Admitting: Internal Medicine

## 2019-06-21 ENCOUNTER — Other Ambulatory Visit: Payer: Self-pay | Admitting: Internal Medicine

## 2019-06-21 DIAGNOSIS — Z1231 Encounter for screening mammogram for malignant neoplasm of breast: Secondary | ICD-10-CM

## 2019-06-29 ENCOUNTER — Other Ambulatory Visit: Payer: Self-pay | Admitting: Internal Medicine

## 2019-07-03 ENCOUNTER — Other Ambulatory Visit: Payer: Self-pay | Admitting: Internal Medicine

## 2019-07-03 ENCOUNTER — Other Ambulatory Visit: Payer: Self-pay | Admitting: Family Medicine

## 2019-07-03 DIAGNOSIS — I10 Essential (primary) hypertension: Secondary | ICD-10-CM

## 2019-07-05 ENCOUNTER — Other Ambulatory Visit: Payer: Self-pay | Admitting: Family Medicine

## 2019-07-05 DIAGNOSIS — I10 Essential (primary) hypertension: Secondary | ICD-10-CM

## 2019-07-05 NOTE — Telephone Encounter (Signed)
Requested medication (s) are due for refill today: yes  Requested medication (s) are on the active medication list: yes  Last refill:  10/05/2018  Future visit scheduled: no  Notes to clinic:  review for refill Last filled by a different provider    Requested Prescriptions  Pending Prescriptions Disp Refills   losartan-hydrochlorothiazide (HYZAAR) 100-12.5 MG tablet [Pharmacy Med Name: LOSARTAN-HCTZ 100-12.5 MG TAB] 90 tablet 0    Sig: TAKE ONE TABLET BY MOUTH DAILY      Cardiovascular: ARB + Diuretic Combos Failed - 07/05/2019 11:41 AM      Failed - K in normal range and within 180 days    Potassium  Date Value Ref Range Status  12/26/2018 4.0 3.5 - 5.1 mEq/L Final          Failed - Na in normal range and within 180 days    Sodium  Date Value Ref Range Status  12/26/2018 138 135 - 145 mEq/L Final  04/09/2018 140 134 - 144 mmol/L Final          Failed - Cr in normal range and within 180 days    Creatinine, Ser  Date Value Ref Range Status  12/26/2018 0.57 0.40 - 1.20 mg/dL Final          Failed - Ca in normal range and within 180 days    Calcium  Date Value Ref Range Status  12/26/2018 9.6 8.4 - 10.5 mg/dL Final          Failed - Last BP in normal range    BP Readings from Last 1 Encounters:  04/09/18 (!) 145/77          Failed - Valid encounter within last 6 months    Recent Outpatient Visits           1 year ago Essential hypertension   [place], Jeannette How, MD   1 year ago Essential hypertension   [place], Jeannette How, MD   2 years ago Acute sinusitis, recurrence not specified, unspecified location   [place], Jeannette How, MD   2 years ago Annual physical exam   [place] Guadalupe Maple, MD   4 years ago Essential hypertension   [place], Jeannette How, MD              Passed - Patient is not pregnant

## 2019-07-15 ENCOUNTER — Other Ambulatory Visit: Payer: Self-pay

## 2019-07-15 DIAGNOSIS — I10 Essential (primary) hypertension: Secondary | ICD-10-CM

## 2019-07-15 MED ORDER — ATENOLOL 50 MG PO TABS: 50.0000 mg | ORAL_TABLET | Freq: Every day | ORAL | 0 refills | Status: DC

## 2019-07-15 NOTE — Telephone Encounter (Signed)
[facility] fax Rx refill on atenolol 50 mg tab

## 2019-07-16 ENCOUNTER — Telehealth: Payer: Self-pay | Admitting: Internal Medicine

## 2019-07-16 DIAGNOSIS — I10 Essential (primary) hypertension: Secondary | ICD-10-CM

## 2019-07-16 MED ORDER — AMLODIPINE BESYLATE 5 MG PO TABS: 5.0000 mg | ORAL_TABLET | Freq: Every day | ORAL | 1 refills | Status: DC

## 2019-07-16 NOTE — Addendum Note (Signed)
Addended byElpidio Galea T on: 07/16/2019 11:33 AM   Modules accepted: Orders

## 2019-07-16 NOTE — Telephone Encounter (Signed)
Medication sent to [facility]

## 2019-07-16 NOTE — Telephone Encounter (Signed)
Please send medication to [facility]

## 2019-07-16 NOTE — Telephone Encounter (Signed)
Pt needs refill on amLODipine (NORVASC) 5 MG tablet sent to [facility]

## 2019-08-19 ENCOUNTER — Telehealth: Payer: Self-pay | Admitting: Internal Medicine

## 2019-08-19 MED ORDER — ATORVASTATIN CALCIUM 40 MG PO TABS: ORAL_TABLET | ORAL | 0 refills | Status: DC

## 2019-08-19 NOTE — Telephone Encounter (Signed)
Pt is completely out of atorvastatin. She needs it sent to [facility].

## 2019-08-22 ENCOUNTER — Other Ambulatory Visit: Payer: Self-pay | Admitting: Family Medicine

## 2019-08-22 DIAGNOSIS — I10 Essential (primary) hypertension: Secondary | ICD-10-CM

## 2019-08-27 ENCOUNTER — Other Ambulatory Visit: Payer: Self-pay | Admitting: Family Medicine

## 2019-08-27 DIAGNOSIS — I10 Essential (primary) hypertension: Secondary | ICD-10-CM

## 2019-09-30 ENCOUNTER — Ambulatory Visit
Admission: RE | Admit: 2019-09-30 | Discharge: 2019-09-30 | Disposition: A | Discharge status: Home / Self Care | Payer: Medicare Other | Source: Ambulatory Visit | Attending: Internal Medicine | Admitting: Internal Medicine

## 2019-09-30 DIAGNOSIS — Z1231 Encounter for screening mammogram for malignant neoplasm of breast: Secondary | ICD-10-CM | POA: Diagnosis not present

## 2019-10-01 ENCOUNTER — Encounter: Payer: Self-pay | Admitting: Internal Medicine

## 2019-10-01 DIAGNOSIS — Z0000 Encounter for general adult medical examination without abnormal findings: Secondary | ICD-10-CM | POA: Insufficient documentation

## 2019-10-08 ENCOUNTER — Telehealth: Payer: Self-pay | Admitting: Internal Medicine

## 2019-10-08 DIAGNOSIS — I10 Essential (primary) hypertension: Secondary | ICD-10-CM

## 2019-10-08 MED ORDER — ATENOLOL 50 MG PO TABS: ORAL_TABLET | ORAL | 0 refills | Status: DC

## 2019-10-08 NOTE — Telephone Encounter (Signed)
Pt needs a refill on atenolol (TENORMIN) 50 MG tablet sent to [facility]

## 2019-10-08 NOTE — Addendum Note (Signed)
Addended by: Elpidio Galea T on: 10/08/2019 10:51 AM   Modules accepted: Orders

## 2019-10-09 ENCOUNTER — Ambulatory Visit (INDEPENDENT_AMBULATORY_CARE_PROVIDER_SITE_OTHER): Payer: Medicare Other

## 2019-10-09 VITALS — Ht 66.0 in | Wt 216.0 lb

## 2019-10-09 DIAGNOSIS — Z0000 Encounter for general adult medical examination without abnormal findings: Secondary | ICD-10-CM

## 2019-10-09 DIAGNOSIS — Z1211 Encounter for screening for malignant neoplasm of colon: Secondary | ICD-10-CM | POA: Diagnosis not present

## 2019-10-09 NOTE — Progress Notes (Addendum)
Subjective:   Sherry Davila is a 74 y.o. female who presents for Medicare Annual (Subsequent) preventive examination.  Review of Systems:  No ROS.  Medicare Wellness Virtual Visit.  Visual/audio telehealth visit, UTA vital signs.   Ht/Wt provided.  See social history for additional risk factors.   Cardiac Risk Factors include: advanced age (>2men, >11 women);hypertension     Objective:     Vitals: Ht 5\' 6"  (1.676 m)   Wt 216 lb (98 kg)   BMI 34.86 kg/m   Body mass index is 34.86 kg/m.  Advanced Directives 10/09/2019 08/17/2017 09/08/2016 02/05/2015 01/19/2015  Does Patient Have a Medical Advance Directive? Yes No Yes No;Yes Yes  Type of Paramedic of [place];Living will - Living will [place];Living will [place];Living will  Does patient want to make changes to medical advance directive? No - Patient declined - - - -  Copy of [place] in Chart? No - copy requested - - - -  Would patient like information on creating a medical advance directive? - Yes (MAU/Ambulatory/Procedural Areas - Information given) - Yes - Educational materials given -    Tobacco Social History   Tobacco Use  Smoking Status Former Smoker  . Types: Cigarettes  . Quit date: 01/19/1988  . Years since quitting: 31.7  Smokeless Tobacco Never Used     Counseling given: Not Answered   Clinical Intake:  Pre-visit preparation completed: Yes        Diabetes: No  How often do you need to have someone help you when you read instructions, pamphlets, or other written materials from your doctor or pharmacy?: 1 - Never  Interpreter Needed?: No     Past Medical History:  Diagnosis Date  . Anxiety   . Depression   . Hyperlipidemia   . Hypertension   . Obesity   . Sleep apnea    has a cpap, doesn't wear   . Varicose vein    Past Surgical History:  Procedure Laterality Date  . COLONOSCOPY WITH PROPOFOL N/A  09/08/2016   Procedure: COLONOSCOPY WITH PROPOFOL;  Surgeon: Jonathon Bellows, MD;  Location: [facility] ENDOSCOPY;  Service: Endoscopy;  Laterality: N/A;  . FRACTURE SURGERY     foot, screws placed  . JOINT REPLACEMENT Right 2009   knee   Family History  Problem Relation Age of Onset  . Cancer Mother   . Heart failure Mother   . Heart disease Mother   . Breast cancer Mother 58  . AAA (abdominal aortic aneurysm) Father   . Hypertension Father   . Heart disease Father   . Hypertension Sister    Social History   Socioeconomic History  . Marital status: Married    Spouse name: Not on file  . Number of children: Not on file  . Years of education: Not on file  . Highest education level: Not on file  Occupational History  . Not on file  Tobacco Use  . Smoking status: Former Smoker    Types: Cigarettes    Quit date: 01/19/1988    Years since quitting: 31.7  . Smokeless tobacco: Never Used  Substance and Sexual Activity  . Alcohol use: Yes    Alcohol/week: 3.0 standard drinks    Types: 3 Shots of liquor per week    Comment: 3 drinks a week socially   . Drug use: No  . Sexual activity: Not on file  Other Topics Concern  . Not  on file  Social History Narrative  . Not on file   Social Determinants of Health   Financial Resource Strain:   . Difficulty of Paying Living Expenses:   Food Insecurity:   . Worried About Charity fundraiser in the Last Year:   . Arboriculturist in the Last Year:   Transportation Needs:   . Film/video editor (Medical):   Marland Kitchen Lack of Transportation (Non-Medical):   Physical Activity:   . Days of Exercise per Week:   . Minutes of Exercise per Session:   Stress:   . Feeling of Stress :   Social Connections:   . Frequency of Communication with Friends and Family:   . Frequency of Social Gatherings with Friends and Family:   . Attends Religious Services:   . Active Member of Clubs or Organizations:   . Attends Archivist Meetings:   Marland Kitchen Marital  Status:     Outpatient Encounter Medications as of 10/09/2019  Medication Sig  . amLODipine (NORVASC) 5 MG tablet Take 1 tablet (5 mg total) by mouth daily.  Marland Kitchen atenolol (TENORMIN) 50 MG tablet TAKE ONE TABLET BY MOUTH DAILY - NEED APPOINTMENT FOR FURTHER REFILLS  . atorvastatin (LIPITOR) 40 MG tablet TAKE ONE TABLET BY MOUTH DAILY AT 6PM  . FLUoxetine (PROZAC) 40 MG capsule TAKE ONE CAPSULE BY MOUTH DAILY  . losartan-hydrochlorothiazide (HYZAAR) 100-12.5 MG tablet TAKE ONE TABLET BY MOUTH DAILY   No facility-administered encounter medications on file as of 10/09/2019.    Activities of Daily Living In your present state of health, do you have any difficulty performing the following activities: 10/09/2019  Hearing? N  Vision? N  Difficulty concentrating or making decisions? N  Walking or climbing stairs? N  Dressing or bathing? N  Doing errands, shopping? N  Preparing Food and eating ? N  Using the Toilet? N  In the past six months, have you accidently leaked urine? N  Do you have problems with loss of bowel control? N  Managing your Medications? N  Managing your Finances? N  Housekeeping or managing your Housekeeping? N  Some recent data might be hidden    Patient Care Team: Einar Pheasant, MD as PCP - General (Internal Medicine)    Assessment:   This is a routine wellness examination for Sherry Davila.  Nurse connected with patient 10/09/19 at 11:00 AM EDT by a telephone enabled telemedicine application, patient does not have access to video or difficulty with video and verified that I am speaking with the correct person using two identifiers. Patient stated full name and DOB. Patient gave permission to continue with virtual visit. Patient's location was at home and Nurse's location was at [place] office.   Patient is alert and oriented x3. Patient denies difficulty focusing or concentrating.  Health Maintenance Due: -Colonoscopy- ordered See completed HM at the end of note.    Eye: Visual acuity not assessed. Virtual visit. Followed by their ophthalmologist.  Dental: UTD  Hearing: Demonstrates normal hearing during visit.  Safety:  Patient feels safe at home- yes Patient does have smoke detectors at home- yes Patient does wear sunscreen or protective clothing when in direct sunlight - yes Patient does wear seat belt when in a moving vehicle - yes Patient drives- yes Adequate lighting in walkways free from debris- yes Grab bars and handrails used as appropriate- yes Ambulates with an assistive device- no Cell phone on person when ambulating outside of the home- yes  Social: Alcohol intake -  yes Smoking history- former Smokers in home? none Illicit drug use? none  Medication: Taking as directed and without issues.  Self managed - yes   Covid-19: Precautions and sickness symptoms discussed. Wears mask, social distancing, hand hygiene as appropriate.   Activities of Daily Living Patient denies needing assistance with: household chores, feeding themselves, getting from bed to chair, getting to the toilet, bathing/showering, dressing, managing money, or preparing meals.   Discussed the importance of a healthy diet, water intake and the benefits of aerobic exercise.   Physical activity- walking the dog daily. Paces self with activity.   Diet:  Healthy Water: good intake Caffeine: 2 cups of coffee  Other Providers Patient Care Team: Einar Pheasant, MD as PCP - General (Internal Medicine)  Exercise Activities and Dietary recommendations Current Exercise Habits: Home exercise routine, Type of exercise: walking, Intensity: Mild  Goals      Patient Stated   . I'd like to start going to the gym again (pt-stated)     Exercise, stay active       Fall Risk Fall Risk  10/09/2019 04/09/2018 09/28/2017 08/17/2017 09/26/2016  Falls in the past year? 0 0 No No No  Follow up Falls evaluation completed Falls evaluation completed - - -   Timed Get  Up and Go performed: no, virtual visit  Depression Screen PHQ 2/9 Scores 10/09/2019 09/28/2017 08/17/2017 01/19/2015  PHQ - 2 Score 0 0 2 4  PHQ- 9 Score - - 5 -     Cognitive Function     6CIT Screen 10/09/2019 08/17/2017  What Year? 0 points 0 points  What month? 0 points 0 points  What time? - 0 points  Count back from 20 - 0 points  Months in reverse 0 points 0 points  Repeat phrase - 0 points  Total Score - 0    Immunization History  Administered Date(s) Administered  . Influenza-Unspecified 03/04/2014  . PFIZER SARS-COV-2 Vaccination 07/22/2019, 08/12/2019  . Pneumococcal Conjugate-13 01/15/2014  . Pneumococcal-Unspecified 02/28/2013  . Td 01/15/2014  . Tdap 12/08/2016  . Zoster 01/02/2012   Screening Tests Health Maintenance  Topic Date Due  . COLONOSCOPY  09/09/2019  . INFLUENZA VACCINE  12/22/2019  . MAMMOGRAM  09/29/2021  . TETANUS/TDAP  12/09/2026  . DEXA SCAN  Completed  . COVID-19 Vaccine  Completed  . Hepatitis C Screening  Completed  . PNA vac Low Risk Adult  Completed       Plan:   Keep all routine maintenance appointments.   Follow up 10/11/19 @ 2:00  Colonoscopy ordered [facility], consent given. Due 09/09/19  Medicare Attestation I have personally reviewed: The patient's medical and social history Their use of alcohol, tobacco or illicit drugs Their current medications and supplements The patient's functional ability including ADLs,fall risks, home safety risks, cognitive, and hearing and visual impairment Diet and physical activities Evidence for depression   I have reviewed and discussed with patient certain preventive protocols, quality metrics, and best practice recommendations.     Varney Biles, LPN  [id number]   Reviewed above information.  Agree with assessment and plan.    Dr Nicki Reaper

## 2019-10-09 NOTE — Patient Instructions (Addendum)
  Ms. Sherry Davila , Thank you for taking time to come for your Medicare Wellness Visit. I appreciate your ongoing commitment to your health goals. Please review the following plan we discussed and let me know if I can assist you in the future.   These are the goals we discussed: Goals      Patient Stated   . I'd like to start going to the gym again (pt-stated)     Exercise, stay active       This is a list of the screening recommended for you and due dates:  Health Maintenance  Topic Date Due  . Colon Cancer Screening  09/09/2019  . Flu Shot  12/22/2019  . Mammogram  09/29/2021  . Tetanus Vaccine  12/09/2026  . DEXA scan (bone density measurement)  Completed  . COVID-19 Vaccine  Completed  .  Hepatitis C: One time screening is recommended by Center for Disease Control  (CDC) for  adults born from [birth year] through 1964.   Completed  . Pneumonia vaccines  Completed

## 2019-10-10 ENCOUNTER — Encounter: Payer: Self-pay | Admitting: *Deleted

## 2019-10-10 ENCOUNTER — Telehealth: Payer: Medicare Other

## 2019-10-11 ENCOUNTER — Other Ambulatory Visit: Payer: Self-pay

## 2019-10-11 ENCOUNTER — Telehealth: Payer: Self-pay | Admitting: Internal Medicine

## 2019-10-11 ENCOUNTER — Ambulatory Visit (INDEPENDENT_AMBULATORY_CARE_PROVIDER_SITE_OTHER): Payer: Medicare Other | Admitting: Internal Medicine

## 2019-10-11 DIAGNOSIS — R079 Chest pain, unspecified: Secondary | ICD-10-CM | POA: Insufficient documentation

## 2019-10-11 DIAGNOSIS — F320 Major depressive disorder, single episode, mild: Secondary | ICD-10-CM

## 2019-10-11 DIAGNOSIS — F32A Depression, unspecified: Secondary | ICD-10-CM

## 2019-10-11 DIAGNOSIS — F3342 Major depressive disorder, recurrent, in full remission: Secondary | ICD-10-CM

## 2019-10-11 DIAGNOSIS — K219 Gastro-esophageal reflux disease without esophagitis: Secondary | ICD-10-CM

## 2019-10-11 DIAGNOSIS — I10 Essential (primary) hypertension: Secondary | ICD-10-CM

## 2019-10-11 DIAGNOSIS — G4730 Sleep apnea, unspecified: Secondary | ICD-10-CM

## 2019-10-11 DIAGNOSIS — E785 Hyperlipidemia, unspecified: Secondary | ICD-10-CM

## 2019-10-11 DIAGNOSIS — K5900 Constipation, unspecified: Secondary | ICD-10-CM | POA: Diagnosis not present

## 2019-10-11 MED ORDER — FLUOXETINE HCL 40 MG PO CAPS: 40.0000 mg | ORAL_CAPSULE | Freq: Every day | ORAL | 1 refills | Status: DC

## 2019-10-11 MED ORDER — ATENOLOL 50 MG PO TABS: ORAL_TABLET | ORAL | 1 refills | Status: DC

## 2019-10-11 MED ORDER — AMLODIPINE BESYLATE 5 MG PO TABS: 5.0000 mg | ORAL_TABLET | Freq: Every day | ORAL | 1 refills | Status: DC

## 2019-10-11 MED ORDER — ATORVASTATIN CALCIUM 40 MG PO TABS: ORAL_TABLET | ORAL | 1 refills | Status: DC

## 2019-10-11 MED ORDER — LOSARTAN POTASSIUM-HCTZ 100-12.5 MG PO TABS: 1.0000 | ORAL_TABLET | Freq: Every day | ORAL | 1 refills | Status: DC

## 2019-10-11 NOTE — Telephone Encounter (Signed)
Pt needs a refill on all five of her medications and wants 90 day supply for them

## 2019-10-11 NOTE — Progress Notes (Signed)
Patient ID: Sherry Davila, female   DOB: 19-Dec-1945, 74 y.o.   MRN: [id number]   Subjective:    Patient ID: Sherry Davila, female    DOB: 01-Apr-1946, 74 y.o.   MRN: [id number]  HPI This visit occurred during the SARS-CoV-2 public health emergency.  Safety protocols were in place, including screening questions prior to the visit, additional usage of staff PPE, and extensive cleaning of exam room while observing appropriate contact time as indicated for disinfecting solutions.  Patient here for scheduled follow up.  Saw her as a new patient virtually 10/26/18.  Has a history of hypertension and hypercholesterolemia.  On prozac for depression.  Overall feels handling stress relatively well.  Has known sleep apnea. Uses cpap.  Has reported noticing heart beating hard.  Notices when sitting watching TV.  Some chest pain/tightness - previously noticed.  Discomfort went up to jaw.  No pain now.  Tries to stay active.  No abdominal pain reported.  Taking miralax to help bowels move.  Had colonoscopy three years ago.  Some constipation.  Will notice some streaks of blood when wipes - intermittent if has to strain.  Has noticed some acid reflux.  No vomiting.    Past Medical History:  Diagnosis Date  . Anxiety   . Depression   . Hyperlipidemia   . Hypertension   . Obesity   . Sleep apnea    has a cpap, doesn't wear   . Varicose vein    Past Surgical History:  Procedure Laterality Date  . COLONOSCOPY WITH PROPOFOL N/A 09/08/2016   Procedure: COLONOSCOPY WITH PROPOFOL;  Surgeon: Jonathon Bellows, MD;  Location: [facility] ENDOSCOPY;  Service: Endoscopy;  Laterality: N/A;  . FRACTURE SURGERY     foot, screws placed  . JOINT REPLACEMENT Right 2009   knee   Family History  Problem Relation Age of Onset  . Cancer Mother   . Heart failure Mother   . Heart disease Mother   . Breast cancer Mother 53  . AAA (abdominal aortic aneurysm) Father   . Hypertension Father   . Heart disease Father   .  Hypertension Sister    Social History   Socioeconomic History  . Marital status: Married    Spouse name: Not on file  . Number of children: Not on file  . Years of education: Not on file  . Highest education level: Not on file  Occupational History  . Not on file  Tobacco Use  . Smoking status: Former Smoker    Types: Cigarettes    Quit date: 01/19/1988    Years since quitting: 31.7  . Smokeless tobacco: Never Used  Substance and Sexual Activity  . Alcohol use: Yes    Alcohol/week: 3.0 standard drinks    Types: 3 Shots of liquor per week    Comment: 3 drinks a week socially   . Drug use: No  . Sexual activity: Not on file  Other Topics Concern  . Not on file  Social History Narrative  . Not on file   Social Determinants of Health   Financial Resource Strain:   . Difficulty of Paying Living Expenses:   Food Insecurity:   . Worried About Charity fundraiser in the Last Year:   . Arboriculturist in the Last Year:   Transportation Needs:   . Film/video editor (Medical):   Marland Kitchen Lack of Transportation (Non-Medical):   Physical Activity:   . Days of Exercise per Week:   .  Minutes of Exercise per Session:   Stress:   . Feeling of Stress :   Social Connections:   . Frequency of Communication with Friends and Family:   . Frequency of Social Gatherings with Friends and Family:   . Attends Religious Services:   . Active Member of Clubs or Organizations:   . Attends Archivist Meetings:   Marland Kitchen Marital Status:     Outpatient Encounter Medications as of 10/11/2019  Medication Sig  . [DISCONTINUED] amLODipine (NORVASC) 5 MG tablet Take 1 tablet (5 mg total) by mouth daily.  . [DISCONTINUED] atenolol (TENORMIN) 50 MG tablet TAKE ONE TABLET BY MOUTH DAILY - NEED APPOINTMENT FOR FURTHER REFILLS  . [DISCONTINUED] atorvastatin (LIPITOR) 40 MG tablet TAKE ONE TABLET BY MOUTH DAILY AT 6PM  . [DISCONTINUED] FLUoxetine (PROZAC) 40 MG capsule TAKE ONE CAPSULE BY MOUTH DAILY    . [DISCONTINUED] losartan-hydrochlorothiazide (HYZAAR) 100-12.5 MG tablet TAKE ONE TABLET BY MOUTH DAILY   No facility-administered encounter medications on file as of 10/11/2019.    Review of Systems  Constitutional: Negative for appetite change and unexpected weight change.  HENT: Negative for congestion and sinus pressure.   Respiratory: Positive for chest tightness. Negative for cough and shortness of breath.   Cardiovascular: Positive for chest pain and palpitations. Negative for leg swelling.  Gastrointestinal: Positive for constipation. Negative for abdominal pain, diarrhea, nausea and vomiting.       Some acid reflux as outlined.    Genitourinary: Negative for difficulty urinating and dysuria.  Musculoskeletal: Negative for joint swelling and myalgias.  Skin: Negative for color change and rash.  Neurological: Negative for dizziness, light-headedness and headaches.  Psychiatric/Behavioral: Negative for agitation and dysphoric mood.       Objective:    Physical Exam Vitals reviewed.  Constitutional:      General: She is not in acute distress.    Appearance: Normal appearance.  HENT:     Head: Normocephalic and atraumatic.     Right Ear: External ear normal.     Left Ear: External ear normal.  Eyes:     General: No scleral icterus.       Right eye: No discharge.        Left eye: No discharge.     Conjunctiva/sclera: Conjunctivae normal.  Neck:     Thyroid: No thyromegaly.  Cardiovascular:     Rate and Rhythm: Normal rate and regular rhythm.  Pulmonary:     Effort: No respiratory distress.     Breath sounds: Normal breath sounds. No wheezing.  Abdominal:     General: Bowel sounds are normal.     Palpations: Abdomen is soft.     Tenderness: There is no abdominal tenderness.  Musculoskeletal:        General: No swelling or tenderness.     Cervical back: Neck supple. No tenderness.  Lymphadenopathy:     Cervical: No cervical adenopathy.  Skin:    Findings: No  erythema or rash.  Neurological:     Mental Status: She is alert.  Psychiatric:        Mood and Affect: Mood normal.        Behavior: Behavior normal.     BP 136/78   Pulse 70   Temp 97.7 F (36.5 C)   Resp 16   Ht 5\' 2"  (1.575 m)   Wt 219 lb 3.2 oz (99.4 kg)   SpO2 99%   BMI 40.09 kg/m  Wt Readings from Last 3 Encounters:  10/11/19  219 lb 3.2 oz (99.4 kg)  10/09/19 216 lb (98 kg)  04/09/18 216 lb 3.2 oz (98.1 kg)     Lab Results  Component Value Date   WBC 5.3 12/26/2018   HGB 14.3 12/26/2018   HCT 43.4 12/26/2018   PLT 247.0 12/26/2018   GLUCOSE 102 (H) 12/26/2018   CHOL 238 (H) 12/26/2018   TRIG 79.0 12/26/2018   HDL 101.50 12/26/2018   LDLCALC 121 (H) 12/26/2018   ALT 15 12/26/2018   AST 14 12/26/2018   NA 138 12/26/2018   K 4.0 12/26/2018   CL 102 12/26/2018   CREATININE 0.57 12/26/2018   BUN 20 12/26/2018   CO2 28 12/26/2018   TSH 1.88 12/26/2018    MM 3D SCREEN BREAST BILATERAL  Result Date: 10/01/2019 CLINICAL DATA:  Screening. EXAM: DIGITAL SCREENING BILATERAL MAMMOGRAM WITH TOMO AND CAD COMPARISON:  Previous exam(s). ACR Breast Density Category b: There are scattered areas of fibroglandular density. FINDINGS: There are no findings suspicious for malignancy. Images were processed with CAD. IMPRESSION: No mammographic evidence of malignancy. A result letter of this screening mammogram will be mailed directly to the patient. RECOMMENDATION: Screening mammogram in one year. (Code:SM-B-01Y) BI-RADS CATEGORY  1: Negative. Electronically Signed   By: Ammie Ferrier M.D.   On: 10/01/2019 10:40       Assessment & Plan:   Problem List Items Addressed This Visit    Acid reflux    Acid reflux as outlined.  Was going to start pepcid.  States is only occasional.  Follow for triggers.  Avoid foods that aggravate.  Avoid eating late.  Follow.        Chest pain    Describes previous chest pain as outlined. Palpitations as outlined.  EKG - SR with flattening T  waves in III - non specific changes.  Discussed given risk factors and symptoms, referral to cardiology for question of need for further cardiac w/up.  Pt in agreement.        Relevant Orders   EKG 12-Lead   Ambulatory referral to Cardiology   Constipation    Gmiralax helped some.  Stay hydrated.  Continue miralax - take regularly.  Can add stool softener if needed.  F/u with GI when due.  Notify me if persistent.        Hyperlipidemia    On lipitor.  Low cholesterol diet and exercise.  Follow lipid panel and liver function tests.        Relevant Orders   Hepatic function panel   Lipid panel   Hypertension    Blood pressure as outlined.  Continue atenolol, losartan/hctz and amlodipine.  Follow pressures.  Follow metabolic panel.        Relevant Orders   Basic metabolic panel   Mild depression ([place])    Appears to be stable.  On prozac.  Continue.        Sleep apnea    Continue cpap.           Einar Pheasant, MD

## 2019-10-13 ENCOUNTER — Encounter: Payer: Self-pay | Admitting: Internal Medicine

## 2019-10-13 DIAGNOSIS — K219 Gastro-esophageal reflux disease without esophagitis: Secondary | ICD-10-CM | POA: Insufficient documentation

## 2019-10-13 NOTE — Assessment & Plan Note (Signed)
Blood pressure as outlined.  Continue atenolol, losartan/hctz and amlodipine.  Follow pressures.  Follow metabolic panel.  

## 2019-10-13 NOTE — Assessment & Plan Note (Signed)
On lipitor.  Low cholesterol diet and exercise.  Follow lipid panel and liver function tests.   

## 2019-10-13 NOTE — Assessment & Plan Note (Signed)
Appears to be stable.  On prozac.  Continue.

## 2019-10-13 NOTE — Assessment & Plan Note (Signed)
Continue cpap.  

## 2019-10-13 NOTE — Assessment & Plan Note (Signed)
Gmiralax helped some.  Stay hydrated.  Continue miralax - take regularly.  Can add stool softener if needed.  F/u with GI when due.  Notify me if persistent.

## 2019-10-13 NOTE — Assessment & Plan Note (Signed)
Describes previous chest pain as outlined. Palpitations as outlined.  EKG - SR with flattening T waves in III - non specific changes.  Discussed given risk factors and symptoms, referral to cardiology for question of need for further cardiac w/up.  Pt in agreement.

## 2019-10-13 NOTE — Assessment & Plan Note (Signed)
Acid reflux as outlined.  Was going to start pepcid.  States is only occasional.  Follow for triggers.  Avoid foods that aggravate.  Avoid eating late.  Follow.

## 2019-10-14 ENCOUNTER — Telehealth (INDEPENDENT_AMBULATORY_CARE_PROVIDER_SITE_OTHER): Payer: Self-pay | Admitting: Gastroenterology

## 2019-10-14 DIAGNOSIS — Z86010 Personal history of colonic polyps: Secondary | ICD-10-CM

## 2019-10-14 NOTE — Progress Notes (Signed)
Gastroenterology Pre-Procedure Review  Request Date: Tuesday 10/29/19 Requesting Physician: Dr. Vicente Males  PATIENT REVIEW QUESTIONS: The patient responded to the following health history questions as indicated:    1. Are you having any GI issues? no, however patient states she is taking Miralax to keep things moving. 2. Do you have a personal history of Polyps? yes (09/02/16 with Dr. Jonathon Bellows) 3. Do you have a family history of Colon Cancer or Polyps? no 4. Diabetes Mellitus? no 5. Joint replacements in the past 12 months?no 6. Major health problems in the past 3 months?no 7. Any artificial heart valves, MVP, or defibrillator?no    MEDICATIONS & ALLERGIES:    Patient reports the following regarding taking any anticoagulation/antiplatelet therapy:   Plavix, Coumadin, Eliquis, Xarelto, Lovenox, Pradaxa, Brilinta, or Effient? no Aspirin? no  Patient confirms/reports the following medications:  Current Outpatient Medications  Medication Sig Dispense Refill  . amLODipine (NORVASC) 5 MG tablet Take 1 tablet (5 mg total) by mouth daily. 90 tablet 1  . atenolol (TENORMIN) 50 MG tablet TAKE ONE TABLET BY MOUTH DAILY - NEED APPOINTMENT FOR FURTHER REFILLS 90 tablet 1  . atorvastatin (LIPITOR) 40 MG tablet TAKE ONE TABLET BY MOUTH DAILY AT 6PM 90 tablet 1  . FLUoxetine (PROZAC) 40 MG capsule Take 1 capsule (40 mg total) by mouth daily. 90 capsule 1  . losartan-hydrochlorothiazide (HYZAAR) 100-12.5 MG tablet Take 1 tablet by mouth daily. 90 tablet 1   No current facility-administered medications for this visit.    Patient confirms/reports the following allergies:  No Known Allergies  No orders of the defined types were placed in this encounter.   AUTHORIZATION INFORMATION Primary Insurance: 1D#: Group #:  Secondary Insurance: 1D#: Group #:  SCHEDULE INFORMATION: Date: Tuesday 10/29/19 Time: Location:[facility]

## 2019-10-22 ENCOUNTER — Telehealth: Payer: Self-pay

## 2019-10-22 DIAGNOSIS — Z86010 Personal history of colonic polyps: Secondary | ICD-10-CM

## 2019-10-22 NOTE — Telephone Encounter (Signed)
Returned patients call to reschedule 10/29/19 Colonoscopy with Dr. Vicente Males at [facility].  LVM for her to call me back to reschedule procedure.  Thanks,  [place], [place]

## 2019-10-22 NOTE — Telephone Encounter (Signed)
Patient has returned call to reschedule her colonoscopy.  Colonoscopy has been rescheduled to 01/07/20 with Dr. Vicente Males.  Pt has been advised of new COVID test date Friday 01/03/20.  New instructions have been sent via mychart.  A copy has been mailed also.  Referral updated.  Thanks,  [place], [place]

## 2019-10-24 ENCOUNTER — Encounter: Payer: Self-pay | Admitting: Emergency Medicine

## 2019-10-24 ENCOUNTER — Encounter: Payer: Self-pay | Admitting: Cardiology

## 2019-10-24 ENCOUNTER — Other Ambulatory Visit: Payer: Self-pay

## 2019-10-24 ENCOUNTER — Other Ambulatory Visit (INDEPENDENT_AMBULATORY_CARE_PROVIDER_SITE_OTHER): Payer: Medicare Other

## 2019-10-24 ENCOUNTER — Ambulatory Visit (INDEPENDENT_AMBULATORY_CARE_PROVIDER_SITE_OTHER): Payer: Medicare Other | Admitting: Cardiology

## 2019-10-24 ENCOUNTER — Ambulatory Visit
Admission: EM | Admit: 2019-10-24 | Discharge: 2019-10-24 | Disposition: A | Discharge status: Home / Self Care | Payer: Medicare Other | Attending: Emergency Medicine | Admitting: Emergency Medicine

## 2019-10-24 VITALS — BP 124/68 | HR 64 | Ht 66.0 in | Wt 223.4 lb

## 2019-10-24 DIAGNOSIS — J029 Acute pharyngitis, unspecified: Secondary | ICD-10-CM | POA: Diagnosis not present

## 2019-10-24 DIAGNOSIS — E7800 Pure hypercholesterolemia, unspecified: Secondary | ICD-10-CM

## 2019-10-24 DIAGNOSIS — R079 Chest pain, unspecified: Secondary | ICD-10-CM

## 2019-10-24 DIAGNOSIS — R0600 Dyspnea, unspecified: Secondary | ICD-10-CM

## 2019-10-24 DIAGNOSIS — R0609 Other forms of dyspnea: Secondary | ICD-10-CM

## 2019-10-24 DIAGNOSIS — R072 Precordial pain: Secondary | ICD-10-CM

## 2019-10-24 DIAGNOSIS — I10 Essential (primary) hypertension: Secondary | ICD-10-CM

## 2019-10-24 DIAGNOSIS — J309 Allergic rhinitis, unspecified: Secondary | ICD-10-CM | POA: Diagnosis not present

## 2019-10-24 DIAGNOSIS — E785 Hyperlipidemia, unspecified: Secondary | ICD-10-CM | POA: Diagnosis not present

## 2019-10-24 LAB — BASIC METABOLIC PANEL
BUN: 25 mg/dL — ABNORMAL HIGH (ref 6–23)
CO2: 27 mEq/L (ref 19–32)
Calcium: 8.8 mg/dL (ref 8.4–10.5)
Chloride: 105 mEq/L (ref 96–112)
Creatinine, Ser: 0.6 mg/dL (ref 0.40–1.20)
GFR: 98.13 mL/min (ref >60.00)
Glucose, Bld: 85 mg/dL (ref 70–99)
Potassium: 4.1 mEq/L (ref 3.5–5.1)
Sodium: 140 mEq/L (ref 135–145)

## 2019-10-24 LAB — HEPATIC FUNCTION PANEL
ALT: 20 U/L (ref 0–35)
AST: 17 U/L (ref 0–37)
Albumin: 4.1 g/dL (ref 3.5–5.2)
Alkaline Phosphatase: 112 U/L (ref 39–117)
Bilirubin, Direct: 0.1 mg/dL (ref 0.0–0.3)
Total Bilirubin: 0.5 mg/dL (ref 0.2–1.2)
Total Protein: 6.3 g/dL (ref 6.0–8.3)

## 2019-10-24 LAB — LIPID PANEL
Cholesterol: 203 mg/dL — ABNORMAL HIGH (ref 0–200)
HDL: 80.4 mg/dL (ref >39.00)
LDL Cholesterol: 102 mg/dL — ABNORMAL HIGH (ref 0–99)
NonHDL: 122.96
Total CHOL/HDL Ratio: 3
Triglycerides: 106 mg/dL (ref 0.0–149.0)
VLDL: 21.2 mg/dL (ref 0.0–40.0)

## 2019-10-24 LAB — POCT RAPID STREP A (OFFICE): Rapid Strep A Screen: NEGATIVE

## 2019-10-24 MED ORDER — LEVOCETIRIZINE DIHYDROCHLORIDE 5 MG PO TABS: 2.5000 mg | ORAL_TABLET | Freq: Every evening | ORAL | 0 refills | Status: DC

## 2019-10-24 NOTE — Patient Instructions (Signed)
Medication Instructions:   Your physician recommends that you continue on your current medications as directed. Please refer to the Current Medication list given to you today. *If you need a refill on your cardiac medications before your next appointment, please call your pharmacy*   Lab Work: None Ordered If you have labs (blood work) drawn today and your tests are completely normal, you will receive your results only by: Marland Kitchen MyChart Message (if you have MyChart) OR . A paper copy in the mail If you have any lab test that is abnormal or we need to change your treatment, we will call you to review the results.   Testing/Procedures:  Your physician has requested that you have an echocardiogram. Echocardiography is a painless test that uses sound waves to create images of your heart. It provides your doctor with information about the size and shape of your heart and how well your heart's chambers and valves are working. This procedure takes approximately one hour. There are no restrictions for this procedure.   [place]  Your caregiver has ordered a Stress Test with nuclear imaging. The purpose of this test is to evaluate the blood supply to your heart muscle. This procedure is referred to as a "Non-Invasive Stress Test." This is because other than having an IV started in your vein, nothing is inserted or "invades" your body. Cardiac stress tests are done to find areas of poor blood flow to the heart by determining the extent of coronary artery disease (CAD). Some patients exercise on a treadmill, which naturally increases the blood flow to your heart, while others who are  unable to walk on a treadmill due to physical limitations have a pharmacologic/chemical stress agent called Lexiscan . This medicine will mimic walking on a treadmill by temporarily increasing your coronary blood flow.   Please note: these test may take anywhere between 2-4 hours to complete  PLEASE REPORT TO [place] AT THE FIRST DESK WILL DIRECT YOU WHERE TO GO  Date of Procedure:_____________________________________  Arrival Time for Procedure:______________________________  Instructions regarding medication:    ____:  Hold betablocker(s) night before procedure and morning of procedure  ____:  Hold other medications as follows:_________________________________________________________________________________________________________________________________________________________________________________________________________________________________________________________________________________________  PLEASE NOTIFY THE OFFICE AT LEAST 24 HOURS IN ADVANCE IF YOU ARE UNABLE TO KEEP YOUR APPOINTMENT.  [phone] AND  PLEASE NOTIFY NUCLEAR MEDICINE AT [facility] AT LEAST 24 HOURS IN ADVANCE IF YOU ARE UNABLE TO KEEP YOUR APPOINTMENT. [phone]  How to prepare for your Myoview test:  1. Do not eat or drink after midnight 2. No caffeine for 24 hours prior to test 3. No smoking 24 hours prior to test. 4. Your medication may be taken with water.  If your doctor stopped a medication because of this test, do not take that medication. 5. Ladies, please do not wear dresses.  Skirts or pants are appropriate. Please wear a short sleeve shirt. 6. No perfume, cologne or lotion. 7. Wear comfortable walking shoes. No heels!       Follow-Up: At [facility], you and your health needs are our priority.  As part of our continuing mission to provide you with exceptional heart care, we have created designated Provider Care Teams.  These Care Teams include your primary Cardiologist (physician) and Advanced Practice Providers (APPs -  Physician Assistants and Nurse Practitioners) who all work together to provide you with the care you need, when you need it.  We recommend signing up for the patient  portal called "MyChart".  Sign up information is provided on this After Visit Summary.   MyChart is used to connect with patients for Virtual Visits (Telemedicine).  Patients are able to view lab/test results, encounter notes, upcoming appointments, etc.  Non-urgent messages can be sent to your provider as well.   To learn more about what you can do with MyChart, go to [email or web address].    Your next appointment:   Follow up after testing complete   The format for your next appointment:   In Person  Provider:   Kate Sable, MD   Other Instructions

## 2019-10-24 NOTE — ED Provider Notes (Signed)
Sherry Davila    CSN: [id number] Arrival date & time: 10/24/19  1606      History   Chief Complaint Chief Complaint  Patient presents with  . Sore Throat    HPI Sherry Davila is a 74 y.o. female.   Presents with sore throat x2 days.  She also reports postnasal drip.  She denies fever, chills, rash, cough, shortness of breath, vomiting, diarrhea, or other symptoms.  She attempted treatment at home with ibuprofen and OTC cold medication.    The history is provided by the patient.    Past Medical History:  Diagnosis Date  . Anxiety   . Depression   . Hyperlipidemia   . Hypertension   . Obesity   . Sleep apnea    has a cpap, doesn't wear   . Varicose vein     Patient Active Problem List   Diagnosis Date Noted  . Acid reflux 10/13/2019  . Chest pain 10/11/2019  . Healthcare maintenance 10/01/2019  . Constipation 10/28/2018  . Personal history of other malignant neoplasm of skin 08/06/2018  . Sleep apnea 09/28/2017  . Atrophic vaginitis 01/19/2015  . Hypertension   . Hyperlipidemia   . Anxiety   . Mild depression ([place])     Past Surgical History:  Procedure Laterality Date  . COLONOSCOPY WITH PROPOFOL N/A 09/08/2016   Procedure: COLONOSCOPY WITH PROPOFOL;  Surgeon: Jonathon Bellows, MD;  Location: [facility] ENDOSCOPY;  Service: Endoscopy;  Laterality: N/A;  . FRACTURE SURGERY     foot, screws placed  . JOINT REPLACEMENT Right 2009   knee    OB History   No obstetric history on file.      Home Medications    Prior to Admission medications   Medication Sig Start Date End Date Taking? Authorizing Provider  amLODipine (NORVASC) 5 MG tablet Take 1 tablet (5 mg total) by mouth daily. 10/11/19  Yes Einar Pheasant, MD  atenolol (TENORMIN) 50 MG tablet TAKE ONE TABLET BY MOUTH DAILY - NEED APPOINTMENT FOR FURTHER REFILLS 10/11/19  Yes Einar Pheasant, MD  atorvastatin (LIPITOR) 40 MG tablet TAKE ONE TABLET BY MOUTH DAILY AT [facility] 10/11/19  Yes Einar Pheasant, MD    FLUoxetine (PROZAC) 40 MG capsule Take 1 capsule (40 mg total) by mouth daily. 10/11/19  Yes Einar Pheasant, MD  losartan-hydrochlorothiazide (HYZAAR) 100-12.5 MG tablet Take 1 tablet by mouth daily. 10/11/19  Yes Einar Pheasant, MD  levocetirizine (XYZAL ALLERGY 24HR) 5 MG tablet Take 0.5 tablets (2.5 mg total) by mouth every evening. 10/24/19   Sharion Balloon, NP    Family History Family History  Problem Relation Age of Onset  . Cancer Mother   . Heart failure Mother   . Heart disease Mother   . Breast cancer Mother 44  . Heart attack Mother   . AAA (abdominal aortic aneurysm) Father   . Hypertension Father   . Heart disease Father   . Hypertension Sister     Social History Social History   Tobacco Use  . Smoking status: Former Smoker    Types: Cigarettes    Quit date: 01/19/1988    Years since quitting: 31.7  . Smokeless tobacco: Never Used  Substance Use Topics  . Alcohol use: Yes    Alcohol/week: 3.0 standard drinks    Types: 3 Shots of liquor per week    Comment: 3 drinks a week socially   . Drug use: No     Allergies   Patient has no known  allergies.   Review of Systems Review of Systems  Constitutional: Negative for chills and fever.  HENT: Positive for postnasal drip and sore throat. Negative for ear pain.   Eyes: Negative for pain and visual disturbance.  Respiratory: Negative for cough and shortness of breath.   Cardiovascular: Negative for chest pain and palpitations.  Gastrointestinal: Negative for abdominal pain, diarrhea, nausea and vomiting.  Genitourinary: Negative for dysuria and hematuria.  Musculoskeletal: Negative for arthralgias and back pain.  Skin: Negative for color change and rash.  Neurological: Negative for seizures and syncope.  All other systems reviewed and are negative.    Physical Exam Triage Vital Signs ED Triage Vitals  Enc Vitals Group     BP      Pulse      Resp      Temp      Temp src      SpO2      Weight       Height      Head Circumference      Peak Flow      Pain Score      Pain Loc      Pain Edu?      Excl. in [place]?    No data found.  Updated Vital Signs BP 134/66 (BP Location: Left Arm)   Pulse 64   Temp 99.1 F (37.3 C) (Oral)   Resp 18   Ht 5\' 6"  (1.676 m)   Wt 222 lb (100.7 kg)   SpO2 98%   BMI 35.83 kg/m   Visual Acuity Right Eye Distance:   Left Eye Distance:   Bilateral Distance:    Right Eye Near:   Left Eye Near:    Bilateral Near:     Physical Exam Vitals and nursing note reviewed.  Constitutional:      General: She is not in acute distress.    Appearance: She is well-developed. She is not ill-appearing.  HENT:     Head: Normocephalic and atraumatic.     Right Ear: Tympanic membrane normal.     Left Ear: Tympanic membrane normal.     Nose: Nose normal.     Mouth/Throat:     Mouth: Mucous membranes are moist.     Pharynx: Posterior oropharyngeal erythema present.  Eyes:     Conjunctiva/sclera: Conjunctivae normal.  Cardiovascular:     Rate and Rhythm: Normal rate and regular rhythm.     Heart sounds: No murmur.  Pulmonary:     Effort: Pulmonary effort is normal. No respiratory distress.     Breath sounds: Normal breath sounds.  Abdominal:     Palpations: Abdomen is soft.     Tenderness: There is no abdominal tenderness. There is no guarding or rebound.  Musculoskeletal:     Cervical back: Neck supple.  Skin:    General: Skin is warm and dry.     Findings: No rash.  Neurological:     General: No focal deficit present.     Mental Status: She is alert and oriented to person, place, and time.     Gait: Gait normal.  Psychiatric:        Mood and Affect: Mood normal.        Behavior: Behavior normal.      UC Treatments / Results  Labs (all labs ordered are listed, but only abnormal results are displayed) Labs Reviewed  CULTURE, GROUP A STREP [facility])  POCT RAPID STREP A (OFFICE)    EKG  Radiology No results  found.  Procedures Procedures (including critical care time)  Medications Ordered in UC Medications - No data to display  Initial Impression / Assessment and Plan / UC Course  I have reviewed the triage vital signs and the nursing notes.  Pertinent labs & imaging results that were available during my care of the patient were reviewed by me and considered in my medical decision making (see chart for details).   Sore throat, allergic rhinitis.  Rapid strep negative; throat culture pending.  Treating with Xyzal.  Instructed patient to take Tylenol or ibuprofen as needed for discomfort.  Instructed her to follow-up with her PCP if her symptoms or not improving.  Patient agrees to plan of care.     Final Clinical Impressions(s) / UC Diagnoses   Final diagnoses:  Sore throat  Allergic rhinitis, unspecified seasonality, unspecified trigger     Discharge Instructions     Your rapid strep test is negative.  A throat culture is pending; we will call you if it is positive requiring treatment.    Take the Xyzal as directed.  Take Tylenol or ibuprofen as needed for discomfort.    Follow up with your primary care provider if your symptoms are not improving.       ED Prescriptions    Medication Sig Dispense Auth. Provider   levocetirizine (XYZAL ALLERGY 24HR) 5 MG tablet Take 0.5 tablets (2.5 mg total) by mouth every evening. 14 tablet Sharion Balloon, NP     PDMP not reviewed this encounter.   Sharion Balloon, NP 10/24/19 [phone]

## 2019-10-24 NOTE — ED Triage Notes (Addendum)
Pt c/o sore throat. Started about 2 days ago but worse today. Denies fever. She states she has a lot of post nasal drainage. Pt has had covid vaccines.

## 2019-10-24 NOTE — Progress Notes (Addendum)
Cardiology Office Note:    Date:  10/24/2019   ID:  Sherry Davila, DOB 1946/02/25, MRN [id number]  PCP:  Einar Pheasant, MD  [facility] Cardiologist:  Kate Sable, MD  [facility] Electrophysiologist:  None   Referring MD: Einar Pheasant, MD   Chief Complaint  Patient presents with  . OTHER    Chest pain. Meds reviewed verbally with pt.    History of Present Illness:    Sherry Davila is a 74 y.o. female with a hx of anxiety, hypertension, hyperlipidemia, obesity, sleep apnea, former smoker x20 years who presents due to chest pain.  Patient states having chest discomfort on and off for over 10 years now.  Symptoms are not associated with exertion.  Last episode couple of months ago occurred when patient was sitting at home and noticed severe chest discomfort which she rates as 7 out of 10 with radiation to her neck.  Symptoms lasted about 20 minutes and then resolved.  She has also noticed infrequent prominent heartbeats.  She denies overt palpitations, edema, presyncope or syncope.  She also endorses dyspnea on exertion especially when going uphill.  She states her mother had a history of heart attack in her 64s.  Past Medical History:  Diagnosis Date  . Anxiety   . Depression   . Hyperlipidemia   . Hypertension   . Obesity   . Sleep apnea    has a cpap, doesn't wear   . Varicose vein     Past Surgical History:  Procedure Laterality Date  . COLONOSCOPY WITH PROPOFOL N/A 09/08/2016   Procedure: COLONOSCOPY WITH PROPOFOL;  Surgeon: Jonathon Bellows, MD;  Location: [facility] ENDOSCOPY;  Service: Endoscopy;  Laterality: N/A;  . FRACTURE SURGERY     foot, screws placed  . JOINT REPLACEMENT Right 2009   knee    Current Medications: No outpatient medications have been marked as taking for the 10/24/19 encounter (Office Visit) with Kate Sable, MD.     Allergies:   Patient has no known allergies.   Social History   Socioeconomic History  . Marital status:  Married    Spouse name: Not on file  . Number of children: Not on file  . Years of education: Not on file  . Highest education level: Not on file  Occupational History  . Not on file  Tobacco Use  . Smoking status: Former Smoker    Types: Cigarettes    Quit date: 01/19/1988    Years since quitting: 31.7  . Smokeless tobacco: Never Used  Substance and Sexual Activity  . Alcohol use: Yes    Alcohol/week: 3.0 standard drinks    Types: 3 Shots of liquor per week    Comment: 3 drinks a week socially   . Drug use: No  . Sexual activity: Not on file  Other Topics Concern  . Not on file  Social History Narrative  . Not on file   Social Determinants of Health   Financial Resource Strain:   . Difficulty of Paying Living Expenses:   Food Insecurity:   . Worried About Charity fundraiser in the Last Year:   . Arboriculturist in the Last Year:   Transportation Needs:   . Film/video editor (Medical):   Marland Kitchen Lack of Transportation (Non-Medical):   Physical Activity:   . Days of Exercise per Week:   . Minutes of Exercise per Session:   Stress:   . Feeling of Stress :   Social  Connections:   . Frequency of Communication with Friends and Family:   . Frequency of Social Gatherings with Friends and Family:   . Attends Religious Services:   . Active Member of Clubs or Organizations:   . Attends Archivist Meetings:   Marland Kitchen Marital Status:      Family History: The patient's family history includes AAA (abdominal aortic aneurysm) in her father; Breast cancer (age of onset: 71) in her mother; Cancer in her mother; Heart attack in her mother; Heart disease in her father and mother; Heart failure in her mother; Hypertension in her father and sister.  ROS:   Please see the history of present illness.     All other systems reviewed and are negative.  EKGs/Labs/Other Studies Reviewed:    The following studies were reviewed today:   EKG:  EKG is  ordered today.  The ekg ordered  today demonstrates normal sinus rhythm  Recent Labs: 12/26/2018: Hemoglobin 14.3; Platelets 247.0; TSH 1.88 10/24/2019: ALT 20; BUN 25; Creatinine, Ser 0.60; Potassium 4.1; Sodium 140  Recent Lipid Panel    Component Value Date/Time   CHOL 203 (H) 10/24/2019 0818   CHOL 177 04/09/2018 1124   TRIG 106.0 10/24/2019 0818   TRIG 83 04/09/2018 1124   HDL 80.40 10/24/2019 0818   HDL 109 09/28/2017 1557   CHOLHDL 3 10/24/2019 0818   VLDL 21.2 10/24/2019 0818   VLDL 17 04/09/2018 1124   LDLCALC 102 (H) 10/24/2019 0818   LDLCALC 110 (H) 09/28/2017 1557    Physical Exam:    VS:  BP 124/68 (BP Location: Right Arm, Patient Position: Sitting, Cuff Size: Normal)   Pulse 64   Ht 5\' 6"  (1.676 m)   Wt 223 lb 6 oz (101.3 kg)   SpO2 97%   BMI 36.05 kg/m     Wt Readings from Last 3 Encounters:  10/24/19 223 lb 6 oz (101.3 kg)  10/11/19 219 lb 3.2 oz (99.4 kg)  10/09/19 216 lb (98 kg)     GEN:  Well nourished, well developed in no acute distress HEENT: Normal NECK: No JVD; No carotid bruits LYMPHATICS: No lymphadenopathy CARDIAC: RRR, no murmurs, rubs, gallops RESPIRATORY:  Clear to auscultation without rales, wheezing or rhonchi  ABDOMEN: Soft, non-tender, non-distended MUSCULOSKELETAL:  No edema; No deformity  SKIN: Warm and dry NEUROLOGIC:  Alert and oriented x 3 PSYCHIATRIC:  Normal affect   ASSESSMENT:    1. Chest pain of uncertain etiology   2. Dyspnea on exertion   3. Essential hypertension   4. Pure hypercholesterolemia   5. Precordial pain    PLAN:    In order of problems listed above:  1. Patient with atypical chest discomfort.  She has risk factors of hypertension, former smoker, hyperlipidemia.  Will evaluate with Lexiscan Myoview for presence of ischemia. 2. Patient also with dyspnea on exertion, get echocardiogram to evaluate any structural cardiac pathology. 3. History of hypertension, blood pressure well controlled.  Continue current BP meds as  prescribed. 4. History of hyperlipidemia, continue statin.  Follow-up after echocardiogram and Myoview  This note was generated in part or whole with voice recognition software. Voice recognition is usually quite accurate but there are transcription errors that can and very often do occur. I apologize for any typographical errors that were not detected and corrected.  Medication Adjustments/Labs and Tests Ordered: Current medicines are reviewed at length with the patient today.  Concerns regarding medicines are outlined above.  Orders Placed This Encounter  Procedures  .  NM Myocar Multi W/Spect W/Wall Motion / EF  . EKG 12-Lead  . ECHOCARDIOGRAM COMPLETE   No orders of the defined types were placed in this encounter.   Patient Instructions  Medication Instructions:   Your physician recommends that you continue on your current medications as directed. Please refer to the Current Medication list given to you today. *If you need a refill on your cardiac medications before your next appointment, please call your pharmacy*   Lab Work: None Ordered If you have labs (blood work) drawn today and your tests are completely normal, you will receive your results only by: Marland Kitchen MyChart Message (if you have MyChart) OR . A paper copy in the mail If you have any lab test that is abnormal or we need to change your treatment, we will call you to review the results.   Testing/Procedures:  Your physician has requested that you have an echocardiogram. Echocardiography is a painless test that uses sound waves to create images of your heart. It provides your doctor with information about the size and shape of your heart and how well your heart's chambers and valves are working. This procedure takes approximately one hour. There are no restrictions for this procedure.   [place]  Your caregiver has ordered a Stress Test with nuclear imaging. The purpose of this test is to evaluate the blood supply  to your heart muscle. This procedure is referred to as a "Non-Invasive Stress Test." This is because other than having an IV started in your vein, nothing is inserted or "invades" your body. Cardiac stress tests are done to find areas of poor blood flow to the heart by determining the extent of coronary artery disease (CAD). Some patients exercise on a treadmill, which naturally increases the blood flow to your heart, while others who are  unable to walk on a treadmill due to physical limitations have a pharmacologic/chemical stress agent called Lexiscan . This medicine will mimic walking on a treadmill by temporarily increasing your coronary blood flow.   Please note: these test may take anywhere between 2-4 hours to complete  PLEASE REPORT TO [place] AT THE FIRST DESK WILL DIRECT YOU WHERE TO GO  Date of Procedure:_____________________________________  Arrival Time for Procedure:______________________________  Instructions regarding medication:    ____:  Hold betablocker(s) night before procedure and morning of procedure  ____:  Hold other medications as follows:_________________________________________________________________________________________________________________________________________________________________________________________________________________________________________________________________________________________  PLEASE NOTIFY THE OFFICE AT LEAST 24 HOURS IN ADVANCE IF YOU ARE UNABLE TO KEEP YOUR APPOINTMENT.  [phone] AND  PLEASE NOTIFY NUCLEAR MEDICINE AT [facility] AT LEAST 24 HOURS IN ADVANCE IF YOU ARE UNABLE TO KEEP YOUR APPOINTMENT. [phone]  How to prepare for your Myoview test:  1. Do not eat or drink after midnight 2. No caffeine for 24 hours prior to test 3. No smoking 24 hours prior to test. 4. Your medication may be taken with water.  If your doctor stopped a medication because of this test, do not take that  medication. 5. Ladies, please do not wear dresses.  Skirts or pants are appropriate. Please wear a short sleeve shirt. 6. No perfume, cologne or lotion. 7. Wear comfortable walking shoes. No heels!       Follow-Up: At [facility], you and your health needs are our priority.  As part of our continuing mission to provide you with exceptional heart care, we have created designated Provider Care Teams.  These Care Teams include your primary Cardiologist (physician) and  Advanced Practice Providers (APPs -  Physician Assistants and Nurse Practitioners) who all work together to provide you with the care you need, when you need it.  We recommend signing up for the patient portal called "MyChart".  Sign up information is provided on this After Visit Summary.  MyChart is used to connect with patients for Virtual Visits (Telemedicine).  Patients are able to view lab/test results, encounter notes, upcoming appointments, etc.  Non-urgent messages can be sent to your provider as well.   To learn more about what you can do with MyChart, go to [email or web address].    Your next appointment:   Follow up after testing complete   The format for your next appointment:   In Person  Provider:   Kate Sable, MD   Other Instructions      Signed, Kate Sable, MD  10/24/2019 12:19 PM    Combine

## 2019-10-24 NOTE — Discharge Instructions (Signed)
Your rapid strep test is negative.  A throat culture is pending; we will call you if it is positive requiring treatment.    Take the Xyzal as directed.  Take Tylenol or ibuprofen as needed for discomfort.    Follow up with your primary care provider if your symptoms are not improving.

## 2019-10-25 ENCOUNTER — Encounter: Payer: Self-pay | Admitting: Internal Medicine

## 2019-10-27 LAB — CULTURE, GROUP A STREP (THRC)

## 2019-12-30 ENCOUNTER — Other Ambulatory Visit: Payer: Self-pay

## 2019-12-30 ENCOUNTER — Encounter
Admission: RE | Admit: 2019-12-30 | Discharge: 2019-12-30 | Disposition: A | Discharge status: Home / Self Care | Payer: Medicare Other | Source: Ambulatory Visit | Attending: Cardiology | Admitting: Cardiology

## 2019-12-30 DIAGNOSIS — R0600 Dyspnea, unspecified: Secondary | ICD-10-CM | POA: Insufficient documentation

## 2019-12-30 DIAGNOSIS — R0609 Other forms of dyspnea: Secondary | ICD-10-CM

## 2019-12-30 DIAGNOSIS — R079 Chest pain, unspecified: Secondary | ICD-10-CM | POA: Diagnosis not present

## 2019-12-30 DIAGNOSIS — E7800 Pure hypercholesterolemia, unspecified: Secondary | ICD-10-CM | POA: Insufficient documentation

## 2019-12-30 DIAGNOSIS — I10 Essential (primary) hypertension: Secondary | ICD-10-CM | POA: Insufficient documentation

## 2019-12-30 DIAGNOSIS — R072 Precordial pain: Secondary | ICD-10-CM | POA: Insufficient documentation

## 2019-12-30 LAB — NM MYOCAR MULTI W/SPECT W/WALL MOTION / EF
LV dias vol: 124 mL (ref 46–106)
LV sys vol: 40 mL
Peak HR: 64 {beats}/min
Percent HR: 43 %
Rest HR: 52 {beats}/min
SDS: 0
SRS: 5
SSS: 0
TID: 0.95

## 2019-12-30 MED ORDER — TECHNETIUM TC 99M TETROFOSMIN IV KIT
10.0000 | PACK | Freq: Once | INTRAVENOUS | Status: AC | PRN
  Administered 2019-12-30: 10.489 via INTRAVENOUS

## 2019-12-30 MED ORDER — REGADENOSON 0.4 MG/5ML IV SOLN
0.4000 mg | Freq: Once | INTRAVENOUS | Status: AC
  Administered 2019-12-30: 0.4 mg via INTRAVENOUS
  Filled 2019-12-30: qty 5

## 2019-12-30 MED ORDER — TECHNETIUM TC 99M TETROFOSMIN IV KIT
30.0000 | PACK | Freq: Once | INTRAVENOUS | Status: AC | PRN
  Administered 2019-12-30: 30.902 via INTRAVENOUS

## 2020-01-02 ENCOUNTER — Telehealth: Payer: Self-pay

## 2020-01-02 NOTE — Telephone Encounter (Signed)
Attempted to call patient. LMTCB 01/02/2020   

## 2020-01-02 NOTE — Telephone Encounter (Signed)
-----   Message from Kate Sable, MD sent at 12/31/2019 10:44 AM EDT ----- Low risk stress test, no evidence for ischemia.

## 2020-01-03 ENCOUNTER — Other Ambulatory Visit
Admission: RE | Admit: 2020-01-03 | Discharge: 2020-01-03 | Disposition: A | Discharge status: Home / Self Care | Payer: Medicare Other | Source: Ambulatory Visit | Attending: Gastroenterology | Admitting: Gastroenterology

## 2020-01-03 ENCOUNTER — Other Ambulatory Visit: Payer: Self-pay

## 2020-01-03 DIAGNOSIS — Z20822 Contact with and (suspected) exposure to covid-19: Secondary | ICD-10-CM | POA: Insufficient documentation

## 2020-01-03 DIAGNOSIS — Z01812 Encounter for preprocedural laboratory examination: Secondary | ICD-10-CM | POA: Diagnosis not present

## 2020-01-04 LAB — SARS CORONAVIRUS 2 (TAT 6-24 HRS): SARS Coronavirus 2: NEGATIVE

## 2020-01-06 ENCOUNTER — Encounter: Payer: Self-pay | Admitting: Gastroenterology

## 2020-01-07 ENCOUNTER — Ambulatory Visit
Admission: RE | Admit: 2020-01-07 | Discharge: 2020-01-07 | Disposition: A | Discharge status: Home / Self Care | Payer: Medicare Other | Attending: Gastroenterology | Admitting: Gastroenterology

## 2020-01-07 ENCOUNTER — Encounter: Payer: Self-pay | Admitting: Gastroenterology

## 2020-01-07 ENCOUNTER — Other Ambulatory Visit: Payer: Self-pay

## 2020-01-07 ENCOUNTER — Encounter
Admission: RE | Disposition: A | Discharge status: Home / Self Care | Payer: Self-pay | Source: Home / Self Care | Attending: Gastroenterology

## 2020-01-07 ENCOUNTER — Ambulatory Visit: Payer: Medicare Other | Admitting: Certified Registered"

## 2020-01-07 DIAGNOSIS — Z87891 Personal history of nicotine dependence: Secondary | ICD-10-CM | POA: Diagnosis not present

## 2020-01-07 DIAGNOSIS — I10 Essential (primary) hypertension: Secondary | ICD-10-CM | POA: Diagnosis not present

## 2020-01-07 DIAGNOSIS — D122 Benign neoplasm of ascending colon: Secondary | ICD-10-CM | POA: Diagnosis not present

## 2020-01-07 DIAGNOSIS — Z79899 Other long term (current) drug therapy: Secondary | ICD-10-CM | POA: Diagnosis not present

## 2020-01-07 DIAGNOSIS — F419 Anxiety disorder, unspecified: Secondary | ICD-10-CM | POA: Diagnosis not present

## 2020-01-07 DIAGNOSIS — F329 Major depressive disorder, single episode, unspecified: Secondary | ICD-10-CM | POA: Diagnosis not present

## 2020-01-07 DIAGNOSIS — Z8249 Family history of ischemic heart disease and other diseases of the circulatory system: Secondary | ICD-10-CM | POA: Insufficient documentation

## 2020-01-07 DIAGNOSIS — Z6835 Body mass index (BMI) 35.0-35.9, adult: Secondary | ICD-10-CM | POA: Diagnosis not present

## 2020-01-07 DIAGNOSIS — K635 Polyp of colon: Secondary | ICD-10-CM

## 2020-01-07 DIAGNOSIS — Z1211 Encounter for screening for malignant neoplasm of colon: Secondary | ICD-10-CM | POA: Diagnosis not present

## 2020-01-07 DIAGNOSIS — Z86010 Personal history of colonic polyps: Secondary | ICD-10-CM | POA: Diagnosis not present

## 2020-01-07 DIAGNOSIS — G4730 Sleep apnea, unspecified: Secondary | ICD-10-CM | POA: Insufficient documentation

## 2020-01-07 DIAGNOSIS — E669 Obesity, unspecified: Secondary | ICD-10-CM | POA: Insufficient documentation

## 2020-01-07 DIAGNOSIS — E785 Hyperlipidemia, unspecified: Secondary | ICD-10-CM | POA: Insufficient documentation

## 2020-01-07 HISTORY — PX: COLONOSCOPY WITH PROPOFOL: SHX5780

## 2020-01-07 SURGERY — COLONOSCOPY WITH PROPOFOL
Anesthesia: General

## 2020-01-07 MED ORDER — PROPOFOL 10 MG/ML IV BOLUS
INTRAVENOUS | Status: AC
  Filled 2020-01-07: qty 20

## 2020-01-07 MED ORDER — PROPOFOL 500 MG/50ML IV EMUL
INTRAVENOUS | Status: AC
  Filled 2020-01-07: qty 50

## 2020-01-07 MED ORDER — PROPOFOL 10 MG/ML IV BOLUS
INTRAVENOUS | Status: DC | PRN
  Administered 2020-01-07: 50 mg via INTRAVENOUS

## 2020-01-07 MED ORDER — SODIUM CHLORIDE 0.9 % IV SOLN
INTRAVENOUS | Status: DC
  Administered 2020-01-07: 1000 mL via INTRAVENOUS

## 2020-01-07 MED ORDER — PROPOFOL 500 MG/50ML IV EMUL
INTRAVENOUS | Status: DC | PRN
  Administered 2020-01-07: 125 ug/kg/min via INTRAVENOUS

## 2020-01-07 MED ORDER — LIDOCAINE HCL (CARDIAC) PF 100 MG/5ML IV SOSY
PREFILLED_SYRINGE | INTRAVENOUS | Status: DC | PRN
  Administered 2020-01-07: 50 mg via INTRAVENOUS

## 2020-01-07 NOTE — Transfer of Care (Signed)
Immediate Anesthesia Transfer of Care Note  Patient: Sherry Davila  Procedure(s) Performed: COLONOSCOPY WITH PROPOFOL (N/A )  Patient Location: PACU and Endoscopy Unit  Anesthesia Type:General  Level of Consciousness: awake, alert  and oriented  Airway & Oxygen Therapy: Patient Spontanous Breathing  Post-op Assessment: Report given to RN and Post -op Vital signs reviewed and stable  Post vital signs: Reviewed and stable  Last Vitals:  Vitals Value Taken Time  BP 125/69 01/07/20 0841  Temp    Pulse 58 01/07/20 0841  Resp 16 01/07/20 0841  SpO2 100 % 01/07/20 0841    Last Pain:  Vitals:   01/07/20 0735  TempSrc: Temporal         Complications: No complications documented.

## 2020-01-07 NOTE — Anesthesia Procedure Notes (Signed)
Date/Time: 01/07/2020 8:13 AM Performed by: Jerrye Noble, CRNA Pre-anesthesia Checklist: Patient identified, Emergency Drugs available, Suction available and Patient being monitored Patient Re-evaluated:Patient Re-evaluated prior to induction Oxygen Delivery Method: Nasal cannula

## 2020-01-07 NOTE — H&P (Signed)
Sherry Bellows, MD [address], [place], [place], [place], [address] [address], [place], [place], [place], [address] Phone: [phone]  Fax: [phone]  Primary Care Physician:  Einar Pheasant, MD   Pre-Procedure History & Physical: HPI:  Sherry Davila is a 74 y.o. female is here for an colonoscopy.   Past Medical History:  Diagnosis Date  . Anxiety   . Depression   . Hyperlipidemia   . Hypertension   . Obesity   . Sleep apnea    has a cpap, doesn't wear   . Varicose vein     Past Surgical History:  Procedure Laterality Date  . COLONOSCOPY WITH PROPOFOL N/A 09/08/2016   Procedure: COLONOSCOPY WITH PROPOFOL;  Surgeon: Sherry Bellows, MD;  Location: [facility] ENDOSCOPY;  Service: Endoscopy;  Laterality: N/A;  . FRACTURE SURGERY     foot, screws placed  . JOINT REPLACEMENT Right 2009   knee    Prior to Admission medications   Medication Sig Start Date End Date Taking? Authorizing Provider  amLODipine (NORVASC) 5 MG tablet Take 1 tablet (5 mg total) by mouth daily. 10/11/19  Yes Einar Pheasant, MD  atenolol (TENORMIN) 50 MG tablet TAKE ONE TABLET BY MOUTH DAILY - NEED APPOINTMENT FOR FURTHER REFILLS 10/11/19  Yes Einar Pheasant, MD  atorvastatin (LIPITOR) 40 MG tablet TAKE ONE TABLET BY MOUTH DAILY AT [facility] 10/11/19  Yes Einar Pheasant, MD  FLUoxetine (PROZAC) 40 MG capsule Take 1 capsule (40 mg total) by mouth daily. 10/11/19  Yes Einar Pheasant, MD  losartan-hydrochlorothiazide (HYZAAR) 100-12.5 MG tablet Take 1 tablet by mouth daily. 10/11/19  Yes Einar Pheasant, MD  levocetirizine (XYZAL ALLERGY 24HR) 5 MG tablet Take 0.5 tablets (2.5 mg total) by mouth every evening. Patient not taking: Reported on 01/07/2020 10/24/19   Sharion Balloon, NP    Allergies as of 10/14/2019  . (No Known Allergies)    Family History  Problem Relation Age of Onset  . Cancer Mother   . Heart failure Mother   . Heart disease Mother   . Breast cancer Mother 68  . Heart attack Mother   .  AAA (abdominal aortic aneurysm) Father   . Hypertension Father   . Heart disease Father   . Hypertension Sister     Social History   Socioeconomic History  . Marital status: Married    Spouse name: Not on file  . Number of children: Not on file  . Years of education: Not on file  . Highest education level: Not on file  Occupational History  . Not on file  Tobacco Use  . Smoking status: Former Smoker    Types: Cigarettes    Quit date: 01/19/1988    Years since quitting: 31.9  . Smokeless tobacco: Never Used  Vaping Use  . Vaping Use: Never used  Substance and Sexual Activity  . Alcohol use: Yes    Alcohol/week: 3.0 standard drinks    Types: 3 Shots of liquor per week    Comment: 3 drinks a week socially   . Drug use: No  . Sexual activity: Not on file  Other Topics Concern  . Not on file  Social History Narrative  . Not on file   Social Determinants of Health   Financial Resource Strain:   . Difficulty of Paying Living Expenses:   Food Insecurity:   . Worried About Charity fundraiser in the Last Year:   . [place] in the Last Year:  Transportation Needs:   . Film/video editor (Medical):   Marland Kitchen Lack of Transportation (Non-Medical):   Physical Activity:   . Days of Exercise per Week:   . Minutes of Exercise per Session:   Stress:   . Feeling of Stress :   Social Connections:   . Frequency of Communication with Friends and Family:   . Frequency of Social Gatherings with Friends and Family:   . Attends Religious Services:   . Active Member of Clubs or Organizations:   . Attends Archivist Meetings:   Marland Kitchen Marital Status:   Intimate Partner Violence:   . Fear of Current or Ex-Partner:   . Emotionally Abused:   Marland Kitchen Physically Abused:   . Sexually Abused:     Review of Systems: See HPI, otherwise negative ROS  Physical Exam: BP (!) 169/69   Pulse (!) 57   Temp (!) 97 F (36.1 C) (Temporal)   Resp 18   Ht 5\' 6"  (1.676 m)   Wt 100 kg    SpO2 100%   BMI 35.58 kg/m  General:   Alert,  pleasant and cooperative in NAD Head:  Normocephalic and atraumatic. Neck:  Supple; no masses or thyromegaly. Lungs:  Clear throughout to auscultation, normal respiratory effort.    Heart:  +S1, +S2, Regular rate and rhythm, No edema. Abdomen:  Soft, nontender and nondistended. Normal bowel sounds, without guarding, and without rebound.   Neurologic:  Alert and  oriented x4;  grossly normal neurologically.  Impression/Plan: [name] is here for an colonoscopy to be performed for surveillance due to prior history of colon polyps   Risks, benefits, limitations, and alternatives regarding  colonoscopy have been reviewed with the patient.  Questions have been answered.  All parties agreeable.   Sherry Bellows, MD  01/07/2020, 8:12 AM

## 2020-01-07 NOTE — Anesthesia Postprocedure Evaluation (Signed)
Anesthesia Post Note  Patient: Sherry Davila  Procedure(s) Performed: COLONOSCOPY WITH PROPOFOL (N/A )  Patient location during evaluation: Endoscopy Anesthesia Type: General Level of consciousness: awake and alert Pain management: pain level controlled Vital Signs Assessment: post-procedure vital signs reviewed and stable Respiratory status: spontaneous breathing and respiratory function stable Cardiovascular status: stable Anesthetic complications: no   No complications documented.   Last Vitals:  Vitals:   01/07/20 0851 01/07/20 0901  BP: (!) 154/82 (!) 160/79  Pulse: (!) 51 (!) 53  Resp: 14 13  Temp:    SpO2: 100% 100%    Last Pain:  Vitals:   01/07/20 0901  TempSrc:   PainSc: 0-No pain                 Darra Rosa K

## 2020-01-07 NOTE — Op Note (Signed)
[facility] Patient Name: Sherry Davila Procedure Date: 01/07/2020 7:03 AM MRN: [id number] Account #: [id number] Date of Birth: [birth year] Admit Type: Outpatient Age: 74 Room: [facility] ENDO ROOM 1 Gender: Female Note Status: Finalized Procedure:             Colonoscopy Indications:           High risk colon cancer surveillance: Personal history                         of colonic polyps, Last colonoscopy: April 2018 Providers:             Jonathon Bellows MD, MD Medicines:             Monitored Anesthesia Care Complications:         No immediate complications. Procedure:             Pre-Anesthesia Assessment:                        - Prior to the procedure, a History and Physical was                         performed, and patient medications, allergies and                         sensitivities were reviewed. The patient's tolerance                         of previous anesthesia was reviewed.                        - The risks and benefits of the procedure and the                         sedation options and risks were discussed with the                         patient. All questions were answered and informed                         consent was obtained.                        - ASA Grade Assessment: II - A patient with mild                         systemic disease.                        After obtaining informed consent, the colonoscope was                         passed under direct vision. Throughout the procedure,                         the patient's blood pressure, pulse, and oxygen                         saturations were monitored continuously. The  Colonoscope was introduced through the anus and                         advanced to the the cecum, identified by the                         appendiceal orifice. The colonoscopy was performed                         with ease. The patient tolerated the procedure well.                          The quality of the bowel preparation was excellent. Findings:      The perianal and digital rectal examinations were normal.      A 4 mm polyp was found in the proximal ascending colon. The polyp was       sessile. The polyp was removed with a cold snare. Resection was       complete, but the polyp tissue was not retrieved.      The exam was otherwise without abnormality on direct and retroflexion       views. Impression:            - One 4 mm polyp in the proximal ascending colon,                         removed with a cold snare. Complete resection. Polyp                         tissue not retrieved.                        - The examination was otherwise normal on direct and                         retroflexion views. Recommendation:        - Discharge patient to home (with escort).                        - Resume previous diet.                        - Continue present medications.                        - Await pathology results.                        - Repeat colonoscopy in 5 years for surveillance. Procedure Code(s):     --- Professional ---                        [phone], Colonoscopy, flexible; with removal of                         tumor(s), polyp(s), or other lesion(s) by snare                         technique Diagnosis Code(s):     --- Professional ---  Z86.010, Personal history of colonic polyps                        K63.5, Polyp of colon CPT copyright 2019 American Medical Association. All rights reserved. The codes documented in this report are preliminary and upon coder review may  be revised to meet current compliance requirements. Jonathon Bellows, MD Jonathon Bellows MD, MD 01/07/2020 8:37:39 AM This report has been signed electronically. Number of Addenda: 0 Note Initiated On: 01/07/2020 7:03 AM Scope Withdrawal Time: 0 hours 11 minutes 48 seconds  Total Procedure Duration: 0 hours 17 minutes 5 seconds  Estimated Blood Loss:  Estimated blood  loss: none.      [facility]

## 2020-01-07 NOTE — Anesthesia Preprocedure Evaluation (Signed)
Anesthesia Evaluation  Patient identified by MRN, date of birth, ID band Patient awake    Reviewed: Allergy & Precautions, NPO status , Patient's Chart, lab work & pertinent test results  History of Anesthesia Complications Negative for: history of anesthetic complications  Airway Mallampati: II       Dental   Pulmonary sleep apnea and Continuous Positive Airway Pressure Ventilation , neg COPD, Not current smoker, former smoker,           Cardiovascular hypertension, Pt. on medications (-) Past MI and (-) CHF (-) dysrhythmias (-) Valvular Problems/Murmurs     Neuro/Psych neg Seizures Anxiety Depression    GI/Hepatic Neg liver ROS, GERD (rare)  ,  Endo/Other  neg diabetes  Renal/GU negative Renal ROS     Musculoskeletal   Abdominal   Peds  Hematology   Anesthesia Other Findings   Reproductive/Obstetrics                             Anesthesia Physical Anesthesia Plan  ASA: II  Anesthesia Plan: General   Post-op Pain Management:    Induction: Intravenous  PONV Risk Score and Plan: 3 and Propofol infusion, TIVA and Treatment may vary due to age or medical condition  Airway Management Planned: Nasal Cannula  Additional Equipment:   Intra-op Plan:   Post-operative Plan:   Informed Consent: I have reviewed the patients History and Physical, chart, labs and discussed the procedure including the risks, benefits and alternatives for the proposed anesthesia with the patient or authorized representative who has indicated his/her understanding and acceptance.       Plan Discussed with:   Anesthesia Plan Comments:         Anesthesia Quick Evaluation

## 2020-01-07 NOTE — Telephone Encounter (Signed)
Call to patient. No answer. Left detailed message with results and echo appt later this week.

## 2020-01-08 ENCOUNTER — Encounter: Payer: Self-pay | Admitting: Gastroenterology

## 2020-01-09 ENCOUNTER — Other Ambulatory Visit: Payer: Self-pay

## 2020-01-09 ENCOUNTER — Ambulatory Visit (INDEPENDENT_AMBULATORY_CARE_PROVIDER_SITE_OTHER): Payer: Medicare Other

## 2020-01-09 DIAGNOSIS — R072 Precordial pain: Secondary | ICD-10-CM

## 2020-01-09 DIAGNOSIS — E7800 Pure hypercholesterolemia, unspecified: Secondary | ICD-10-CM | POA: Diagnosis not present

## 2020-01-09 DIAGNOSIS — R079 Chest pain, unspecified: Secondary | ICD-10-CM

## 2020-01-09 DIAGNOSIS — R0600 Dyspnea, unspecified: Secondary | ICD-10-CM | POA: Diagnosis not present

## 2020-01-09 DIAGNOSIS — I10 Essential (primary) hypertension: Secondary | ICD-10-CM

## 2020-01-09 DIAGNOSIS — R0609 Other forms of dyspnea: Secondary | ICD-10-CM

## 2020-01-09 LAB — ECHOCARDIOGRAM COMPLETE
AR max vel: 4.04 cm2
AV Area VTI: 3.6 cm2
AV Area mean vel: 3.2 cm2
AV Mean grad: 5 mmHg
AV Peak grad: 8.6 mmHg
Ao pk vel: 1.47 m/s
Area-P 1/2: 3.06 cm2
Calc EF: 54.6 %
P 1/2 time: 558 msec
S' Lateral: 3.3 cm
Single Plane A2C EF: 57 %
Single Plane A4C EF: 53.7 %

## 2020-01-13 ENCOUNTER — Other Ambulatory Visit: Payer: Self-pay

## 2020-01-13 ENCOUNTER — Ambulatory Visit (INDEPENDENT_AMBULATORY_CARE_PROVIDER_SITE_OTHER): Payer: Medicare Other | Admitting: Cardiology

## 2020-01-13 ENCOUNTER — Encounter: Payer: Self-pay | Admitting: Cardiology

## 2020-01-13 VITALS — BP 130/70 | HR 60 | Ht 66.0 in | Wt 223.4 lb

## 2020-01-13 DIAGNOSIS — E7800 Pure hypercholesterolemia, unspecified: Secondary | ICD-10-CM | POA: Diagnosis not present

## 2020-01-13 DIAGNOSIS — Z6836 Body mass index (BMI) 36.0-36.9, adult: Secondary | ICD-10-CM | POA: Diagnosis not present

## 2020-01-13 DIAGNOSIS — I5189 Other ill-defined heart diseases: Secondary | ICD-10-CM | POA: Diagnosis not present

## 2020-01-13 DIAGNOSIS — I10 Essential (primary) hypertension: Secondary | ICD-10-CM | POA: Diagnosis not present

## 2020-01-13 NOTE — Patient Instructions (Signed)

## 2020-01-13 NOTE — Progress Notes (Signed)
Cardiology Office Note:    Date:  01/13/2020   ID:  Sherry Davila, DOB 20-Dec-1945, MRN [id number]  PCP:  Einar Pheasant, MD  [facility] Cardiologist:  Kate Sable, MD  [facility] Electrophysiologist:  None   Referring MD: Einar Pheasant, MD   Chief Complaint  Patient presents with  . office visit    F/U after cardiac testing; Meds verbally reviewed with patient.    History of Present Illness:    Sherry Davila is a 74 y.o. female with a hx of anxiety, hypertension, hyperlipidemia, obesity, sleep apnea, former smoker x20 years who presents for follow-up.  She was last seen due to chest pain.  Patient also endorsed shortness of breath with exertion.  Due to risk factors, echocardiogram and stress test with Lexiscan Myoview was ordered.  Patient states doing okay.  Still has occasional shortness of breath when she exerts herself.  She quit smoking over 30 years ago.   Past Medical History:  Diagnosis Date  . Anxiety   . Depression   . Hyperlipidemia   . Hypertension   . Obesity   . Sleep apnea    has a cpap, doesn't wear   . Varicose vein     Past Surgical History:  Procedure Laterality Date  . COLONOSCOPY WITH PROPOFOL N/A 09/08/2016   Procedure: COLONOSCOPY WITH PROPOFOL;  Surgeon: Jonathon Bellows, MD;  Location: [facility] ENDOSCOPY;  Service: Endoscopy;  Laterality: N/A;  . COLONOSCOPY WITH PROPOFOL N/A 01/07/2020   Procedure: COLONOSCOPY WITH PROPOFOL;  Surgeon: Jonathon Bellows, MD;  Location: [facility] ENDOSCOPY;  Service: Gastroenterology;  Laterality: N/A;  . FRACTURE SURGERY     foot, screws placed  . JOINT REPLACEMENT Right 2009   knee    Current Medications: Current Meds  Medication Sig  . amLODipine (NORVASC) 5 MG tablet Take 1 tablet (5 mg total) by mouth daily.  Marland Kitchen atenolol (TENORMIN) 50 MG tablet TAKE ONE TABLET BY MOUTH DAILY - NEED APPOINTMENT FOR FURTHER REFILLS  . atorvastatin (LIPITOR) 40 MG tablet TAKE ONE TABLET BY MOUTH DAILY AT 6PM  .  FLUoxetine (PROZAC) 40 MG capsule Take 1 capsule (40 mg total) by mouth daily.  Marland Kitchen losartan-hydrochlorothiazide (HYZAAR) 100-12.5 MG tablet Take 1 tablet by mouth daily.     Allergies:   Patient has no known allergies.   Social History   Socioeconomic History  . Marital status: Married    Spouse name: Not on file  . Number of children: Not on file  . Years of education: Not on file  . Highest education level: Not on file  Occupational History  . Not on file  Tobacco Use  . Smoking status: Former Smoker    Types: Cigarettes    Quit date: 01/19/1988    Years since quitting: 32.0  . Smokeless tobacco: Never Used  Vaping Use  . Vaping Use: Never used  Substance and Sexual Activity  . Alcohol use: Yes    Alcohol/week: 3.0 standard drinks    Types: 3 Shots of liquor per week    Comment: 3 drinks a week socially   . Drug use: No  . Sexual activity: Not on file  Other Topics Concern  . Not on file  Social History Narrative  . Not on file   Social Determinants of Health   Financial Resource Strain:   . Difficulty of Paying Living Expenses: Not on file  Food Insecurity:   . Worried About Charity fundraiser in the Last Year: Not on file  .  Ran Out of Food in the Last Year: Not on file  Transportation Needs:   . Lack of Transportation (Medical): Not on file  . Lack of Transportation (Non-Medical): Not on file  Physical Activity:   . Days of Exercise per Week: Not on file  . Minutes of Exercise per Session: Not on file  Stress:   . Feeling of Stress : Not on file  Social Connections:   . Frequency of Communication with Friends and Family: Not on file  . Frequency of Social Gatherings with Friends and Family: Not on file  . Attends Religious Services: Not on file  . Active Member of Clubs or Organizations: Not on file  . Attends Archivist Meetings: Not on file  . Marital Status: Not on file     Family History: The patient's family history includes AAA  (abdominal aortic aneurysm) in her father; Breast cancer (age of onset: 66) in her mother; Cancer in her mother; Heart attack in her mother; Heart disease in her father and mother; Heart failure in her mother; Hypertension in her father and sister.  ROS:   Please see the history of present illness.     All other systems reviewed and are negative.  EKGs/Labs/Other Studies Reviewed:    The following studies were reviewed today:   EKG:  EKG is  ordered today.  The ekg ordered today demonstrates normal sinus rhythm  Recent Labs: 10/24/2019: ALT 20; BUN 25; Creatinine, Ser 0.60; Potassium 4.1; Sodium 140  Recent Lipid Panel    Component Value Date/Time   CHOL 203 (H) 10/24/2019 0818   CHOL 177 04/09/2018 1124   TRIG 106.0 10/24/2019 0818   TRIG 83 04/09/2018 1124   HDL 80.40 10/24/2019 0818   HDL 109 09/28/2017 1557   CHOLHDL 3 10/24/2019 0818   VLDL 21.2 10/24/2019 0818   VLDL 17 04/09/2018 1124   LDLCALC 102 (H) 10/24/2019 0818   LDLCALC 110 (H) 09/28/2017 1557    Physical Exam:    VS:  BP 130/70 (BP Location: Left Arm, Patient Position: Sitting, Cuff Size: Normal)   Pulse 60   Ht 5\' 6"  (1.676 m)   Wt 223 lb 6 oz (101.3 kg)   SpO2 96%   BMI 36.05 kg/m     Wt Readings from Last 3 Encounters:  01/13/20 223 lb 6 oz (101.3 kg)  01/07/20 220 lb 7.4 oz (100 kg)  10/24/19 222 lb (100.7 kg)     GEN:  Well nourished, well developed in no acute distress HEENT: Normal NECK: No JVD; No carotid bruits LYMPHATICS: No lymphadenopathy CARDIAC: RRR, no murmurs, rubs, gallops RESPIRATORY:  Clear to auscultation without rales, wheezing or rhonchi  ABDOMEN: Soft, non-tender, non-distended MUSCULOSKELETAL:  No edema; No deformity  SKIN: Warm and dry NEUROLOGIC:  Alert and oriented x 3 PSYCHIATRIC:  Normal affect   ASSESSMENT:    1. Diastolic dysfunction   2. Essential hypertension   3. Pure hypercholesterolemia   4. BMI 36.0-36.9,adult    PLAN:    In order of problems listed  above:  1. Patient with atypical chest discomfort.  Patient has risk factors of hypertension, former smoker, hyperlipidemia.  Echocardiogram showed normal systolic function, EF 60 to 54%, grade 2 diastolic dysfunction.  She does not appear volume overloaded on exam.  Diastolic dysfunction could be contributing to shortness of breath on exertion.  Pulmonary etiology unlikely since she quit smoking 32 years ago and has been fine untill last several months.  Lexiscan Myoview with  no evidence for ischemia, low risk study. 2. History of hypertension, blood pressure controlled.  Continue current BP meds as prescribed. 3. History of hyperlipidemia, continue statin. 4. Patient is obese, weight loss advised.  Deconditioning/obesity could be contributing to shortness of breath in addition to diastolic dysfunction as above.  Follow-up in 6 months.  Total encounter time 35 minutes  Greater than 50% was spent in counseling and coordination of care with the patient   This note was generated in part or whole with voice recognition software. Voice recognition is usually quite accurate but there are transcription errors that can and very often do occur. I apologize for any typographical errors that were not detected and corrected.  Medication Adjustments/Labs and Tests Ordered: Current medicines are reviewed at length with the patient today.  Concerns regarding medicines are outlined above.  No orders of the defined types were placed in this encounter.  No orders of the defined types were placed in this encounter.   Patient Instructions  Medication Instructions:  Your physician recommends that you continue on your current medications as directed. Please refer to the Current Medication list given to you today.  *If you need a refill on your cardiac medications before your next appointment, please call your pharmacy*   Lab Work: None Ordered If you have labs (blood work) drawn today and your tests are  completely normal, you will receive your results only by: Marland Kitchen MyChart Message (if you have MyChart) OR . A paper copy in the mail If you have any lab test that is abnormal or we need to change your treatment, we will call you to review the results.   Testing/Procedures: None Ordered   Follow-Up: At [facility], you and your health needs are our priority.  As part of our continuing mission to provide you with exceptional heart care, we have created designated Provider Care Teams.  These Care Teams include your primary Cardiologist (physician) and Advanced Practice Providers (APPs -  Physician Assistants and Nurse Practitioners) who all work together to provide you with the care you need, when you need it.  We recommend signing up for the patient portal called "MyChart".  Sign up information is provided on this After Visit Summary.  MyChart is used to connect with patients for Virtual Visits (Telemedicine).  Patients are able to view lab/test results, encounter notes, upcoming appointments, etc.  Non-urgent messages can be sent to your provider as well.   To learn more about what you can do with MyChart, go to [email or web address].    Your next appointment:   6 month(s)  The format for your next appointment:   In Person  Provider:   Kate Sable, MD   Other Instructions      Signed, Kate Sable, MD  01/13/2020 5:15 PM    Ages

## 2020-01-14 ENCOUNTER — Ambulatory Visit (INDEPENDENT_AMBULATORY_CARE_PROVIDER_SITE_OTHER): Payer: Medicare Other | Admitting: Internal Medicine

## 2020-01-14 ENCOUNTER — Encounter: Payer: Self-pay | Admitting: Internal Medicine

## 2020-01-14 DIAGNOSIS — E785 Hyperlipidemia, unspecified: Secondary | ICD-10-CM

## 2020-01-14 DIAGNOSIS — F32A Depression, unspecified: Secondary | ICD-10-CM

## 2020-01-14 DIAGNOSIS — I10 Essential (primary) hypertension: Secondary | ICD-10-CM | POA: Diagnosis not present

## 2020-01-14 DIAGNOSIS — G4730 Sleep apnea, unspecified: Secondary | ICD-10-CM | POA: Diagnosis not present

## 2020-01-14 DIAGNOSIS — F320 Major depressive disorder, single episode, mild: Secondary | ICD-10-CM | POA: Diagnosis not present

## 2020-01-14 DIAGNOSIS — R079 Chest pain, unspecified: Secondary | ICD-10-CM | POA: Diagnosis not present

## 2020-01-14 NOTE — Progress Notes (Signed)
Patient ID: Sherry Davila, female   DOB: [birth year], 74 y.o.   MRN: [id number]   Subjective:    Patient ID: Sherry Davila, female    DOB: 01/16/1946, 74 y.o.   MRN: [id number]  HPI This visit occurred during the SARS-CoV-2 public health emergency.  Safety protocols were in place, including screening questions prior to the visit, additional usage of staff PPE, and extensive cleaning of exam room while observing appropriate contact time as indicated for disinfecting solutions.  Patient here for a scheduled follow up. Evaluated recently by cardiology: for chest pain and sob with exertion.  ECHO - normal systolic function with EF 24-09%, grade 2 diastolic dysfunction.  lexiscan myoview - no evidence for ischemia.  She reports she is doing relatively well.  Tries to stay active.  No increased cough or congestion.  Breathing stable.  Has sleep apnea.  Needs new supplies.  (uses [facility]).  She uses her cpap regularly.  States sleeps 8 hours per night.  Feels rested after using.  No abdominal pain or bowel issues reported.  Overall handling stress. On prozac.  Had colonoscopy 01/07/20 - polyp ascending colon.  Recommended f/u in 3 years.     Past Medical History:  Diagnosis Date  . Anxiety   . Depression   . Hyperlipidemia   . Hypertension   . Obesity   . Sleep apnea    has a cpap, doesn't wear   . Varicose vein    Past Surgical History:  Procedure Laterality Date  . COLONOSCOPY WITH PROPOFOL N/A 09/08/2016   Procedure: COLONOSCOPY WITH PROPOFOL;  Surgeon: Jonathon Bellows, MD;  Location: [facility] ENDOSCOPY;  Service: Endoscopy;  Laterality: N/A;  . COLONOSCOPY WITH PROPOFOL N/A 01/07/2020   Procedure: COLONOSCOPY WITH PROPOFOL;  Surgeon: Jonathon Bellows, MD;  Location: [facility] ENDOSCOPY;  Service: Gastroenterology;  Laterality: N/A;  . FRACTURE SURGERY     foot, screws placed  . JOINT REPLACEMENT Right 2009   knee   Family History  Problem Relation Age of Onset  . Cancer Mother   . Heart  failure Mother   . Heart disease Mother   . Breast cancer Mother 63  . Heart attack Mother   . AAA (abdominal aortic aneurysm) Father   . Hypertension Father   . Heart disease Father   . Hypertension Sister    Social History   Socioeconomic History  . Marital status: Married    Spouse name: Not on file  . Number of children: Not on file  . Years of education: Not on file  . Highest education level: Not on file  Occupational History  . Not on file  Tobacco Use  . Smoking status: Former Smoker    Types: Cigarettes    Quit date: 01/19/1988    Years since quitting: 32.0  . Smokeless tobacco: Never Used  Vaping Use  . Vaping Use: Never used  Substance and Sexual Activity  . Alcohol use: Yes    Alcohol/week: 3.0 standard drinks    Types: 3 Shots of liquor per week    Comment: 3 drinks a week socially   . Drug use: No  . Sexual activity: Not on file  Other Topics Concern  . Not on file  Social History Narrative  . Not on file   Social Determinants of Health   Financial Resource Strain:   . Difficulty of Paying Living Expenses: Not on file  Food Insecurity:   . Worried About Charity fundraiser in the Last  Year: Not on file  . Ran Out of Food in the Last Year: Not on file  Transportation Needs:   . Lack of Transportation (Medical): Not on file  . Lack of Transportation (Non-Medical): Not on file  Physical Activity:   . Days of Exercise per Week: Not on file  . Minutes of Exercise per Session: Not on file  Stress:   . Feeling of Stress : Not on file  Social Connections:   . Frequency of Communication with Friends and Family: Not on file  . Frequency of Social Gatherings with Friends and Family: Not on file  . Attends Religious Services: Not on file  . Active Member of Clubs or Organizations: Not on file  . Attends Archivist Meetings: Not on file  . Marital Status: Not on file    Outpatient Encounter Medications as of 01/14/2020  Medication Sig  .  amLODipine (NORVASC) 5 MG tablet Take 1 tablet (5 mg total) by mouth daily.  Marland Kitchen atenolol (TENORMIN) 50 MG tablet TAKE ONE TABLET BY MOUTH DAILY - NEED APPOINTMENT FOR FURTHER REFILLS  . atorvastatin (LIPITOR) 40 MG tablet TAKE ONE TABLET BY MOUTH DAILY AT 6PM  . FLUoxetine (PROZAC) 40 MG capsule Take 1 capsule (40 mg total) by mouth daily.  Marland Kitchen losartan-hydrochlorothiazide (HYZAAR) 100-12.5 MG tablet Take 1 tablet by mouth daily.  . [DISCONTINUED] levocetirizine (XYZAL ALLERGY 24HR) 5 MG tablet Take 0.5 tablets (2.5 mg total) by mouth every evening. (Patient not taking: Reported on 01/07/2020)   No facility-administered encounter medications on file as of 01/14/2020.    Review of Systems  Constitutional: Negative for appetite change and unexpected weight change.  HENT: Negative for congestion and sinus pressure.   Respiratory: Negative for cough and chest tightness.        Breathing stable.   Cardiovascular: Negative for palpitations and leg swelling.  Gastrointestinal: Negative for abdominal pain, diarrhea, nausea and vomiting.  Genitourinary: Negative for difficulty urinating and dysuria.  Musculoskeletal: Negative for joint swelling and myalgias.  Skin: Negative for color change and rash.  Neurological: Negative for dizziness, light-headedness and headaches.  Psychiatric/Behavioral: Negative for agitation and dysphoric mood.       Objective:    Physical Exam Vitals reviewed.  Constitutional:      General: She is not in acute distress.    Appearance: Normal appearance.  HENT:     Head: Normocephalic and atraumatic.     Right Ear: External ear normal.     Left Ear: External ear normal.  Eyes:     General: No scleral icterus.       Right eye: No discharge.        Left eye: No discharge.     Conjunctiva/sclera: Conjunctivae normal.  Neck:     Thyroid: No thyromegaly.  Cardiovascular:     Rate and Rhythm: Normal rate and regular rhythm.  Pulmonary:     Effort: No respiratory  distress.     Breath sounds: Normal breath sounds. No wheezing.  Abdominal:     General: Bowel sounds are normal.     Palpations: Abdomen is soft.     Tenderness: There is no abdominal tenderness.  Musculoskeletal:        General: No swelling or tenderness.     Cervical back: Neck supple. No tenderness.  Lymphadenopathy:     Cervical: No cervical adenopathy.  Skin:    Findings: No erythema or rash.  Neurological:     Mental Status: She is alert.  Psychiatric:        Mood and Affect: Mood normal.        Behavior: Behavior normal.     BP 132/78   Pulse 67   Temp 98.3 F (36.8 C) (Oral)   Resp 16   Ht 5\' 6"  (1.676 m)   Wt 221 lb 3.2 oz (100.3 kg)   SpO2 97%   BMI 35.70 kg/m  Wt Readings from Last 3 Encounters:  01/14/20 221 lb 3.2 oz (100.3 kg)  01/13/20 223 lb 6 oz (101.3 kg)  01/07/20 220 lb 7.4 oz (100 kg)     Lab Results  Component Value Date   WBC 5.3 12/26/2018   HGB 14.3 12/26/2018   HCT 43.4 12/26/2018   PLT 247.0 12/26/2018   GLUCOSE 85 10/24/2019   CHOL 203 (H) 10/24/2019   TRIG 106.0 10/24/2019   HDL 80.40 10/24/2019   LDLCALC 102 (H) 10/24/2019   ALT 20 10/24/2019   AST 17 10/24/2019   NA 140 10/24/2019   K 4.1 10/24/2019   CL 105 10/24/2019   CREATININE 0.60 10/24/2019   BUN 25 (H) 10/24/2019   CO2 27 10/24/2019   TSH 1.88 12/26/2018       Assessment & Plan:   Problem List Items Addressed This Visit    Sleep apnea    Using cpap.  Needs a new machine.  Uses nightly at least 8 hours per night.  Contact [facility] - to find out what we need to do to get her a new machine.        Mild depression (HCC)    On prozac.  Stable.  Follow.        Hypertension    Blood pressure as outlined.  Continue atenolol, losartan/hctz and amlodipine.  Follow pressures.  Follow metabolic panel.       Relevant Orders   CBC with Differential/Platelet   TSH   Basic metabolic panel   Hyperlipidemia    On lipitor.  Low cholesterol diet and exercise.   Follow lipid panel and liver function tests.   Lab Results  Component Value Date   CHOL 203 (H) 10/24/2019   HDL 80.40 10/24/2019   LDLCALC 102 (H) 10/24/2019   TRIG 106.0 10/24/2019   CHOLHDL 3 10/24/2019        Relevant Orders   Hepatic function panel   Lipid panel   Chest pain    Recently evaluated by cardiology.  W/up as outlined.  lexiscan myoview - negative for ischemia.  No chest pain reported today.  Continue risk factor modification.  Follow.            Einar Pheasant, MD

## 2020-01-20 ENCOUNTER — Telehealth: Payer: Self-pay | Admitting: Internal Medicine

## 2020-01-20 ENCOUNTER — Encounter: Payer: Self-pay | Admitting: Internal Medicine

## 2020-01-20 NOTE — Telephone Encounter (Signed)
Form completed for new CPAP machine.

## 2020-01-20 NOTE — Telephone Encounter (Signed)
At office visit, she mentioned she needed new cpap machine.  [facility] she uses.  I have not seen anything from them. Please contact them to see what we need to do to get her a new machine.

## 2020-01-20 NOTE — Assessment & Plan Note (Signed)
Recently evaluated by cardiology.  W/up as outlined.  lexiscan myoview - negative for ischemia.  No chest pain reported today.  Continue risk factor modification.  Follow.

## 2020-01-20 NOTE — Assessment & Plan Note (Signed)
Blood pressure as outlined.  Continue atenolol, losartan/hctz and amlodipine.  Follow pressures.  Follow metabolic panel.

## 2020-01-20 NOTE — Assessment & Plan Note (Signed)
On prozac.  Stable.  Follow.  

## 2020-01-20 NOTE — Assessment & Plan Note (Signed)
Using cpap.  Needs a new machine.  Uses nightly at least 8 hours per night.  Contact [facility] - to find out what we need to do to get her a new machine.

## 2020-01-20 NOTE — Assessment & Plan Note (Signed)
On lipitor.  Low cholesterol diet and exercise.  Follow lipid panel and liver function tests.   Lab Results  Component Value Date   CHOL 203 (H) 10/24/2019   HDL 80.40 10/24/2019   LDLCALC 102 (H) 10/24/2019   TRIG 106.0 10/24/2019   CHOLHDL 3 10/24/2019

## 2020-02-05 ENCOUNTER — Telehealth: Payer: Self-pay | Admitting: Internal Medicine

## 2020-02-05 NOTE — Telephone Encounter (Signed)
[facility] Called to see if we received a fax from them requesting face-to-face notes be faxed to them  [facility] will Fax info over

## 2020-02-05 NOTE — Telephone Encounter (Signed)
Face to face note has been completed. Please send info.

## 2020-02-11 NOTE — Telephone Encounter (Signed)
Sherry Davila from [facility] says that have not received the note from Dr. Nicki Davila. He would like a call back.

## 2020-02-14 NOTE — Telephone Encounter (Signed)
LM for [facility]. Note refaxed.

## 2020-02-20 DIAGNOSIS — Z23 Encounter for immunization: Secondary | ICD-10-CM | POA: Diagnosis not present

## 2020-02-21 NOTE — Telephone Encounter (Signed)
Sherry Davila from [facility] called stated that they did not receive the face to face note wanted to know if you could refax it to [phone]

## 2020-02-25 NOTE — Telephone Encounter (Signed)
Note refaxed.

## 2020-02-27 NOTE — Telephone Encounter (Signed)
Wells Guiles at [facility] called and said that they still haven't received the fax. I informed them that it has been faxed several times   Please fax to [phone]

## 2020-02-28 NOTE — Telephone Encounter (Signed)
Refaxed note again to [facility] at number provided below/

## 2020-03-13 NOTE — Telephone Encounter (Signed)
AJ said they still haven't received the face-to face notes. He said please make sure they are being faxed to [phone]. He will resend his forms.

## 2020-03-16 NOTE — Telephone Encounter (Signed)
Notes have been faxed again for Face to face.

## 2020-03-17 ENCOUNTER — Telehealth: Payer: Self-pay | Admitting: Internal Medicine

## 2020-03-17 NOTE — Telephone Encounter (Signed)
[facility] faxed fo rface to face compliance notes for CPAP , notes faxed 03/17/20 to [facility].

## 2020-04-15 ENCOUNTER — Other Ambulatory Visit: Payer: Self-pay | Admitting: Internal Medicine

## 2020-04-15 ENCOUNTER — Other Ambulatory Visit (INDEPENDENT_AMBULATORY_CARE_PROVIDER_SITE_OTHER): Payer: Medicare Other

## 2020-04-15 ENCOUNTER — Other Ambulatory Visit: Payer: Self-pay

## 2020-04-15 DIAGNOSIS — I10 Essential (primary) hypertension: Secondary | ICD-10-CM

## 2020-04-15 DIAGNOSIS — E785 Hyperlipidemia, unspecified: Secondary | ICD-10-CM | POA: Diagnosis not present

## 2020-04-15 DIAGNOSIS — F3342 Major depressive disorder, recurrent, in full remission: Secondary | ICD-10-CM

## 2020-04-15 LAB — LIPID PANEL
Cholesterol: 178 mg/dL (ref 0–200)
HDL: 94 mg/dL (ref >39.00)
LDL Cholesterol: 69 mg/dL (ref 0–99)
NonHDL: 84.01
Total CHOL/HDL Ratio: 2
Triglycerides: 77 mg/dL (ref 0.0–149.0)
VLDL: 15.4 mg/dL (ref 0.0–40.0)

## 2020-04-15 LAB — CBC WITH DIFFERENTIAL/PLATELET
Basophils Absolute: 0.1 10*3/uL (ref 0.0–0.1)
Basophils Relative: 1.3 % (ref 0.0–3.0)
Eosinophils Absolute: 0.5 10*3/uL (ref 0.0–0.7)
Eosinophils Relative: 8.8 % — ABNORMAL HIGH (ref 0.0–5.0)
HCT: 39.8 % (ref 36.0–46.0)
Hemoglobin: 13.3 g/dL (ref 12.0–15.0)
Lymphocytes Relative: 33 % (ref 12.0–46.0)
Lymphs Abs: 1.9 10*3/uL (ref 0.7–4.0)
MCHC: 33.6 g/dL (ref 30.0–36.0)
MCV: 90.3 fl (ref 78.0–100.0)
Monocytes Absolute: 0.7 10*3/uL (ref 0.1–1.0)
Monocytes Relative: 12.4 % — ABNORMAL HIGH (ref 3.0–12.0)
Neutro Abs: 2.6 10*3/uL (ref 1.4–7.7)
Neutrophils Relative %: 44.5 % (ref 43.0–77.0)
Platelets: 230 10*3/uL (ref 150.0–400.0)
RBC: 4.4 Mil/uL (ref 3.87–5.11)
RDW: 13.9 % (ref 11.5–15.5)
WBC: 5.8 10*3/uL (ref 4.0–10.5)

## 2020-04-15 LAB — BASIC METABOLIC PANEL
BUN: 25 mg/dL — ABNORMAL HIGH (ref 6–23)
CO2: 28 mEq/L (ref 19–32)
Calcium: 9.1 mg/dL (ref 8.4–10.5)
Chloride: 108 mEq/L (ref 96–112)
Creatinine, Ser: 0.69 mg/dL (ref 0.40–1.20)
GFR: 86.37 mL/min (ref >60.00)
Glucose, Bld: 95 mg/dL (ref 70–99)
Potassium: 4.4 mEq/L (ref 3.5–5.1)
Sodium: 144 mEq/L (ref 135–145)

## 2020-04-15 LAB — TSH: TSH: 2.78 u[IU]/mL (ref 0.35–4.50)

## 2020-04-15 LAB — HEPATIC FUNCTION PANEL
ALT: 18 U/L (ref 0–35)
AST: 18 U/L (ref 0–37)
Albumin: 4.1 g/dL (ref 3.5–5.2)
Alkaline Phosphatase: 104 U/L (ref 39–117)
Bilirubin, Direct: 0.1 mg/dL (ref 0.0–0.3)
Total Bilirubin: 0.5 mg/dL (ref 0.2–1.2)
Total Protein: 6.4 g/dL (ref 6.0–8.3)

## 2020-04-20 ENCOUNTER — Ambulatory Visit (INDEPENDENT_AMBULATORY_CARE_PROVIDER_SITE_OTHER): Payer: Medicare Other | Admitting: Internal Medicine

## 2020-04-20 VITALS — BP 130/69 | HR 60 | Temp 97.9°F | Resp 16 | Ht 65.0 in | Wt 225.0 lb

## 2020-04-20 DIAGNOSIS — I10 Essential (primary) hypertension: Secondary | ICD-10-CM | POA: Diagnosis not present

## 2020-04-20 DIAGNOSIS — G2581 Restless legs syndrome: Secondary | ICD-10-CM | POA: Diagnosis not present

## 2020-04-20 DIAGNOSIS — E669 Obesity, unspecified: Secondary | ICD-10-CM

## 2020-04-20 DIAGNOSIS — G4730 Sleep apnea, unspecified: Secondary | ICD-10-CM

## 2020-04-20 NOTE — Patient Instructions (Signed)

## 2020-04-20 NOTE — Progress Notes (Signed)
[facility] [place], [place] [address]  Pulmonary Sleep Medicine   Office Visit Note  Patient Name: Sherry Davila DOB: [birth year] MRN [id number]    Chief Complaint: Obstructive Sleep Apnea visit  Brief History:  Sherry Davila is seen today for initial consultation. The patient has a 6 year history of sleep apnea. Patient is using PAP nightly.  She denies ever missing any nights and her husband would never let her sleep without it.  The patient feels more rested after sleeping with PAP.  The patient reports benefiting from PAP use. Reported sleepiness is  improved and the Epworth Sleepiness Score is 10 out of 24. The patient occasionally takes naps if she has had a bad night of sleep. The patient complains of the following: no complaints.  The compliance download shows 9.4poor compliance with an average use time of 9.4 hours. The AHI is 1.4  The patient reports RLS symptoms which begin when watching TV. She notices the symptoms in bed as well.  She has been taking a "night" medicine. She denies much caffeine use and consumption of red wine. ROS  General: (-) fever, (-) chills, (-) night sweat Nose and Sinuses: (-) nasal stuffiness or itchiness, (-) postnasal drip, (-) nosebleeds, (-) sinus trouble. Mouth and Throat: (-) sore throat, (-) hoarseness. Neck: (-) swollen glands, (-) enlarged thyroid, (-) neck pain. Respiratory: - cough, - shortness of breath, - wheezing. Neurologic: - numbness, - tingling. Psychiatric: - anxiety, - depression   Current Medication: Outpatient Encounter Medications as of 04/20/2020  Medication Sig  . amLODipine (NORVASC) 5 MG tablet Take 1 tablet (5 mg total) by mouth daily.  Marland Kitchen atenolol (TENORMIN) 50 MG tablet TAKE ONE TABLET BY MOUTH DAILY  . atorvastatin (LIPITOR) 40 MG tablet TAKE ONE TABLET BY MOUTH DAILY AT 6PM  . FLUoxetine (PROZAC) 40 MG capsule TAKE ONE CAPSULE BY MOUTH DAILY  . losartan-hydrochlorothiazide (HYZAAR)  100-12.5 MG tablet TAKE ONE TABLET BY MOUTH DAILY  . [DISCONTINUED] atenolol (TENORMIN) 50 MG tablet TAKE ONE TABLET BY MOUTH DAILY - NEED APPOINTMENT FOR FURTHER REFILLS  . [DISCONTINUED] FLUoxetine (PROZAC) 40 MG capsule Take 1 capsule (40 mg total) by mouth daily.  . [DISCONTINUED] losartan-hydrochlorothiazide (HYZAAR) 100-12.5 MG tablet Take 1 tablet by mouth daily.   No facility-administered encounter medications on file as of 04/20/2020.    Surgical History: Past Surgical History:  Procedure Laterality Date  . COLONOSCOPY WITH PROPOFOL N/A 09/08/2016   Procedure: COLONOSCOPY WITH PROPOFOL;  Surgeon: Jonathon Bellows, MD;  Location: [facility] ENDOSCOPY;  Service: Endoscopy;  Laterality: N/A;  . COLONOSCOPY WITH PROPOFOL N/A 01/07/2020   Procedure: COLONOSCOPY WITH PROPOFOL;  Surgeon: Jonathon Bellows, MD;  Location: [facility] ENDOSCOPY;  Service: Gastroenterology;  Laterality: N/A;  . FRACTURE SURGERY     foot, screws placed  . JOINT REPLACEMENT Right 2009   knee    Medical History: Past Medical History:  Diagnosis Date  . Anxiety   . Depression   . Hyperlipidemia   . Hypertension   . Obesity   . Sleep apnea    has a cpap, doesn't wear   . Varicose vein     Family History: Non contributory to the present illness  Social History: Social History   Socioeconomic History  . Marital status: Married    Spouse name: Not on file  . Number of children: Not on file  . Years of education: Not on file  . Highest education level: Not on file  Occupational History  .  Not on file  Tobacco Use  . Smoking status: Former Smoker    Types: Cigarettes    Quit date: 01/19/1988    Years since quitting: 32.2  . Smokeless tobacco: Never Used  Vaping Use  . Vaping Use: Never used  Substance and Sexual Activity  . Alcohol use: Yes    Alcohol/week: 3.0 standard drinks    Types: 3 Shots of liquor per week    Comment: 3 drinks a week socially   . Drug use: No  . Sexual activity: Not on file  Other  Topics Concern  . Not on file  Social History Narrative  . Not on file   Social Determinants of Health   Financial Resource Strain:   . Difficulty of Paying Living Expenses: Not on file  Food Insecurity:   . Worried About Charity fundraiser in the Last Year: Not on file  . Ran Out of Food in the Last Year: Not on file  Transportation Needs:   . Lack of Transportation (Medical): Not on file  . Lack of Transportation (Non-Medical): Not on file  Physical Activity:   . Days of Exercise per Week: Not on file  . Minutes of Exercise per Session: Not on file  Stress:   . Feeling of Stress : Not on file  Social Connections:   . Frequency of Communication with Friends and Family: Not on file  . Frequency of Social Gatherings with Friends and Family: Not on file  . Attends Religious Services: Not on file  . Active Member of Clubs or Organizations: Not on file  . Attends Archivist Meetings: Not on file  . Marital Status: Not on file  Intimate Partner Violence:   . Fear of Current or Ex-Partner: Not on file  . Emotionally Abused: Not on file  . Physically Abused: Not on file  . Sexually Abused: Not on file    Vital Signs: Blood pressure 130/69, pulse 60, temperature 97.9 F (36.6 C), temperature source Temporal, resp. rate 16, height 5\' 5"  (1.651 m), weight 225 lb (102.1 kg), SpO2 97 %.  Examination: General Appearance: The patient is well-developed, well-nourished, and in no distress. Neck Circumference: 37 Skin: Gross inspection of skin unremarkable. Head: normocephalic, no gross deformities. Eyes: no gross deformities noted. ENT: ears appear grossly normal Neurologic: Alert and oriented. No involuntary movements.    EPWORTH SLEEPINESS SCALE:  Scale:  (0)= no chance of dozing; (1)= slight chance of dozing; (2)= moderate chance of dozing; (3)= high chance of dozing  Chance  Situtation    Sitting and reading: 2    Watching TV: 1    Sitting Inactive in  public: 1    As a passenger in car: 1      Lying down to rest: 3    Sitting and talking: 0    Sitting quielty after lunch: 2    In a car, stopped in traffic: 0   TOTAL SCORE:   10 out of 24     SLEEP STUDIES:  1. Split 02/18/14 AHI 25 SpO9min 86%   CPAP COMPLIANCE DATA:  Date Range: 03/22/20-04/20/20  Average Daily Use: 9.3 hours  Median Use: 9.5  Compliance for > 4 Hours: 67%- machine is not tracking properly  AHI: 1.4 respiratory events per hour  Days Used: 20/30  Mask Leak: 24.2  95th Percentile Pressure: 10         LABS: Recent Results (from the past 2160 hour(s))  Basic metabolic panel  Status: Abnormal   Collection Time: 04/15/20  8:42 AM  Result Value Ref Range   Sodium 144 135 - 145 mEq/L   Potassium 4.4 3.5 - 5.1 mEq/L   Chloride 108 96 - 112 mEq/L   CO2 28 19 - 32 mEq/L   Glucose, Bld 95 70 - 99 mg/dL   BUN 25 (H) 6 - 23 mg/dL   Creatinine, Ser 0.69 0.40 - 1.20 mg/dL   GFR 86.37 >60.00 mL/min    Comment: Calculated using the CKD-EPI Creatinine Equation (2021)   Calcium 9.1 8.4 - 10.5 mg/dL  TSH     Status: None   Collection Time: 04/15/20  8:42 AM  Result Value Ref Range   TSH 2.78 0.35 - 4.50 uIU/mL  Lipid panel     Status: None   Collection Time: 04/15/20  8:42 AM  Result Value Ref Range   Cholesterol 178 0 - 200 mg/dL    Comment: ATP III Classification       Desirable:  < 200 mg/dL               Borderline High:  200 - 239 mg/dL          High:  > = 240 mg/dL   Triglycerides 77.0 0 - 149 mg/dL    Comment: Normal:  <150 mg/dLBorderline High:  150 - 199 mg/dL   HDL 94.00 >39.00 mg/dL   VLDL 15.4 0.0 - 40.0 mg/dL   LDL Cholesterol 69 0 - 99 mg/dL   Total CHOL/HDL Ratio 2     Comment:                Men          Women1/2 Average Risk     3.4          3.3Average Risk          5.0          4.42X Average Risk          9.6          7.13X Average Risk          15.0          11.0                       NonHDL 84.01     Comment: NOTE:   Non-HDL goal should be 30 mg/dL higher than patient's LDL goal (i.e. LDL goal of < 70 mg/dL, would have non-HDL goal of < 100 mg/dL)  Hepatic function panel     Status: None   Collection Time: 04/15/20  8:42 AM  Result Value Ref Range   Total Bilirubin 0.5 0.2 - 1.2 mg/dL   Bilirubin, Direct 0.1 0.0 - 0.3 mg/dL   Alkaline Phosphatase 104 39 - 117 U/L   AST 18 0 - 37 U/L   ALT 18 0 - 35 U/L   Total Protein 6.4 6.0 - 8.3 g/dL   Albumin 4.1 3.5 - 5.2 g/dL  CBC with Differential/Platelet     Status: Abnormal   Collection Time: 04/15/20  8:42 AM  Result Value Ref Range   WBC 5.8 4.0 - 10.5 K/uL   RBC 4.40 3.87 - 5.11 Mil/uL   Hemoglobin 13.3 12.0 - 15.0 g/dL   HCT 39.8 36 - 46 %   MCV 90.3 78.0 - 100.0 fl   MCHC 33.6 30.0 - 36.0 g/dL   RDW 13.9 11.5 - 15.5 %   Platelets 230.0  150 - 400 K/uL   Neutrophils Relative % 44.5 43 - 77 %   Lymphocytes Relative 33.0 12 - 46 %   Monocytes Relative 12.4 (H) 3 - 12 %   Eosinophils Relative 8.8 (H) 0 - 5 %   Basophils Relative 1.3 0 - 3 %   Neutro Abs 2.6 1.4 - 7.7 K/uL   Lymphs Abs 1.9 0.7 - 4.0 K/uL   Monocytes Absolute 0.7 0.1 - 1.0 K/uL   Eosinophils Absolute 0.5 0.0 - 0.7 K/uL   Basophils Absolute 0.1 0.0 - 0.1 K/uL    Radiology: No results found.  No results found.  No results found.    Assessment and Plan: Patient Active Problem List   Diagnosis Date Noted  . Obesity (BMI 30-39.9) 04/20/2020  . Acid reflux 10/13/2019  . Chest pain 10/11/2019  . Healthcare maintenance 10/01/2019  . Constipation 10/28/2018  . Personal history of other malignant neoplasm of skin 08/06/2018  . Sleep apnea 09/28/2017  . Atrophic vaginitis 01/19/2015  . Hypertension   . Hyperlipidemia   . Anxiety   . Mild depression ([place])       The patient does tolerate PAP and reports significiant benefit from PAP use. The patient was reminded how to clean her CPAP and advised to have her iron and ferritin checked due to RLS. The patient was also  counselled on regular exercise and weight loss. The compliance is excellent although the machine is not tracking properly. The apnea is well controlled.   1. OSA- continue excellent compliance 2. RLS- will get iron and ferritin studies.  3. HTN - controlled on  Medication and managed by PCP.  4. OBESITY:  Discussed the importance of weight management through healthy eating and daily exercise as tolerated. Discussed the negative effects obesity has on pulmonary health, cardiac health as well as overall general health and well being.   General Counseling: I have discussed the findings of the evaluation and examination with Sherry Davila.  I have also discussed any further diagnostic evaluation thatmay be needed or ordered today. Sherry Davila verbalizes understanding of the findings of todays visit. We also reviewed her medications today and discussed drug interactions and side effects including but not limited excessive drowsiness and altered mental states. We also discussed that there is always a risk not just to her but also people around her. she has been encouraged to call the office with any questions or concerns that should arise related to todays visit.  No orders of the defined types were placed in this encounter.   This patient was seen by Shannan Harper, AGNP-C in collaboration with Dr. Devona Konig as a part of collaborative care agreement.       I have personally obtained a history, examined the patient, evaluated laboratory and imaging results, formulated the assessment and plan and placed orders.   Richelle Ito Saunders Glance, PhD, FAASM  Diplomate, American Board of Sleep Medicine    Allyne Gee, MD [facility] Diplomate ABMS Pulmonary and Critical Care Medicine Sleep medicine

## 2020-05-26 ENCOUNTER — Other Ambulatory Visit: Payer: Self-pay

## 2020-05-26 ENCOUNTER — Ambulatory Visit (INDEPENDENT_AMBULATORY_CARE_PROVIDER_SITE_OTHER): Payer: Medicare Other | Admitting: Internal Medicine

## 2020-05-26 DIAGNOSIS — G4730 Sleep apnea, unspecified: Secondary | ICD-10-CM

## 2020-05-26 DIAGNOSIS — F320 Major depressive disorder, single episode, mild: Secondary | ICD-10-CM | POA: Diagnosis not present

## 2020-05-26 DIAGNOSIS — Z0000 Encounter for general adult medical examination without abnormal findings: Secondary | ICD-10-CM

## 2020-05-26 DIAGNOSIS — E669 Obesity, unspecified: Secondary | ICD-10-CM

## 2020-05-26 DIAGNOSIS — I10 Essential (primary) hypertension: Secondary | ICD-10-CM

## 2020-05-26 DIAGNOSIS — E785 Hyperlipidemia, unspecified: Secondary | ICD-10-CM

## 2020-05-26 DIAGNOSIS — F32A Depression, unspecified: Secondary | ICD-10-CM

## 2020-05-26 NOTE — Progress Notes (Signed)
Patient ID: Sherry Davila, female   DOB: [birth year], 75 y.o.   MRN: [id number]   Subjective:    Patient ID: Sherry Davila, female    DOB: 11-10-1945, 75 y.o.   MRN: [id number]  HPI This visit occurred during the SARS-CoV-2 public health emergency.  Safety protocols were in place, including screening questions prior to the visit, additional usage of staff PPE, and extensive cleaning of exam room while observing appropriate contact time as indicated for disinfecting solutions.  Patient with past history of hypercholesterolemia and hypertension.  She comes in today to follow up on these issues as well as for a complete physical exam.  She reports she is doing relatively well.  Getting back in to the gym.  Some pain left posterior shoulder blade.  Aggravated by movement.  Desires to monitor.  No chest pain or sob reported.  No abdominal pain or bowel change reported.  Discussed diet and exercise.  Does have problems with restless legs.  Also reports legs aching at night.  Discussed wearing compression hose, to help with aching.  Has a history of smoking.  Smoked for 20 years - 11/2 ppd.  Quit smoking in her 60s.  Had colonoscopy 12/2019.  Recommended f/u in 3 years.  Having trouble sleeping.  Wakes up at night.  Using cpap and is compliant with using.  Saw pulmonary.    Past Medical History:  Diagnosis Date  . Anxiety   . Depression   . Hyperlipidemia   . Hypertension   . Obesity   . Sleep apnea    has a cpap, doesn't wear   . Varicose vein    Past Surgical History:  Procedure Laterality Date  . COLONOSCOPY WITH PROPOFOL N/A 09/08/2016   Procedure: COLONOSCOPY WITH PROPOFOL;  Surgeon: Jonathon Bellows, MD;  Location: [facility] ENDOSCOPY;  Service: Endoscopy;  Laterality: N/A;  . COLONOSCOPY WITH PROPOFOL N/A 01/07/2020   Procedure: COLONOSCOPY WITH PROPOFOL;  Surgeon: Jonathon Bellows, MD;  Location: [facility] ENDOSCOPY;  Service: Gastroenterology;  Laterality: N/A;  . FRACTURE SURGERY     foot, screws  placed  . JOINT REPLACEMENT Right 2009   knee   Family History  Problem Relation Age of Onset  . Cancer Mother   . Heart failure Mother   . Heart disease Mother   . Breast cancer Mother 74  . Heart attack Mother   . AAA (abdominal aortic aneurysm) Father   . Hypertension Father   . Heart disease Father   . Hypertension Sister    Social History   Socioeconomic History  . Marital status: Married    Spouse name: Not on file  . Number of children: Not on file  . Years of education: Not on file  . Highest education level: Not on file  Occupational History  . Not on file  Tobacco Use  . Smoking status: Former Smoker    Types: Cigarettes    Quit date: 01/19/1988    Years since quitting: 32.3  . Smokeless tobacco: Never Used  Vaping Use  . Vaping Use: Never used  Substance and Sexual Activity  . Alcohol use: Yes    Alcohol/week: 3.0 standard drinks    Types: 3 Shots of liquor per week    Comment: 3 drinks a week socially   . Drug use: No  . Sexual activity: Not on file  Other Topics Concern  . Not on file  Social History Narrative  . Not on file   Social Determinants of Health  Financial Resource Strain: Not on file  Food Insecurity: Not on file  Transportation Needs: Not on file  Physical Activity: Not on file  Stress: Not on file  Social Connections: Not on file    Outpatient Encounter Medications as of 05/26/2020  Medication Sig  . amLODipine (NORVASC) 5 MG tablet Take 1 tablet (5 mg total) by mouth daily.  Marland Kitchen atenolol (TENORMIN) 50 MG tablet TAKE ONE TABLET BY MOUTH DAILY  . atorvastatin (LIPITOR) 40 MG tablet TAKE ONE TABLET BY MOUTH DAILY AT 6PM  . FLUoxetine (PROZAC) 40 MG capsule TAKE ONE CAPSULE BY MOUTH DAILY  . losartan-hydrochlorothiazide (HYZAAR) 100-12.5 MG tablet TAKE ONE TABLET BY MOUTH DAILY   No facility-administered encounter medications on file as of 05/26/2020.    Review of Systems  Constitutional: Negative for appetite change and unexpected  weight change.  HENT: Negative for congestion, sinus pressure and sore throat.   Eyes: Negative for pain and visual disturbance.  Respiratory: Negative for cough, chest tightness and shortness of breath.   Cardiovascular: Negative for chest pain, palpitations and leg swelling.  Gastrointestinal: Negative for abdominal pain, diarrhea, nausea and vomiting.  Genitourinary: Negative for difficulty urinating and dysuria.  Musculoskeletal: Negative for joint swelling and myalgias.       Left posterior shoulder blade discomfort.  Leg aching as outlined.   Skin: Negative for color change and rash.  Neurological: Negative for dizziness, light-headedness and headaches.  Hematological: Negative for adenopathy. Does not bruise/bleed easily.  Psychiatric/Behavioral: Negative for agitation and dysphoric mood.       Objective:    Physical Exam Constitutional:      General: She is not in acute distress.    Appearance: Normal appearance. She is well-developed and well-nourished.  HENT:     Head: Normocephalic and atraumatic.     Right Ear: External ear normal.     Left Ear: External ear normal.     Mouth/Throat:     Mouth: Oropharynx is clear and moist.  Eyes:     General: No scleral icterus.       Right eye: No discharge.        Left eye: No discharge.     Conjunctiva/sclera: Conjunctivae normal.  Neck:     Thyroid: No thyromegaly.  Cardiovascular:     Rate and Rhythm: Normal rate and regular rhythm.  Pulmonary:     Effort: No tachypnea, accessory muscle usage or respiratory distress.     Breath sounds: Normal breath sounds. No decreased breath sounds or wheezing.  Chest:  Breasts:     Right: No inverted nipple, mass, nipple discharge or tenderness (no axillary adenopathy).     Left: No inverted nipple, mass, nipple discharge or tenderness (no axilarry adenopathy).    Abdominal:     General: Bowel sounds are normal.     Palpations: Abdomen is soft.     Tenderness: There is no  abdominal tenderness.  Musculoskeletal:        General: No swelling, tenderness or edema.     Cervical back: Neck supple. No tenderness.     Comments: DP pulses palpable and equal bilateral.   Lymphadenopathy:     Cervical: No cervical adenopathy.  Skin:    Findings: No erythema or rash.  Neurological:     Mental Status: She is alert and oriented to person, place, and time.  Psychiatric:        Mood and Affect: Mood and affect and mood normal.  Behavior: Behavior normal.     BP 130/70   Pulse 65   Temp 98.6 F (37 C) (Oral)   Resp 16   Ht 5\' 5"  (1.651 m)   Wt 229 lb (103.9 kg)   SpO2 98%   BMI 38.11 kg/m  Wt Readings from Last 3 Encounters:  05/26/20 229 lb (103.9 kg)  04/20/20 225 lb (102.1 kg)  01/14/20 221 lb 3.2 oz (100.3 kg)     Lab Results  Component Value Date   WBC 5.8 04/15/2020   HGB 13.3 04/15/2020   HCT 39.8 04/15/2020   PLT 230.0 04/15/2020   GLUCOSE 95 04/15/2020   CHOL 178 04/15/2020   TRIG 77.0 04/15/2020   HDL 94.00 04/15/2020   LDLCALC 69 04/15/2020   ALT 18 04/15/2020   AST 18 04/15/2020   NA 144 04/15/2020   K 4.4 04/15/2020   CL 108 04/15/2020   CREATININE 0.69 04/15/2020   BUN 25 (H) 04/15/2020   CO2 28 04/15/2020   TSH 2.78 04/15/2020       Assessment & Plan:   Problem List Items Addressed This Visit    Hypertension    Blood pressure as outlined.  Continue atenolol, losartan/hctz and amlodipine.  Metabolic panel [id number] - wnl.  Follow pressures.        Relevant Orders   Basic metabolic panel   Hyperlipidemia    On lipitor.  Low cholesterol diet and exercise.  LDL 69 - last check.  Follow lipid panel and liver function tests.       Relevant Orders   Hepatic function panel   Lipid panel   Mild depression ([place])    On prozac.  Stable.  Trouble sleeping.  Discussed treatment options.  Continue cpap.       Sleep apnea    Saw pulmonary.  Using cpap regularly.  Trouble sleeping.  Takings otc benadryl at night.   Discussed other treatment options.        Healthcare maintenance    Physical today 05/26/20.  Mammogram 10/01/19 - Briads I.  Colonoscopy 12/2019.  Recommended f/u colonoscopy in 3 years.       Obesity (BMI 30-39.9)    Diet and exercise.           Einar Pheasant, MD

## 2020-05-30 ENCOUNTER — Encounter: Payer: Self-pay | Admitting: Internal Medicine

## 2020-05-30 NOTE — Assessment & Plan Note (Signed)
On lipitor.  Low cholesterol diet and exercise.  LDL 69 - last check.  Follow lipid panel and liver function tests.

## 2020-05-30 NOTE — Assessment & Plan Note (Signed)
Saw pulmonary.  Using cpap regularly.  Trouble sleeping.  Takings otc benadryl at night.  Discussed other treatment options.

## 2020-05-30 NOTE — Assessment & Plan Note (Signed)
On prozac.  Stable.  Trouble sleeping.  Discussed treatment options.  Continue cpap.

## 2020-05-30 NOTE — Assessment & Plan Note (Signed)
Physical today 05/26/20.  Mammogram 10/01/19 - Briads I.  Colonoscopy 12/2019.  Recommended f/u colonoscopy in 3 years.

## 2020-05-30 NOTE — Assessment & Plan Note (Signed)
Blood pressure as outlined.  Continue atenolol, losartan/hctz and amlodipine.  Metabolic panel 29/19/16 - wnl.  Follow pressures.

## 2020-05-30 NOTE — Assessment & Plan Note (Signed)
Diet and exercise.   

## 2020-06-01 ENCOUNTER — Telehealth: Payer: Self-pay | Admitting: Internal Medicine

## 2020-06-01 NOTE — Telephone Encounter (Signed)
My chart message sent to pt regarding starting trazodone.

## 2020-06-06 ENCOUNTER — Other Ambulatory Visit: Payer: Self-pay | Admitting: Internal Medicine

## 2020-06-06 DIAGNOSIS — I10 Essential (primary) hypertension: Secondary | ICD-10-CM

## 2020-07-20 ENCOUNTER — Other Ambulatory Visit: Payer: Self-pay | Admitting: Internal Medicine

## 2020-07-20 ENCOUNTER — Ambulatory Visit (INDEPENDENT_AMBULATORY_CARE_PROVIDER_SITE_OTHER): Payer: Medicare Other | Admitting: Cardiology

## 2020-07-20 ENCOUNTER — Other Ambulatory Visit: Payer: Self-pay

## 2020-07-20 ENCOUNTER — Encounter: Payer: Self-pay | Admitting: Cardiology

## 2020-07-20 VITALS — BP 138/82 | HR 59 | Ht 66.0 in | Wt 231.0 lb

## 2020-07-20 DIAGNOSIS — I10 Essential (primary) hypertension: Secondary | ICD-10-CM | POA: Diagnosis not present

## 2020-07-20 DIAGNOSIS — E7800 Pure hypercholesterolemia, unspecified: Secondary | ICD-10-CM

## 2020-07-20 DIAGNOSIS — Z6837 Body mass index (BMI) 37.0-37.9, adult: Secondary | ICD-10-CM | POA: Diagnosis not present

## 2020-07-20 NOTE — Progress Notes (Signed)
Cardiology Office Note:    Date:  07/20/2020   ID:  Sherry Davila, DOB 03/17/1946, MRN [id number]  PCP:  Einar Pheasant, MD  [facility] Cardiologist:  Kate Sable, MD  [facility] Electrophysiologist:  None   Referring MD: Einar Pheasant, MD   Chief Complaint  Patient presents with  . Other    6 month f/u no complaints today. Meds reviewed verbally with pt.    History of Present Illness:    Sherry Davila is a 75 y.o. female with a hx of anxiety, hypertension, hyperlipidemia, obesity, sleep apnea, former smoker x20 years who presents for follow-up.    Previously seen due to shortness of breath. She underwent echocardiogram previously which showed diastolic dysfunction, and Lexiscan Myoview which showed no evidence for ischemia. Symptoms were attributed to deconditioning/obesity. Weight loss was advised. She presents today for 6 months follow-up, has no new concerns, taking medications as prescribed.  Occasionally has shortness of breath when she overexerts herself.  Denies any edema, chest pain.  She is working on eating healthier, endorses not eating low calorie diet to lose weight.  Prior notes Echo 08/4965, normal systolic function EF 60 to 59%, grade 2 diastolic dysfunction. Lexiscan Myoview 10/2019, no evidence for ischemia.   Past Medical History:  Diagnosis Date  . Anxiety   . Depression   . Hyperlipidemia   . Hypertension   . Obesity   . Sleep apnea    has a cpap, doesn't wear   . Varicose vein     Past Surgical History:  Procedure Laterality Date  . COLONOSCOPY WITH PROPOFOL N/A 09/08/2016   Procedure: COLONOSCOPY WITH PROPOFOL;  Surgeon: Jonathon Bellows, MD;  Location: [facility] ENDOSCOPY;  Service: Endoscopy;  Laterality: N/A;  . COLONOSCOPY WITH PROPOFOL N/A 01/07/2020   Procedure: COLONOSCOPY WITH PROPOFOL;  Surgeon: Jonathon Bellows, MD;  Location: [facility] ENDOSCOPY;  Service: Gastroenterology;  Laterality: N/A;  . FRACTURE SURGERY     foot, screws  placed  . JOINT REPLACEMENT Right 2009   knee    Current Medications: Current Meds  Medication Sig  . amLODipine (NORVASC) 5 MG tablet TAKE ONE TABLET BY MOUTH DAILY  . atenolol (TENORMIN) 50 MG tablet TAKE ONE TABLET BY MOUTH DAILY  . atorvastatin (LIPITOR) 40 MG tablet TAKE ONE TABLET BY MOUTH DAILY AT 6 P.M. - NEED APPOINTMENT WITH PCP FOR FURTHER REFILLS  . FLUoxetine (PROZAC) 40 MG capsule TAKE ONE CAPSULE BY MOUTH DAILY  . losartan-hydrochlorothiazide (HYZAAR) 100-12.5 MG tablet TAKE ONE TABLET BY MOUTH DAILY     Allergies:   Patient has no known allergies.   Social History   Socioeconomic History  . Marital status: Married    Spouse name: Not on file  . Number of children: Not on file  . Years of education: Not on file  . Highest education level: Not on file  Occupational History  . Not on file  Tobacco Use  . Smoking status: Former Smoker    Types: Cigarettes    Quit date: 01/19/1988    Years since quitting: 32.5  . Smokeless tobacco: Never Used  Vaping Use  . Vaping Use: Never used  Substance and Sexual Activity  . Alcohol use: Yes    Alcohol/week: 3.0 standard drinks    Types: 3 Shots of liquor per week    Comment: 3 drinks a week socially   . Drug use: No  . Sexual activity: Not on file  Other Topics Concern  . Not on file  Social  History Narrative  . Not on file   Social Determinants of Health   Financial Resource Strain: Not on file  Food Insecurity: Not on file  Transportation Needs: Not on file  Physical Activity: Not on file  Stress: Not on file  Social Connections: Not on file     Family History: The patient's family history includes AAA (abdominal aortic aneurysm) in her father; Breast cancer (age of onset: 73) in her mother; Cancer in her mother; Heart attack in her mother; Heart disease in her father and mother; Heart failure in her mother; Hypertension in her father and sister.  ROS:   Please see the history of present illness.     All  other systems reviewed and are negative.  EKGs/Labs/Other Studies Reviewed:    The following studies were reviewed today:   EKG:  EKG is  ordered today.  The ekg ordered today demonstrates sinus bradycardia, otherwise normal  Recent Labs: 04/15/2020: ALT 18; BUN 25; Creatinine, Ser 0.69; Hemoglobin 13.3; Platelets 230.0; Potassium 4.4; Sodium 144; TSH 2.78  Recent Lipid Panel    Component Value Date/Time   CHOL 178 04/15/2020 0842   CHOL 177 04/09/2018 1124   TRIG 77.0 04/15/2020 0842   TRIG 83 04/09/2018 1124   HDL 94.00 04/15/2020 0842   HDL 109 09/28/2017 1557   CHOLHDL 2 04/15/2020 0842   VLDL 15.4 04/15/2020 0842   VLDL 17 04/09/2018 1124   LDLCALC 69 04/15/2020 0842   LDLCALC 110 (H) 09/28/2017 1557    Physical Exam:    VS:  BP 138/82 (BP Location: Left Arm, Patient Position: Sitting, Cuff Size: Large)   Pulse (!) 59   Ht 5\' 6"  (1.676 m)   Wt 231 lb (104.8 kg)   SpO2 98%   BMI 37.28 kg/m     Wt Readings from Last 3 Encounters:  07/20/20 231 lb (104.8 kg)  05/26/20 229 lb (103.9 kg)  04/20/20 225 lb (102.1 kg)     GEN:  Well nourished, well developed in no acute distress HEENT: Normal NECK: No JVD; No carotid bruits LYMPHATICS: No lymphadenopathy CARDIAC: RRR, no murmurs, rubs, gallops RESPIRATORY:  Clear to auscultation without rales, wheezing or rhonchi  ABDOMEN: Soft, non-tender, non-distended MUSCULOSKELETAL:  No edema; No deformity  SKIN: Warm and dry NEUROLOGIC:  Alert and oriented x 3 PSYCHIATRIC:  Normal affect   ASSESSMENT:    1. Essential hypertension   2. Pure hypercholesterolemia   3. BMI 37.0-37.9, adult    PLAN:    In order of problems listed above:  1. History of hypertension, blood pressure controlled.  Advised on low-salt, low calorie diet. 2. History of hyperlipidemia, continue statin. 3. Patient is obese, low-calorie diet recommended, weight loss advised.  Improvement in weight will help with patient's blood pressure and  cholesterol issues.  Follow-up in 12 months.  Total encounter time 35 minutes  Greater than 50% was spent in counseling and coordination of care with the patient   This note was generated in part or whole with voice recognition software. Voice recognition is usually quite accurate but there are transcription errors that can and very often do occur. I apologize for any typographical errors that were not detected and corrected.  Medication Adjustments/Labs and Tests Ordered: Current medicines are reviewed at length with the patient today.  Concerns regarding medicines are outlined above.  Orders Placed This Encounter  Procedures  . EKG 12-Lead   No orders of the defined types were placed in this encounter.  Patient Instructions  Medication Instructions:  Your physician recommends that you continue on your current medications as directed. Please refer to the Current Medication list given to you today.  *If you need a refill on your cardiac medications before your next appointment, please call your pharmacy*   Lab Work: None ordered If you have labs (blood work) drawn today and your tests are completely normal, you will receive your results only by: Marland Kitchen MyChart Message (if you have MyChart) OR . A paper copy in the mail If you have any lab test that is abnormal or we need to change your treatment, we will call you to review the results.   Testing/Procedures: None ordered   Follow-Up: At [facility], you and your health needs are our priority.  As part of our continuing mission to provide you with exceptional heart care, we have created designated Provider Care Teams.  These Care Teams include your primary Cardiologist (physician) and Advanced Practice Providers (APPs -  Physician Assistants and Nurse Practitioners) who all work together to provide you with the care you need, when you need it.  We recommend signing up for the patient portal called "MyChart".  Sign up information  is provided on this After Visit Summary.  MyChart is used to connect with patients for Virtual Visits (Telemedicine).  Patients are able to view lab/test results, encounter notes, upcoming appointments, etc.  Non-urgent messages can be sent to your provider as well.   To learn more about what you can do with MyChart, go to [email or web address].    Your next appointment:   12 month(s)  The format for your next appointment:   In Person  Provider:   Kate Sable, MD   Other Instructions      Signed, Kate Sable, MD  07/20/2020 2:54 PM    [place]

## 2020-07-20 NOTE — Patient Instructions (Signed)
Medication Instructions:  Your physician recommends that you continue on your current medications as directed. Please refer to the Current Medication list given to you today.  *If you need a refill on your cardiac medications before your next appointment, please call your pharmacy*   Lab Work: None ordered If you have labs (blood work) drawn today and your tests are completely normal, you will receive your results only by: Marland Kitchen MyChart Message (if you have MyChart) OR . A paper copy in the mail If you have any lab test that is abnormal or we need to change your treatment, we will call you to review the results.   Testing/Procedures: None ordered   Follow-Up: At [facility], you and your health needs are our priority.  As part of our continuing mission to provide you with exceptional heart care, we have created designated Provider Care Teams.  These Care Teams include your primary Cardiologist (physician) and Advanced Practice Providers (APPs -  Physician Assistants and Nurse Practitioners) who all work together to provide you with the care you need, when you need it.  We recommend signing up for the patient portal called "MyChart".  Sign up information is provided on this After Visit Summary.  MyChart is used to connect with patients for Virtual Visits (Telemedicine).  Patients are able to view lab/test results, encounter notes, upcoming appointments, etc.  Non-urgent messages can be sent to your provider as well.   To learn more about what you can do with MyChart, go to [email or web address].    Your next appointment:   12 month(s)  The format for your next appointment:   In Person  Provider:   Kate Sable, MD   Other Instructions

## 2020-08-21 ENCOUNTER — Other Ambulatory Visit: Payer: Self-pay

## 2020-08-21 ENCOUNTER — Other Ambulatory Visit (INDEPENDENT_AMBULATORY_CARE_PROVIDER_SITE_OTHER): Payer: Medicare Other

## 2020-08-21 DIAGNOSIS — E785 Hyperlipidemia, unspecified: Secondary | ICD-10-CM | POA: Diagnosis not present

## 2020-08-21 DIAGNOSIS — I10 Essential (primary) hypertension: Secondary | ICD-10-CM

## 2020-08-21 LAB — HEPATIC FUNCTION PANEL
ALT: 15 U/L (ref 0–35)
AST: 15 U/L (ref 0–37)
Albumin: 4.2 g/dL (ref 3.5–5.2)
Alkaline Phosphatase: 101 U/L (ref 39–117)
Bilirubin, Direct: 0.1 mg/dL (ref 0.0–0.3)
Total Bilirubin: 0.7 mg/dL (ref 0.2–1.2)
Total Protein: 6.8 g/dL (ref 6.0–8.3)

## 2020-08-21 LAB — LIPID PANEL
Cholesterol: 194 mg/dL (ref 0–200)
HDL: 83.1 mg/dL (ref >39.00)
LDL Cholesterol: 91 mg/dL (ref 0–99)
NonHDL: 110.83
Total CHOL/HDL Ratio: 2
Triglycerides: 97 mg/dL (ref 0.0–149.0)
VLDL: 19.4 mg/dL (ref 0.0–40.0)

## 2020-08-21 LAB — BASIC METABOLIC PANEL
BUN: 16 mg/dL (ref 6–23)
CO2: 27 mEq/L (ref 19–32)
Calcium: 9.4 mg/dL (ref 8.4–10.5)
Chloride: 104 mEq/L (ref 96–112)
Creatinine, Ser: 0.67 mg/dL (ref 0.40–1.20)
GFR: 86.77 mL/min (ref >60.00)
Glucose, Bld: 108 mg/dL — ABNORMAL HIGH (ref 70–99)
Potassium: 4.2 mEq/L (ref 3.5–5.1)
Sodium: 139 mEq/L (ref 135–145)

## 2020-08-25 ENCOUNTER — Ambulatory Visit (INDEPENDENT_AMBULATORY_CARE_PROVIDER_SITE_OTHER): Payer: Medicare Other | Admitting: Internal Medicine

## 2020-08-25 ENCOUNTER — Other Ambulatory Visit: Payer: Self-pay

## 2020-08-25 ENCOUNTER — Encounter: Payer: Self-pay | Admitting: Internal Medicine

## 2020-08-25 VITALS — BP 118/74 | HR 70 | Temp 98.7°F | Ht 66.0 in | Wt 221.2 lb

## 2020-08-25 DIAGNOSIS — F320 Major depressive disorder, single episode, mild: Secondary | ICD-10-CM | POA: Diagnosis not present

## 2020-08-25 DIAGNOSIS — R739 Hyperglycemia, unspecified: Secondary | ICD-10-CM | POA: Diagnosis not present

## 2020-08-25 DIAGNOSIS — E785 Hyperlipidemia, unspecified: Secondary | ICD-10-CM | POA: Diagnosis not present

## 2020-08-25 DIAGNOSIS — Z1231 Encounter for screening mammogram for malignant neoplasm of breast: Secondary | ICD-10-CM

## 2020-08-25 DIAGNOSIS — I10 Essential (primary) hypertension: Secondary | ICD-10-CM | POA: Diagnosis not present

## 2020-08-25 DIAGNOSIS — G4730 Sleep apnea, unspecified: Secondary | ICD-10-CM

## 2020-08-25 DIAGNOSIS — F32A Depression, unspecified: Secondary | ICD-10-CM

## 2020-08-25 MED ORDER — ROSUVASTATIN CALCIUM 40 MG PO TABS: 40.0000 mg | ORAL_TABLET | Freq: Every day | ORAL | 1 refills | Status: DC

## 2020-08-25 NOTE — Patient Instructions (Signed)
Stop lipitor.    Start crestor - one per day.

## 2020-08-25 NOTE — Progress Notes (Signed)
Patient ID: Sherry Davila, female   DOB: 04/10/1946, 75 y.o.   MRN: [id number]   Subjective:    Patient ID: Sherry Davila, female    DOB: 1945-06-01, 75 y.o.   MRN: [id number]  HPI This visit occurred during the SARS-CoV-2 public health emergency.  Safety protocols were in place, including screening questions prior to the visit, additional usage of staff PPE, and extensive cleaning of exam room while observing appropriate contact time as indicated for disinfecting solutions.  Patient here for a scheduled follow up.  Here to follow up regarding her cholesterol and blood pressure.  She reports she is doing well.  Feels good.  Back at the gym.  No chest pain or sob with increased activity or exertion. No acid reflux or abdominal pain reported.  Bowels moving.  Taking medication.  Restless legs - stable.  Discussed labs.     Past Medical History:  Diagnosis Date  . Anxiety   . Depression   . Hyperlipidemia   . Hypertension   . Obesity   . Sleep apnea    has a cpap, doesn't wear   . Varicose vein    Past Surgical History:  Procedure Laterality Date  . COLONOSCOPY WITH PROPOFOL N/A 09/08/2016   Procedure: COLONOSCOPY WITH PROPOFOL;  Surgeon: Jonathon Bellows, MD;  Location: [facility] ENDOSCOPY;  Service: Endoscopy;  Laterality: N/A;  . COLONOSCOPY WITH PROPOFOL N/A 01/07/2020   Procedure: COLONOSCOPY WITH PROPOFOL;  Surgeon: Jonathon Bellows, MD;  Location: [facility] ENDOSCOPY;  Service: Gastroenterology;  Laterality: N/A;  . FRACTURE SURGERY     foot, screws placed  . JOINT REPLACEMENT Right 2009   knee   Family History  Problem Relation Age of Onset  . Cancer Mother   . Heart failure Mother   . Heart disease Mother   . Breast cancer Mother 70  . Heart attack Mother   . AAA (abdominal aortic aneurysm) Father   . Hypertension Father   . Heart disease Father   . Hypertension Sister    Social History   Socioeconomic History  . Marital status: Married    Spouse name: Not on file  . Number  of children: Not on file  . Years of education: Not on file  . Highest education level: Not on file  Occupational History  . Not on file  Tobacco Use  . Smoking status: Former Smoker    Types: Cigarettes    Quit date: 01/19/1988    Years since quitting: 32.6  . Smokeless tobacco: Never Used  Vaping Use  . Vaping Use: Never used  Substance and Sexual Activity  . Alcohol use: Yes    Alcohol/week: 3.0 standard drinks    Types: 3 Shots of liquor per week    Comment: 3 drinks a week socially   . Drug use: No  . Sexual activity: Not on file  Other Topics Concern  . Not on file  Social History Narrative  . Not on file   Social Determinants of Health   Financial Resource Strain: Not on file  Food Insecurity: Not on file  Transportation Needs: Not on file  Physical Activity: Not on file  Stress: Not on file  Social Connections: Not on file    Outpatient Encounter Medications as of 08/25/2020  Medication Sig  . amLODipine (NORVASC) 5 MG tablet TAKE ONE TABLET BY MOUTH DAILY  . atenolol (TENORMIN) 50 MG tablet TAKE ONE TABLET BY MOUTH DAILY  . FLUoxetine (PROZAC) 40 MG capsule TAKE ONE CAPSULE  BY MOUTH DAILY  . losartan-hydrochlorothiazide (HYZAAR) 100-12.5 MG tablet TAKE ONE TABLET BY MOUTH DAILY  . rosuvastatin (CRESTOR) 40 MG tablet Take 1 tablet (40 mg total) by mouth daily.  . [DISCONTINUED] atorvastatin (LIPITOR) 40 MG tablet TAKE ONE TABLET BY MOUTH DAILY AT 6 P.M. - NEED APPOINTMENT WITH PCP FOR FURTHER REFILLS   No facility-administered encounter medications on file as of 08/25/2020.    Review of Systems  Constitutional: Negative for appetite change and unexpected weight change.  HENT: Negative for congestion and sinus pressure.   Respiratory: Negative for cough, chest tightness and shortness of breath.   Cardiovascular: Negative for chest pain, palpitations and leg swelling.  Gastrointestinal: Negative for diarrhea, nausea and vomiting.  Genitourinary: Negative for  difficulty urinating and dysuria.  Musculoskeletal: Negative for joint swelling and myalgias.  Skin: Negative for color change and rash.  Neurological: Negative for dizziness, light-headedness and headaches.  Psychiatric/Behavioral: Negative for agitation and dysphoric mood.       Objective:    Physical Exam Vitals reviewed.  Constitutional:      General: She is not in acute distress.    Appearance: Normal appearance.  HENT:     Head: Normocephalic and atraumatic.     Right Ear: External ear normal.     Left Ear: External ear normal.  Eyes:     General: No scleral icterus.       Right eye: No discharge.        Left eye: No discharge.     Conjunctiva/sclera: Conjunctivae normal.  Neck:     Thyroid: No thyromegaly.  Cardiovascular:     Rate and Rhythm: Normal rate and regular rhythm.  Pulmonary:     Effort: No respiratory distress.     Breath sounds: Normal breath sounds. No wheezing.  Abdominal:     General: Bowel sounds are normal.     Palpations: Abdomen is soft.     Tenderness: There is no abdominal tenderness.  Musculoskeletal:        General: No swelling or tenderness.     Cervical back: Neck supple. No tenderness.  Lymphadenopathy:     Cervical: No cervical adenopathy.  Skin:    Findings: No erythema or rash.  Neurological:     Mental Status: She is alert.  Psychiatric:        Mood and Affect: Mood normal.        Behavior: Behavior normal.     BP 118/74   Pulse 70   Temp 98.7 F (37.1 C)   Ht 5' 6" (1.676 m)   Wt 221 lb 4 oz (100.4 kg)   SpO2 95%   BMI 35.71 kg/m  Wt Readings from Last 3 Encounters:  08/25/20 221 lb 4 oz (100.4 kg)  07/20/20 231 lb (104.8 kg)  05/26/20 229 lb (103.9 kg)     Lab Results  Component Value Date   WBC 5.8 04/15/2020   HGB 13.3 04/15/2020   HCT 39.8 04/15/2020   PLT 230.0 04/15/2020   GLUCOSE 108 (H) 08/21/2020   CHOL 194 08/21/2020   TRIG 97.0 08/21/2020   HDL 83.10 08/21/2020   LDLCALC 91 08/21/2020   ALT  15 08/21/2020   AST 15 08/21/2020   NA 139 08/21/2020   K 4.2 08/21/2020   CL 104 08/21/2020   CREATININE 0.67 08/21/2020   BUN 16 08/21/2020   CO2 27 08/21/2020   TSH 2.78 04/15/2020       Assessment & Plan:   Problem List  Items Addressed This Visit    Hyperglycemia    Low carb diet and exercise. Follow met b and a1c.       Relevant Orders   Hemoglobin U5J   Basic metabolic panel   Hyperlipidemia    Was on lipitor.  Change to crestor for better LDL.  Low cholesterol diet and exercise.  Follow lipid panel and liver function tests.        Relevant Medications   rosuvastatin (CRESTOR) 40 MG tablet   Other Relevant Orders   Hepatic function panel   Hypertension    Continue losartan/hctz, amlodipine and atenolol.  Blood pressure 128/72 on my check.  Follow pressures.  Follow metabolic panel.       Relevant Medications   rosuvastatin (CRESTOR) 40 MG tablet   Mild depression (HCC)    Doing well on prozac.  Follow.       Sleep apnea    Continue cpap.        Other Visit Diagnoses    Encounter for screening mammogram for malignant neoplasm of breast    -  Primary   Relevant Orders   MM 3D SCREEN BREAST BILATERAL       Einar Pheasant, MD

## 2020-08-30 ENCOUNTER — Encounter: Payer: Self-pay | Admitting: Internal Medicine

## 2020-08-30 DIAGNOSIS — R739 Hyperglycemia, unspecified: Secondary | ICD-10-CM | POA: Insufficient documentation

## 2020-08-30 NOTE — Assessment & Plan Note (Signed)
Continue losartan/hctz, amlodipine and atenolol.  Blood pressure 128/72 on my check.  Follow pressures.  Follow metabolic panel.

## 2020-08-30 NOTE — Assessment & Plan Note (Signed)
Doing well on prozac.  Follow.

## 2020-08-30 NOTE — Assessment & Plan Note (Signed)
Low carb diet and exercise. Follow met b and a1c.  

## 2020-08-30 NOTE — Assessment & Plan Note (Signed)
Was on lipitor.  Change to crestor for better LDL.  Low cholesterol diet and exercise.  Follow lipid panel and liver function tests.

## 2020-08-30 NOTE — Assessment & Plan Note (Signed)
Continue cpap.  

## 2020-09-30 DIAGNOSIS — Z23 Encounter for immunization: Secondary | ICD-10-CM | POA: Diagnosis not present

## 2020-10-06 ENCOUNTER — Other Ambulatory Visit: Payer: Medicare Other

## 2020-10-07 ENCOUNTER — Other Ambulatory Visit: Payer: Self-pay

## 2020-10-07 ENCOUNTER — Other Ambulatory Visit (INDEPENDENT_AMBULATORY_CARE_PROVIDER_SITE_OTHER): Payer: Medicare Other

## 2020-10-07 DIAGNOSIS — E785 Hyperlipidemia, unspecified: Secondary | ICD-10-CM

## 2020-10-07 DIAGNOSIS — R739 Hyperglycemia, unspecified: Secondary | ICD-10-CM

## 2020-10-07 LAB — BASIC METABOLIC PANEL
BUN: 17 mg/dL (ref 6–23)
CO2: 25 mEq/L (ref 19–32)
Calcium: 8.9 mg/dL (ref 8.4–10.5)
Chloride: 106 mEq/L (ref 96–112)
Creatinine, Ser: 0.6 mg/dL (ref 0.40–1.20)
GFR: 89.03 mL/min (ref >60.00)
Glucose, Bld: 96 mg/dL (ref 70–99)
Potassium: 4.1 mEq/L (ref 3.5–5.1)
Sodium: 141 mEq/L (ref 135–145)

## 2020-10-07 LAB — HEPATIC FUNCTION PANEL
ALT: 14 U/L (ref 0–35)
AST: 12 U/L (ref 0–37)
Albumin: 4 g/dL (ref 3.5–5.2)
Alkaline Phosphatase: 94 U/L (ref 39–117)
Bilirubin, Direct: 0.1 mg/dL (ref 0.0–0.3)
Total Bilirubin: 0.6 mg/dL (ref 0.2–1.2)
Total Protein: 6.1 g/dL (ref 6.0–8.3)

## 2020-10-07 LAB — HEMOGLOBIN A1C: Hgb A1c MFr Bld: 5.6 % (ref 4.6–6.5)

## 2020-10-09 ENCOUNTER — Other Ambulatory Visit: Payer: Self-pay

## 2020-10-09 ENCOUNTER — Ambulatory Visit (INDEPENDENT_AMBULATORY_CARE_PROVIDER_SITE_OTHER): Payer: Medicare Other

## 2020-10-09 VITALS — BP 122/74 | HR 57 | Temp 97.1°F | Resp 14 | Ht 66.0 in | Wt 221.8 lb

## 2020-10-09 DIAGNOSIS — Z0000 Encounter for general adult medical examination without abnormal findings: Secondary | ICD-10-CM

## 2020-10-09 NOTE — Patient Instructions (Addendum)
Sherry Davila , Thank you for taking time to come for your Medicare Wellness Visit. I appreciate your ongoing commitment to your health goals. Please review the following plan we discussed and let me know if I can assist you in the future.   These are the goals we discussed: Goals      Patient Stated   .  I'd like to start going to the gym again (pt-stated)      Exercise, stay active       This is a list of the screening recommended for you and due dates:  Health Maintenance  Topic Date Due  . COVID-19 Vaccine (4 - Booster for Pfizer series) 12/09/2020*  . Flu Shot  12/21/2020  . Mammogram  09/29/2021  . Colon Cancer Screening  01/07/2023  . Tetanus Vaccine  12/09/2026  . DEXA scan (bone density measurement)  Completed  . Hepatitis C Screening: USPSTF Recommendation to screen - Ages 42-79 yo.  Completed  . Pneumonia vaccines  Completed  . HPV Vaccine  Aged Out  *Topic was postponed. The date shown is not the original due date.   Conditions/risks identified: none new  Follow up in one year for your annual wellness visit   Preventive Care 65 Years and Older, Female Preventive care refers to lifestyle choices and visits with your health care provider that can promote health and wellness. What does preventive care include?  A yearly physical exam. This is also called an annual well check.  Dental exams once or twice a year.  Routine eye exams. Ask your health care provider how often you should have your eyes checked.  Personal lifestyle choices, including:  Daily care of your teeth and gums.  Regular physical activity.  Eating a healthy diet.  Avoiding tobacco and drug use.  Limiting alcohol use.  Practicing safe sex.  Taking low-dose aspirin every day.  Taking vitamin and mineral supplements as recommended by your health care provider. What happens during an annual well check? The services and screenings done by your health care provider during your annual well  check will depend on your age, overall health, lifestyle risk factors, and family history of disease. Counseling  Your health care provider may ask you questions about your:  Alcohol use.  Tobacco use.  Drug use.  Emotional well-being.  Home and relationship well-being.  Sexual activity.  Eating habits.  History of falls.  Memory and ability to understand (cognition).  Work and work Statistician.  Reproductive health. Screening  You may have the following tests or measurements:  Height, weight, and BMI.  Blood pressure.  Lipid and cholesterol levels. These may be checked every 5 years, or more frequently if you are over 96 years old.  Skin check.  Lung cancer screening. You may have this screening every year starting at age 18 if you have a 30-pack-year history of smoking and currently smoke or have quit within the past 15 years.  Fecal occult blood test (FOBT) of the stool. You may have this test every year starting at age 56.  Flexible sigmoidoscopy or colonoscopy. You may have a sigmoidoscopy every 5 years or a colonoscopy every 10 years starting at age 41.  Hepatitis C blood test.  Hepatitis B blood test.  Sexually transmitted disease (STD) testing.  Diabetes screening. This is done by checking your blood sugar (glucose) after you have not eaten for a while (fasting). You may have this done every 1-3 years.  Bone density scan. This is done to  screen for osteoporosis. You may have this done starting at age 55.  Mammogram. This may be done every 1-2 years. Talk to your health care provider about how often you should have regular mammograms. Talk with your health care provider about your test results, treatment options, and if necessary, the need for more tests. Vaccines  Your health care provider may recommend certain vaccines, such as:  Influenza vaccine. This is recommended every year.  Tetanus, diphtheria, and acellular pertussis (Tdap, Td) vaccine. You  may need a Td booster every 10 years.  Zoster vaccine. You may need this after age 64.  Pneumococcal 13-valent conjugate (PCV13) vaccine. One dose is recommended after age 64.  Pneumococcal polysaccharide (PPSV23) vaccine. One dose is recommended after age 75. Talk to your health care provider about which screenings and vaccines you need and how often you need them. This information is not intended to replace advice given to you by your health care provider. Make sure you discuss any questions you have with your health care provider. Document Released: 06/05/2015 Document Revised: 01/27/2016 Document Reviewed: 03/10/2015 Elsevier Interactive Patient Education  2017 [place] Prevention in the Home Falls can cause injuries. They can happen to people of all ages. There are many things you can do to make your home safe and to help prevent falls. What can I do on the outside of my home?  Regularly fix the edges of walkways and driveways and fix any cracks.  Remove anything that might make you trip as you walk through a door, such as a raised step or threshold.  Trim any bushes or trees on the path to your home.  Use bright outdoor lighting.  Clear any walking paths of anything that might make someone trip, such as rocks or tools.  Regularly check to see if handrails are loose or broken. Make sure that both sides of any steps have handrails.  Any raised decks and porches should have guardrails on the edges.  Have any leaves, snow, or ice cleared regularly.  Use sand or salt on walking paths during winter.  Clean up any spills in your garage right away. This includes oil or grease spills. What can I do in the bathroom?  Use night lights.  Install grab bars by the toilet and in the tub and shower. Do not use towel bars as grab bars.  Use non-skid mats or decals in the tub or shower.  If you need to sit down in the shower, use a plastic, non-slip stool.  Keep the floor  dry. Clean up any water that spills on the floor as soon as it happens.  Remove soap buildup in the tub or shower regularly.  Attach bath mats securely with double-sided non-slip rug tape.  Do not have throw rugs and other things on the floor that can make you trip. What can I do in the bedroom?  Use night lights.  Make sure that you have a light by your bed that is easy to reach.  Do not use any sheets or blankets that are too big for your bed. They should not hang down onto the floor.  Have a firm chair that has side arms. You can use this for support while you get dressed.  Do not have throw rugs and other things on the floor that can make you trip. What can I do in the kitchen?  Clean up any spills right away.  Avoid walking on wet floors.  Keep items that  you use a lot in easy-to-reach places.  If you need to reach something above you, use a strong step stool that has a grab bar.  Keep electrical cords out of the way.  Do not use floor polish or wax that makes floors slippery. If you must use wax, use non-skid floor wax.  Do not have throw rugs and other things on the floor that can make you trip. What can I do with my stairs?  Do not leave any items on the stairs.  Make sure that there are handrails on both sides of the stairs and use them. Fix handrails that are broken or loose. Make sure that handrails are as long as the stairways.  Check any carpeting to make sure that it is firmly attached to the stairs. Fix any carpet that is loose or worn.  Avoid having throw rugs at the top or bottom of the stairs. If you do have throw rugs, attach them to the floor with carpet tape.  Make sure that you have a light switch at the top of the stairs and the bottom of the stairs. If you do not have them, ask someone to add them for you. What else can I do to help prevent falls?  Wear shoes that:  Do not have high heels.  Have rubber bottoms.  Are comfortable and fit you  well.  Are closed at the toe. Do not wear sandals.  If you use a stepladder:  Make sure that it is fully opened. Do not climb a closed stepladder.  Make sure that both sides of the stepladder are locked into place.  Ask someone to hold it for you, if possible.  Clearly mark and make sure that you can see:  Any grab bars or handrails.  First and last steps.  Where the edge of each step is.  Use tools that help you move around (mobility aids) if they are needed. These include:  Canes.  Walkers.  Scooters.  Crutches.  Turn on the lights when you go into a dark area. Replace any light bulbs as soon as they burn out.  Set up your furniture so you have a clear path. Avoid moving your furniture around.  If any of your floors are uneven, fix them.  If there are any pets around you, be aware of where they are.  Review your medicines with your doctor. Some medicines can make you feel dizzy. This can increase your chance of falling. Ask your doctor what other things that you can do to help prevent falls. This information is not intended to replace advice given to you by your health care provider. Make sure you discuss any questions you have with your health care provider. Document Released: 03/05/2009 Document Revised: 10/15/2015 Document Reviewed: 06/13/2014 Elsevier Interactive Patient Education  2017 [employer].

## 2020-10-09 NOTE — Progress Notes (Signed)
Subjective:   Jerzey Komperda is a 75 y.o. female who presents for Medicare Annual (Subsequent) preventive examination.  Review of Systems    No ROS.  Medicare Wellness   Cardiac Risk Factors include: advanced age (>25men, >10 women)     Objective:    Today's Vitals   10/09/20 1320  BP: 122/74  Pulse: (!) 57  Resp: 14  Temp: (!) 97.1 F (36.2 C)  SpO2: 95%  Weight: 221 lb 12.8 oz (100.6 kg)  Height: 5\' 6"  (1.676 m)   Body mass index is 35.8 kg/m.  Advanced Directives 10/09/2020 01/07/2020 10/09/2019 08/17/2017 09/08/2016 02/05/2015 01/19/2015  Does Patient Have a Medical Advance Directive? No Yes Yes No Yes No;Yes Yes  Type of Advance Directive - - [place];Living will - Living will [place];Living will [place];Living will  Does patient want to make changes to medical advance directive? - - No - Patient declined - - - -  Copy of [place] in Chart? - - No - copy requested - - - -  Would patient like information on creating a medical advance directive? No - Patient declined - - Yes (MAU/Ambulatory/Procedural Areas - Information given) - Yes - Educational materials given -    Current Medications (verified) Outpatient Encounter Medications as of 10/09/2020  Medication Sig  . amLODipine (NORVASC) 5 MG tablet TAKE ONE TABLET BY MOUTH DAILY  . atenolol (TENORMIN) 50 MG tablet TAKE ONE TABLET BY MOUTH DAILY  . FLUoxetine (PROZAC) 40 MG capsule TAKE ONE CAPSULE BY MOUTH DAILY  . losartan-hydrochlorothiazide (HYZAAR) 100-12.5 MG tablet TAKE ONE TABLET BY MOUTH DAILY  . rosuvastatin (CRESTOR) 40 MG tablet Take 1 tablet (40 mg total) by mouth daily.   No facility-administered encounter medications on file as of 10/09/2020.    Allergies (verified) Patient has no known allergies.   History: Past Medical History:  Diagnosis Date  . Anxiety   . Depression   . Hyperlipidemia   . Hypertension   .  Obesity   . Sleep apnea    has a cpap, doesn't wear   . Varicose vein    Past Surgical History:  Procedure Laterality Date  . COLONOSCOPY WITH PROPOFOL N/A 09/08/2016   Procedure: COLONOSCOPY WITH PROPOFOL;  Surgeon: Jonathon Bellows, MD;  Location: [facility] ENDOSCOPY;  Service: Endoscopy;  Laterality: N/A;  . COLONOSCOPY WITH PROPOFOL N/A 01/07/2020   Procedure: COLONOSCOPY WITH PROPOFOL;  Surgeon: Jonathon Bellows, MD;  Location: [facility] ENDOSCOPY;  Service: Gastroenterology;  Laterality: N/A;  . FRACTURE SURGERY     foot, screws placed  . JOINT REPLACEMENT Right 2009   knee   Family History  Problem Relation Age of Onset  . Cancer Mother   . Heart failure Mother   . Heart disease Mother   . Breast cancer Mother 52  . Heart attack Mother   . AAA (abdominal aortic aneurysm) Father   . Hypertension Father   . Heart disease Father   . Hypertension Sister    Social History   Socioeconomic History  . Marital status: Married    Spouse name: Not on file  . Number of children: Not on file  . Years of education: Not on file  . Highest education level: Not on file  Occupational History  . Not on file  Tobacco Use  . Smoking status: Former Smoker    Types: Cigarettes    Quit date: 01/19/1988    Years since quitting: 32.7  .  Smokeless tobacco: Never Used  Vaping Use  . Vaping Use: Never used  Substance and Sexual Activity  . Alcohol use: Yes    Alcohol/week: 3.0 standard drinks    Types: 3 Shots of liquor per week    Comment: 3 drinks a week socially   . Drug use: No  . Sexual activity: Not on file  Other Topics Concern  . Not on file  Social History Narrative  . Not on file   Social Determinants of Health   Financial Resource Strain: Low Risk   . Difficulty of Paying Living Expenses: Not hard at all  Food Insecurity: No Food Insecurity  . Worried About Charity fundraiser in the Last Year: Never true  . Ran Out of Food in the Last Year: Never true  Transportation Needs: No  Transportation Needs  . Lack of Transportation (Medical): No  . Lack of Transportation (Non-Medical): No  Physical Activity: Insufficiently Active  . Days of Exercise per Week: 3 days  . Minutes of Exercise per Session: 30 min  Stress: No Stress Concern Present  . Feeling of Stress : Not at all  Social Connections: Moderately Integrated  . Frequency of Communication with Friends and Family: Never  . Frequency of Social Gatherings with Friends and Family: More than three times a week  . Attends Religious Services: More than 4 times per year  . Active Member of Clubs or Organizations: No  . Attends Archivist Meetings: Never  . Marital Status: Married    Tobacco Counseling Counseling given: Not Answered   Clinical Intake:  Pre-visit preparation completed: Yes        Diabetes: No  How often do you need to have someone help you when you read instructions, pamphlets, or other written materials from your doctor or pharmacy?: 1 - Never   Interpreter Needed?: No      Activities of Daily Living In your present state of health, do you have any difficulty performing the following activities: 10/09/2020  Hearing? N  Vision? N  Difficulty concentrating or making decisions? N  Walking or climbing stairs? Y  Dressing or bathing? N  Doing errands, shopping? N  Preparing Food and eating ? N  Using the Toilet? N  In the past six months, have you accidently leaked urine? N  Do you have problems with loss of bowel control? N  Managing your Medications? N  Managing your Finances? N  Housekeeping or managing your Housekeeping? N  Some recent data might be hidden    Patient Care Team: Einar Pheasant, MD as PCP - General (Internal Medicine) Kate Sable, MD as PCP - Cardiology (Cardiology)  Indicate any recent Medical Services you may have received from other than [facility] providers in the past year (date may be approximate).     Assessment:   This is a routine  wellness examination for [place].  Hearing/Vision screen  Hearing Screening   125Hz  250Hz  500Hz  1000Hz  2000Hz  3000Hz  4000Hz  6000Hz  8000Hz   Right ear:           Left ear:           Comments: Patient is able to hear conversational tones without difficulty.  No issues reported.  Vision Screening Comments: Wears corrective lenses  Visual acuity not assessed, virtual visit. They have seen their ophthalmologist  Dietary issues and exercise activities discussed: Current Exercise Habits: Home exercise routine  Healthy diet Good water itake  Goals Addressed  This Visit's Progress     Patient Stated   .  I'd like to start going to the gym again (pt-stated)        Exercise, stay active      Depression Screen PHQ 2/9 Scores 08/25/2020 05/26/2020 10/11/2019 10/09/2019 09/28/2017 08/17/2017 01/19/2015  PHQ - 2 Score 2 0 0 0 0 2 4  PHQ- 9 Score 5 0 0 - - 5 -    Fall Risk Fall Risk  08/25/2020 10/09/2019 04/09/2018 09/28/2017 08/17/2017  Falls in the past year? 1 0 0 No No  Number falls in past yr: 0 - - - -  Injury with Fall? 0 - - - -  Follow up - Falls evaluation completed Falls evaluation completed - -    FALL RISK PREVENTION PERTAINING TO THE HOME: Handrails in use when climbing stairs? Yes Home free of loose throw rugs in walkways, pet beds, electrical cords, etc? Yes  Adequate lighting in your home to reduce risk of falls? Yes   ASSISTIVE DEVICES UTILIZED TO PREVENT FALLS: Life alert? No  Use of a cane, walker or w/c? No   TIMED UP AND GO: Was the test performed? Yes .  Length of time to ambulate 10 feet: 15 sec.   Gait steady and fast without use of assistive device  Cognitive Function:  Patient is alert and oriented x3.  Denies difficulty focusing, making decisions, memory loss.  Enjoys brain stimulating games  MMSE/6CIT deferred. Normal by direct communication/observation.    6CIT Screen 10/09/2019 08/17/2017  What Year? 0 points 0 points  What month? 0 points 0  points  What time? - 0 points  Count back from 20 - 0 points  Months in reverse 0 points 0 points  Repeat phrase - 0 points  Total Score - 0    Immunizations Immunization History  Administered Date(s) Administered  . Influenza-Unspecified 03/04/2014  . PFIZER(Purple Top)SARS-COV-2 Vaccination 07/22/2019, 08/12/2019, 02/20/2020  . Pneumococcal Conjugate-13 01/15/2014  . Pneumococcal-Unspecified 02/28/2013  . Td 01/15/2014  . Tdap 12/08/2016  . Zoster 01/02/2012   Health Maintenance There are no preventive care reminders to display for this patient. Health Maintenance  Topic Date Due  . COVID-19 Vaccine (4 - Booster for Pfizer series) 12/09/2020 (Originally 05/21/2020)  . INFLUENZA VACCINE  12/21/2020  . MAMMOGRAM  09/29/2021  . COLONOSCOPY (Pts 45-24yrs Insurance coverage will need to be confirmed)  01/07/2023  . TETANUS/TDAP  12/09/2026  . DEXA SCAN  Completed  . Hepatitis C Screening  Completed  . PNA vac Low Risk Adult  Completed  . HPV VACCINES  Aged Out   Colorectal cancer screening: Type of screening: Colonoscopy. Completed 01/07/20. Repeat every 3 years  Mammogram status: Completed 09/30/19. Repeat every year . Ordered on 08/25/20.   Lung Cancer Screening: (Low Dose CT Chest recommended if Age 38-80 years, 30 pack-year currently smoking OR have quit w/in 15years.) does not qualify.   Vision Screening: Recommended annual ophthalmology exams for early detection of glaucoma and other disorders of the eye. Is the patient up to date with their annual eye exam?  Yes   Dental Screening: Recommended annual dental exams for proper oral hygiene.   Community Resource Referral / Chronic Care Management: CRR required this visit?  No   CCM required this visit?  No      Plan:   Keep all routine maintenance appointments.   I have personally reviewed and noted the following in the patient's chart:   . Medical and social history .  Use of alcohol, tobacco or illicit drugs   . Current medications and supplements including opioid prescriptions. Patient is not currently taking opioid Rx . Functional ability and status . Nutritional status . Physical activity . Advanced directives . List of other physicians . Hospitalizations, surgeries, and ER visits in previous 12 months . Vitals . Screenings to include cognitive, depression, and falls . Referrals and appointments  In addition, I have reviewed and discussed with patient certain preventive protocols, quality metrics, and best practice recommendations. A written personalized care plan for preventive services as well as general preventive health recommendations were provided to patient.     Varney Biles, LPN   12/22/6551

## 2020-10-12 ENCOUNTER — Other Ambulatory Visit: Payer: Self-pay | Admitting: Internal Medicine

## 2020-10-12 DIAGNOSIS — F3342 Major depressive disorder, recurrent, in full remission: Secondary | ICD-10-CM

## 2020-10-12 DIAGNOSIS — I10 Essential (primary) hypertension: Secondary | ICD-10-CM

## 2020-11-13 ENCOUNTER — Encounter: Payer: Self-pay | Admitting: Internal Medicine

## 2020-12-03 ENCOUNTER — Other Ambulatory Visit: Payer: Self-pay | Admitting: Internal Medicine

## 2020-12-03 DIAGNOSIS — I10 Essential (primary) hypertension: Secondary | ICD-10-CM

## 2020-12-25 ENCOUNTER — Encounter: Payer: Self-pay | Admitting: Internal Medicine

## 2020-12-25 ENCOUNTER — Other Ambulatory Visit: Payer: Self-pay

## 2020-12-25 ENCOUNTER — Ambulatory Visit (INDEPENDENT_AMBULATORY_CARE_PROVIDER_SITE_OTHER): Payer: Medicare Other | Admitting: Internal Medicine

## 2020-12-25 DIAGNOSIS — F320 Major depressive disorder, single episode, mild: Secondary | ICD-10-CM

## 2020-12-25 DIAGNOSIS — G4730 Sleep apnea, unspecified: Secondary | ICD-10-CM

## 2020-12-25 DIAGNOSIS — R079 Chest pain, unspecified: Secondary | ICD-10-CM | POA: Diagnosis not present

## 2020-12-25 DIAGNOSIS — R739 Hyperglycemia, unspecified: Secondary | ICD-10-CM

## 2020-12-25 DIAGNOSIS — E785 Hyperlipidemia, unspecified: Secondary | ICD-10-CM | POA: Diagnosis not present

## 2020-12-25 DIAGNOSIS — I10 Essential (primary) hypertension: Secondary | ICD-10-CM

## 2020-12-25 DIAGNOSIS — F32A Depression, unspecified: Secondary | ICD-10-CM

## 2020-12-25 NOTE — Progress Notes (Signed)
Patient ID: Sherry Davila, female   DOB: [birth year], 75 y.o.   MRN: [id number]   Subjective:    Patient ID: Sherry Davila, female    DOB: 1946-02-10, 75 y.o.   MRN: [id number]  HPI This visit occurred during the SARS-CoV-2 public health emergency.  Safety protocols were in place, including screening questions prior to the visit, additional usage of staff PPE, and extensive cleaning of exam room while observing appropriate contact time as indicated for disinfecting solutions.   Patient here for a scheduled follow up. Here to follow-up regarding her hypertension and hypercholesterolemia.  She has recently been evaluated by cardiology.  She underwent an echocardiogram that revealed diastolic dysfunction.  Lexiscan Myoview revealed no evidence of ischemia.  She reports that her symptoms are stable.  Breathing stable.  No chest pain or increased shortness of breath reported.  No acid reflux.  No abdominal pain or bowel change reported.  Increased stress recently.  Her husband is in the hospital.  He is undergoing cardiac work-up.  Is having a heart catheterization today.  She feels she is handling this relatively well.  Does not feel she needs any further intervention at this time.  She is taking Prozac.   Past Medical History:  Diagnosis Date   Actinic keratosis 04/04/2018   L nose lat to supra tip   Anxiety    Basal cell carcinoma 04/04/2018   R prox nasal ala   Depression    Dysplastic nevus 05/07/2013   R abdomen - mod to severe, excision 05/29/2013   Hyperlipidemia    Hypertension    Obesity    Sleep apnea    has a cpap, doesn't wear    Varicose vein    Past Surgical History:  Procedure Laterality Date   COLONOSCOPY WITH PROPOFOL N/A 09/08/2016   Procedure: COLONOSCOPY WITH PROPOFOL;  Surgeon: Jonathon Bellows, MD;  Location: [facility] ENDOSCOPY;  Service: Endoscopy;  Laterality: N/A;   COLONOSCOPY WITH PROPOFOL N/A 01/07/2020   Procedure: COLONOSCOPY WITH PROPOFOL;  Surgeon: Jonathon Bellows,  MD;  Location: [facility] ENDOSCOPY;  Service: Gastroenterology;  Laterality: N/A;   FRACTURE SURGERY     foot, screws placed   JOINT REPLACEMENT Right 2009   knee   Family History  Problem Relation Age of Onset   Cancer Mother    Heart failure Mother    Heart disease Mother    Breast cancer Mother 83   Heart attack Mother    AAA (abdominal aortic aneurysm) Father    Hypertension Father    Heart disease Father    Hypertension Sister    Social History   Socioeconomic History   Marital status: Married    Spouse name: Not on file   Number of children: Not on file   Years of education: Not on file   Highest education level: Not on file  Occupational History   Not on file  Tobacco Use   Smoking status: Former    Types: Cigarettes    Quit date: 01/19/1988    Years since quitting: 32.9   Smokeless tobacco: Never  Vaping Use   Vaping Use: Never used  Substance and Sexual Activity   Alcohol use: Yes    Alcohol/week: 3.0 standard drinks    Types: 3 Shots of liquor per week    Comment: 3 drinks a week socially    Drug use: No   Sexual activity: Not on file  Other Topics Concern   Not on file  Social History Narrative  Not on file   Social Determinants of Health   Financial Resource Strain: Low Risk    Difficulty of Paying Living Expenses: Not hard at all  Food Insecurity: No Food Insecurity   Worried About Charity fundraiser in the Last Year: Never true   [place] in the Last Year: Never true  Transportation Needs: No Transportation Needs   Lack of Transportation (Medical): No   Lack of Transportation (Non-Medical): No  Physical Activity: Insufficiently Active   Days of Exercise per Week: 3 days   Minutes of Exercise per Session: 30 min  Stress: No Stress Concern Present   Feeling of Stress : Not at all  Social Connections: Moderately Integrated   Frequency of Communication with Friends and Family: Never   Frequency of Social Gatherings with Friends and Family:  More than three times a week   Attends Religious Services: More than 4 times per year   Active Member of [employer] or Organizations: No   Attends Archivist Meetings: Never   Marital Status: Married    Review of Systems  Constitutional:  Negative for appetite change and unexpected weight change.  HENT:  Negative for congestion and sinus pressure.   Respiratory:  Negative for cough and chest tightness.        Breathing stable.   Cardiovascular:  Negative for chest pain, palpitations and leg swelling.  Gastrointestinal:  Negative for abdominal pain, diarrhea, nausea and vomiting.  Genitourinary:  Negative for difficulty urinating and dysuria.  Musculoskeletal:  Negative for joint swelling and myalgias.  Skin:  Negative for color change and rash.  Neurological:  Negative for dizziness, light-headedness and headaches.  Psychiatric/Behavioral:  Negative for agitation and dysphoric mood.       Objective:    Physical Exam Vitals reviewed.  Constitutional:      General: She is not in acute distress.    Appearance: Normal appearance.  HENT:     Head: Normocephalic and atraumatic.     Right Ear: External ear normal.     Left Ear: External ear normal.  Eyes:     General: No scleral icterus.       Right eye: No discharge.        Left eye: No discharge.     Conjunctiva/sclera: Conjunctivae normal.  Neck:     Thyroid: No thyromegaly.  Cardiovascular:     Rate and Rhythm: Normal rate and regular rhythm.  Pulmonary:     Effort: No respiratory distress.     Breath sounds: Normal breath sounds. No wheezing.  Abdominal:     General: Bowel sounds are normal.     Palpations: Abdomen is soft.     Tenderness: There is no abdominal tenderness.  Musculoskeletal:        General: No swelling or tenderness.     Cervical back: Neck supple. No tenderness.  Lymphadenopathy:     Cervical: No cervical adenopathy.  Skin:    Findings: No erythema or rash.  Neurological:     Mental Status:  She is alert.  Psychiatric:        Mood and Affect: Mood normal.        Behavior: Behavior normal.    BP 132/80   Pulse (!) 55   Temp 97.6 F (36.4 C) (Oral)   Ht _0  (1.676 m)   Wt 226 lb 12.8 oz (102.9 kg)   SpO2 98%   BMI 36.61 kg/m  Wt Readings from Last 3 Encounters:  12/25/20 226 lb 12.8 oz (102.9 kg)  10/09/20 221 lb 12.8 oz (100.6 kg)  08/25/20 221 lb 4 oz (100.4 kg)    Outpatient Encounter Medications as of 12/25/2020  Medication Sig   amLODipine (NORVASC) 5 MG tablet TAKE ONE TABLET BY MOUTH DAILY   atenolol (TENORMIN) 50 MG tablet TAKE ONE TABLET BY MOUTH DAILY   FLUoxetine (PROZAC) 40 MG capsule TAKE ONE CAPSULE BY MOUTH DAILY   losartan-hydrochlorothiazide (HYZAAR) 100-12.5 MG tablet TAKE ONE TABLET BY MOUTH DAILY   [DISCONTINUED] rosuvastatin (CRESTOR) 40 MG tablet Take 1 tablet (40 mg total) by mouth daily.   atorvastatin (LIPITOR) 40 MG tablet Take 40 mg by mouth daily.   No facility-administered encounter medications on file as of 12/25/2020.     Lab Results  Component Value Date   WBC 5.8 04/15/2020   HGB 13.3 04/15/2020   HCT 39.8 04/15/2020   PLT 230.0 04/15/2020   GLUCOSE 96 10/07/2020   CHOL 194 08/21/2020   TRIG 97.0 08/21/2020   HDL 83.10 08/21/2020   LDLCALC 91 08/21/2020   ALT 14 10/07/2020   AST 12 10/07/2020   NA 141 10/07/2020   K 4.1 10/07/2020   CL 106 10/07/2020   CREATININE 0.60 10/07/2020   BUN 17 10/07/2020   CO2 25 10/07/2020   TSH 2.78 04/15/2020   HGBA1C 5.6 10/07/2020       Assessment & Plan:   Problem List Items Addressed This Visit     Chest pain    Has a remote history of chest pain.  She was recently evaluated by cardiology.  Work-up as outlined.  Lexiscan Myoview-negative for ischemia.  No chest pain recently.  Continue risk factor modification.  Follow-up.       Hyperglycemia    Low carb diet and exercise. Follow met b and a1c.        Hyperlipidemia    On Crestor.  Low-cholesterol diet and exercise.   Follow lipid panel liver function test. Lab Results  Component Value Date   CHOL 194 08/21/2020   HDL 83.10 08/21/2020   LDLCALC 91 08/21/2020   TRIG 97.0 08/21/2020   CHOLHDL 2 08/21/2020       Relevant Medications   atorvastatin (LIPITOR) 40 MG tablet   Other Relevant Orders   Lipid panel   TSH   Hepatic function panel   Hypertension    Continue losartan/hctz, amlodipine and atenolol.  Follow pressures.  Follow metabolic panel.        Relevant Medications   atorvastatin (LIPITOR) 40 MG tablet   Other Relevant Orders   Basic metabolic panel   Mild depression (HCC)    Increased stress as outlined.  Husband is in the hospital.  Undergoing heart catheterization today.  Discussed with her.  She is on Prozac.  Overall appears to be handling things relatively well.  Does not feel she needs any further intervention at this time.  Follow.       Sleep apnea    Continue CPAP.         Einar Pheasant, MD

## 2020-12-25 NOTE — Progress Notes (Signed)
Pre visit review using our clinic review tool, if applicable. No additional management support is needed unless otherwise documented below in the visit note. 

## 2020-12-27 ENCOUNTER — Encounter: Payer: Self-pay | Admitting: Internal Medicine

## 2020-12-27 NOTE — Assessment & Plan Note (Signed)
Increased stress as outlined.  Husband is in the hospital.  Undergoing heart catheterization today.  Discussed with her.  She is on Prozac.  Overall appears to be handling things relatively well.  Does not feel she needs any further intervention at this time.  Follow.

## 2020-12-27 NOTE — Assessment & Plan Note (Signed)
Continue CPAP.  

## 2020-12-27 NOTE — Assessment & Plan Note (Signed)
Low carb diet and exercise. Follow met b and a1c.

## 2020-12-27 NOTE — Assessment & Plan Note (Signed)
On Crestor.  Low-cholesterol diet and exercise.  Follow lipid panel liver function test. Lab Results  Component Value Date   CHOL 194 08/21/2020   HDL 83.10 08/21/2020   LDLCALC 91 08/21/2020   TRIG 97.0 08/21/2020   CHOLHDL 2 08/21/2020

## 2020-12-27 NOTE — Assessment & Plan Note (Signed)
Continue losartan/hctz, amlodipine and atenolol.  Follow pressures.  Follow metabolic panel.

## 2020-12-27 NOTE — Assessment & Plan Note (Signed)
Has a remote history of chest pain.  She was recently evaluated by cardiology.  Work-up as outlined.  Lexiscan Myoview-negative for ischemia.  No chest pain recently.  Continue risk factor modification.  Follow-up.

## 2021-01-14 ENCOUNTER — Other Ambulatory Visit: Payer: Self-pay

## 2021-01-14 ENCOUNTER — Ambulatory Visit (INDEPENDENT_AMBULATORY_CARE_PROVIDER_SITE_OTHER): Payer: Medicare Other | Admitting: Dermatology

## 2021-01-14 ENCOUNTER — Encounter: Payer: Self-pay | Admitting: Dermatology

## 2021-01-14 ENCOUNTER — Other Ambulatory Visit: Payer: Self-pay | Admitting: Internal Medicine

## 2021-01-14 DIAGNOSIS — L578 Other skin changes due to chronic exposure to nonionizing radiation: Secondary | ICD-10-CM | POA: Diagnosis not present

## 2021-01-14 DIAGNOSIS — Z85828 Personal history of other malignant neoplasm of skin: Secondary | ICD-10-CM | POA: Diagnosis not present

## 2021-01-14 DIAGNOSIS — D492 Neoplasm of unspecified behavior of bone, soft tissue, and skin: Secondary | ICD-10-CM

## 2021-01-14 DIAGNOSIS — D1800 Hemangioma unspecified site: Secondary | ICD-10-CM | POA: Diagnosis not present

## 2021-01-14 DIAGNOSIS — D485 Neoplasm of uncertain behavior of skin: Secondary | ICD-10-CM | POA: Diagnosis not present

## 2021-01-14 DIAGNOSIS — Z86018 Personal history of other benign neoplasm: Secondary | ICD-10-CM

## 2021-01-14 DIAGNOSIS — L820 Inflamed seborrheic keratosis: Secondary | ICD-10-CM | POA: Diagnosis not present

## 2021-01-14 DIAGNOSIS — D229 Melanocytic nevi, unspecified: Secondary | ICD-10-CM

## 2021-01-14 DIAGNOSIS — B078 Other viral warts: Secondary | ICD-10-CM | POA: Diagnosis not present

## 2021-01-14 DIAGNOSIS — L821 Other seborrheic keratosis: Secondary | ICD-10-CM | POA: Diagnosis not present

## 2021-01-14 DIAGNOSIS — Z1283 Encounter for screening for malignant neoplasm of skin: Secondary | ICD-10-CM | POA: Diagnosis not present

## 2021-01-14 DIAGNOSIS — L818 Other specified disorders of pigmentation: Secondary | ICD-10-CM | POA: Diagnosis not present

## 2021-01-14 DIAGNOSIS — D692 Other nonthrombocytopenic purpura: Secondary | ICD-10-CM

## 2021-01-14 DIAGNOSIS — L814 Other melanin hyperpigmentation: Secondary | ICD-10-CM

## 2021-01-14 DIAGNOSIS — L570 Actinic keratosis: Secondary | ICD-10-CM

## 2021-01-14 NOTE — Patient Instructions (Addendum)
Cryotherapy Aftercare  Wash gently with soap and water everyday.   Apply Vaseline and Band-Aid daily until healed.    Wound Care Instructions  Cleanse wound gently with soap and water once a day then pat dry with clean gauze. Apply a thing coat of Petrolatum (petroleum jelly, "Vaseline") over the wound (unless you have an allergy to this). We recommend that you use a new, sterile tube of Vaseline. Do not pick or remove scabs. Do not remove the yellow or white "healing tissue" from the base of the wound.  Cover the wound with fresh, clean, nonstick gauze and secure with paper tape. You may use Band-Aids in place of gauze and tape if the would is small enough, but would recommend trimming much of the tape off as there is often too much. Sometimes Band-Aids can irritate the skin.  You should call the office for your biopsy report after 1 week if you have not already been contacted.  If you experience any problems, such as abnormal amounts of bleeding, swelling, significant bruising, significant pain, or evidence of infection, please call the office immediately.  FOR ADULT SURGERY PATIENTS: If you need something for pain relief you may take 1 extra strength Tylenol (acetaminophen) AND 2 Ibuprofen (200mg each) together every 4 hours as needed for pain. (do not take these if you are allergic to them or if you have a reason you should not take them.) Typically, you may only need pain medication for 1 to 3 days.     If you have any questions or concerns for your doctor, please call our main line at [phone] and press option 4 to reach your doctor's medical assistant. If no one answers, please leave a voicemail as directed and we will return your call as soon as possible. Messages left after 4 pm will be answered the following business day.   You may also send us a message via MyChart. We typically respond to MyChart messages within 1-2 business days.  For prescription refills, please ask your  pharmacy to contact our office. Our fax number is [phone].  If you have an urgent issue when the clinic is closed that cannot wait until the next business day, you can page your doctor at the number below.    Please note that while we do our best to be available for urgent issues outside of office hours, we are not available 24/7.   If you have an urgent issue and are unable to reach us, you may choose to seek medical care at your doctor's office, retail clinic, urgent care center, or emergency room.  If you have a medical emergency, please immediately call 911 or go to the emergency department.  Pager Numbers  - Dr. Kowalski: [phone]  - Dr. Moye: [phone]  - Dr. Stewart: [phone]  In the event of inclement weather, please call our main line at [phone] for an update on the status of any delays or closures.  Dermatology Medication Tips: Please keep the boxes that topical medications come in in order to help keep track of the instructions about where and how to use these. Pharmacies typically print the medication instructions only on the boxes and not directly on the medication tubes.   If your medication is too expensive, please contact our office at [phone] option 4 or send us a message through MyChart.   We are unable to tell what your co-pay for medications will be in advance as this is different depending on your insurance coverage.   However, we may be able to find a substitute medication at lower cost or fill out paperwork to get insurance to cover a needed medication.   If a prior authorization is required to get your medication covered by your insurance company, please allow us 1-2 business days to complete this process.  Drug prices often vary depending on where the prescription is filled and some pharmacies may offer cheaper prices.  The website www.goodrx.com contains coupons for medications through different pharmacies. The prices here do not  account for what the cost may be with help from insurance (it may be cheaper with your insurance), but the website can give you the price if you did not use any insurance.  - You can print the associated coupon and take it with your prescription to the pharmacy.  - You may also stop by our office during regular business hours and pick up a GoodRx coupon card.  - If you need your prescription sent electronically to a different pharmacy, notify our office through [facility] MyChart or by phone at [phone] option 4.  

## 2021-01-14 NOTE — Progress Notes (Signed)
New Patient Visit  Subjective  Sherry Davila is a 75 y.o. female who presents for the following: Annual Exam (Mole check ). Hx of BCC, Hx of Dysplastic nevus  The patient presents for Total-Body Skin Exam (TBSE) for skin cancer screening and mole check.   The following portions of the chart were reviewed this encounter and updated as appropriate:   Tobacco  Allergies  Meds  Problems  Med Hx  Surg Hx  Fam Hx     Review of Systems:  No other skin or systemic complaints except as noted in HPI or Assessment and Plan.  Objective  Well appearing patient in no apparent distress; mood and affect are within normal limits.  A full examination was performed including scalp, head, eyes, ears, nose, lips, neck, chest, axillae, abdomen, back, buttocks, bilateral upper extremities, bilateral lower extremities, hands, feet, fingers, toes, fingernails, and toenails. All findings within normal limits unless otherwise noted below.  right cheek Erythematous keratotic or waxy stuck-on papule or plaque.   right superior chest Brown patch       Right anterior ankle Verrucous papules -- Discussed viral etiology and contagion.   left nose lateral to supratip Erythematous thin papules/macules with gritty scale.    Assessment & Plan  Inflamed seborrheic keratosis right cheek  Destruction of lesion - right cheek Complexity: simple   Destruction method: cryotherapy   Informed consent: discussed and consent obtained   Timeout:  patient name, date of birth, surgical site, and procedure verified Lesion destroyed using liquid nitrogen: Yes   Region frozen until ice ball extended beyond lesion: Yes   Outcome: patient tolerated procedure well with no complications   Post-procedure details: wound care instructions given    Neoplasm of skin right superior chest  Skin / nail biopsy Type of biopsy: tangential   Informed consent: discussed and consent obtained   Patient was prepped and  draped in usual sterile fashion: area prepped with alochol. Anesthesia: the lesion was anesthetized in a standard fashion   Anesthetic:  1% lidocaine w/ epinephrine 1-100,000 buffered w/ 8.4% NaHCO3 Instrument used: flexible razor blade   Hemostasis achieved with: pressure, aluminum chloride and electrodesiccation   Outcome: patient tolerated procedure well   Post-procedure details: wound care instructions given   Post-procedure details comment:  Ointment and small bandage  Specimen 1 - Surgical pathology Differential Diagnosis: ISK R/O CA   Check Margins: No  Other viral warts Right anterior ankle  Wart vs ISK  Discussed viral etiology and risk of spread.  Discussed multiple treatments may be required to clear warts.  Discussed possible post-treatment dyspigmentation and risk of recurrence.   Destruction of lesion - Right anterior ankle Complexity: simple   Destruction method: cryotherapy   Informed consent: discussed and consent obtained   Timeout:  patient name, date of birth, surgical site, and procedure verified Lesion destroyed using liquid nitrogen: Yes   Region frozen until ice ball extended beyond lesion: Yes   Outcome: patient tolerated procedure well with no complications   Post-procedure details: wound care instructions given    Actinic keratosis left nose lateral to supratip  Biopsy proven AK- recurrent   Destruction of lesion - left nose lateral to supratip  Destruction method: cryotherapy   Informed consent: discussed and consent obtained   Lesion destroyed using liquid nitrogen: Yes   Region frozen until ice ball extended beyond lesion: Yes   Outcome: patient tolerated procedure well with no complications   Post-procedure details: wound care instructions given  Skin cancer screening  Lentigines - Scattered tan macules - Due to sun exposure - Benign-appering, observe - Recommend daily broad spectrum sunscreen SPF 30+ to sun-exposed areas, reapply  every 2 hours as needed. - Call for any changes  Seborrheic Keratoses - Stuck-on, waxy, tan-brown papules and/or plaques  - Benign-appearing - Discussed benign etiology and prognosis. - Observe - Call for any changes  Melanocytic Nevi - Tan-brown and/or pink-flesh-colored symmetric macules and papules - Benign appearing on exam today - Observation - Call clinic for new or changing moles - Recommend daily use of broad spectrum spf 30+ sunscreen to sun-exposed areas.   Hemangiomas - Red papules - Discussed benign nature - Observe - Call for any changes  Actinic Damage - Chronic condition, secondary to cumulative UV/sun exposure - diffuse scaly erythematous macules with underlying dyspigmentation - Recommend daily broad spectrum sunscreen SPF 30+ to sun-exposed areas, reapply every 2 hours as needed.  - Staying in the shade or wearing long sleeves, sun glasses (UVA+UVB protection) and wide brim hats (4-inch brim around the entire circumference of the hat) are also recommended for sun protection.  - Call for new or changing lesions.  Purpura - Chronic; persistent and recurrent.  Treatable, but not curable. Arms  - Violaceous macules and patches - Benign - Related to trauma, age, sun damage and/or use of blood thinners, chronic use of topical and/or oral steroids - Observe - Can use OTC arnica containing moisturizer such as Dermend Bruise Formula if desired - Call for worsening or other concerns   History of Basal Cell Carcinoma of the Skin Right prox nasal ala 2019 - No evidence of recurrence today - Recommend regular full body skin exams - Recommend daily broad spectrum sunscreen SPF 30+ to sun-exposed areas, reapply every 2 hours as needed.  - Call if any new or changing lesions are noted between office visits   History of Dysplastic Nevi Right abdomen-mod to severe 2015 - No evidence of recurrence today - Recommend regular full body skin exams - Recommend daily broad  spectrum sunscreen SPF 30+ to sun-exposed areas, reapply every 2 hours as needed.  - Call if any new or changing lesions are noted between office visits   Skin cancer screening performed today.   Return in about 1 year (around 01/14/2022) for TBSE, Ak, hx of BCC.   IMarye Round, CMA, am acting as scribe for Sarina Ser, MD . Documentation: I have reviewed the above documentation for accuracy and completeness, and I agree with the above.  Sarina Ser, MD

## 2021-01-15 ENCOUNTER — Other Ambulatory Visit: Payer: Self-pay

## 2021-01-15 ENCOUNTER — Other Ambulatory Visit (INDEPENDENT_AMBULATORY_CARE_PROVIDER_SITE_OTHER): Payer: Medicare Other

## 2021-01-15 DIAGNOSIS — E785 Hyperlipidemia, unspecified: Secondary | ICD-10-CM

## 2021-01-15 DIAGNOSIS — I10 Essential (primary) hypertension: Secondary | ICD-10-CM

## 2021-01-15 LAB — LIPID PANEL
Cholesterol: 153 mg/dL (ref 0–200)
HDL: 75.7 mg/dL (ref >39.00)
LDL Cholesterol: 63 mg/dL (ref 0–99)
NonHDL: 77.49
Total CHOL/HDL Ratio: 2
Triglycerides: 70 mg/dL (ref 0.0–149.0)
VLDL: 14 mg/dL (ref 0.0–40.0)

## 2021-01-15 LAB — BASIC METABOLIC PANEL
BUN: 21 mg/dL (ref 6–23)
CO2: 26 mEq/L (ref 19–32)
Calcium: 8.8 mg/dL (ref 8.4–10.5)
Chloride: 105 mEq/L (ref 96–112)
Creatinine, Ser: 0.65 mg/dL (ref 0.40–1.20)
GFR: 87.16 mL/min (ref >60.00)
Glucose, Bld: 84 mg/dL (ref 70–99)
Potassium: 4 mEq/L (ref 3.5–5.1)
Sodium: 140 mEq/L (ref 135–145)

## 2021-01-15 LAB — HEPATIC FUNCTION PANEL
ALT: 17 U/L (ref 0–35)
AST: 15 U/L (ref 0–37)
Albumin: 4 g/dL (ref 3.5–5.2)
Alkaline Phosphatase: 96 U/L (ref 39–117)
Bilirubin, Direct: 0.1 mg/dL (ref 0.0–0.3)
Total Bilirubin: 0.5 mg/dL (ref 0.2–1.2)
Total Protein: 6.4 g/dL (ref 6.0–8.3)

## 2021-01-15 LAB — TSH: TSH: 2.29 u[IU]/mL (ref 0.35–5.50)

## 2021-01-21 ENCOUNTER — Telehealth: Payer: Self-pay

## 2021-01-21 NOTE — Telephone Encounter (Signed)
-----   Message from Ralene Bathe, MD sent at 01/19/2021  5:38 PM EDT ----- Diagnosis Skin , right superior chest SOLAR LENTIGO, WITH SCATTERED ATYPICAL MELANOCYTES, PERIPHERAL MARGIN INVOLVED, SEE DESCRIPTION  Benign large "freckle" with atypical melanocytes Could be classified as "PreCancer" Schedule within next 3 mos for appt and consider treatment with LN2

## 2021-01-21 NOTE — Telephone Encounter (Signed)
Left message for patient to call office for results/hd 

## 2021-01-21 NOTE — Telephone Encounter (Signed)
Left 2nd voicemail for patient to return call.aw

## 2021-01-21 NOTE — Telephone Encounter (Signed)
Patient advised of BX results. Scheduled 3 month follow up.

## 2021-02-25 DIAGNOSIS — H353132 Nonexudative age-related macular degeneration, bilateral, intermediate dry stage: Secondary | ICD-10-CM | POA: Diagnosis not present

## 2021-03-24 DIAGNOSIS — Z23 Encounter for immunization: Secondary | ICD-10-CM | POA: Diagnosis not present

## 2021-04-01 ENCOUNTER — Ambulatory Visit (INDEPENDENT_AMBULATORY_CARE_PROVIDER_SITE_OTHER): Payer: Medicare Other | Admitting: Internal Medicine

## 2021-04-01 ENCOUNTER — Other Ambulatory Visit: Payer: Self-pay

## 2021-04-01 ENCOUNTER — Ambulatory Visit (INDEPENDENT_AMBULATORY_CARE_PROVIDER_SITE_OTHER): Payer: Medicare Other

## 2021-04-01 VITALS — BP 120/70 | HR 60 | Temp 97.8°F | Resp 16 | Ht 66.0 in | Wt 228.0 lb

## 2021-04-01 DIAGNOSIS — R739 Hyperglycemia, unspecified: Secondary | ICD-10-CM | POA: Diagnosis not present

## 2021-04-01 DIAGNOSIS — I10 Essential (primary) hypertension: Secondary | ICD-10-CM

## 2021-04-01 DIAGNOSIS — F32A Depression, unspecified: Secondary | ICD-10-CM | POA: Diagnosis not present

## 2021-04-01 DIAGNOSIS — M25552 Pain in left hip: Secondary | ICD-10-CM | POA: Diagnosis not present

## 2021-04-01 DIAGNOSIS — E785 Hyperlipidemia, unspecified: Secondary | ICD-10-CM | POA: Diagnosis not present

## 2021-04-01 DIAGNOSIS — G4730 Sleep apnea, unspecified: Secondary | ICD-10-CM

## 2021-04-01 DIAGNOSIS — Z1231 Encounter for screening mammogram for malignant neoplasm of breast: Secondary | ICD-10-CM

## 2021-04-01 NOTE — Progress Notes (Signed)
Patient ID: Sherry Davila, female   DOB: August 21, 1945, 75 y.o.   MRN: [id number]   Subjective:    Patient ID: Sherry Davila, female    DOB: [birth year], 75 y.o.   MRN: [id number]  This visit occurred during the SARS-CoV-2 public health emergency.  Safety protocols were in place, including screening questions prior to the visit, additional usage of staff PPE, and extensive cleaning of exam room while observing appropriate contact time as indicated for disinfecting solutions.   Patient here for a scheduled follow up.   Chief Complaint  Patient presents with   Depression   Hip Pain   .   HPI Reports increased left hip pain.  Describes as a catching sensation.  Has had issues intermittently for a while.  Appears to notice more recently.  Only notices when up and walking.  No known injury or trauma recently.  Also reports increased stress and depression.  Has not desire to get out.  Not exercising.  No desire to go to the gym.  On prozac.  Feels symptoms have progressed.  No suicidal ideations.  Agreeable to see a therapist/psychiatrist.  Breathing stable.  No acid reflux reported. No abdominal pain.    Past Medical History:  Diagnosis Date   Actinic keratosis 04/04/2018   L nose lat to supra tip   Anxiety    Basal cell carcinoma 04/04/2018   R prox nasal ala   Depression    Dysplastic nevus 05/07/2013   R abdomen - mod to severe, excision 05/29/2013   Hyperlipidemia    Hypertension    Obesity    Sleep apnea    has a cpap, doesn't wear    Varicose vein    Past Surgical History:  Procedure Laterality Date   COLONOSCOPY WITH PROPOFOL N/A 09/08/2016   Procedure: COLONOSCOPY WITH PROPOFOL;  Surgeon: Jonathon Bellows, MD;  Location: [facility] ENDOSCOPY;  Service: Endoscopy;  Laterality: N/A;   COLONOSCOPY WITH PROPOFOL N/A 01/07/2020   Procedure: COLONOSCOPY WITH PROPOFOL;  Surgeon: Jonathon Bellows, MD;  Location: [facility] ENDOSCOPY;  Service: Gastroenterology;  Laterality: N/A;   FRACTURE SURGERY      foot, screws placed   JOINT REPLACEMENT Right 2009   knee   Family History  Problem Relation Age of Onset   Cancer Mother    Heart failure Mother    Heart disease Mother    Breast cancer Mother 26   Heart attack Mother    AAA (abdominal aortic aneurysm) Father    Hypertension Father    Heart disease Father    Hypertension Sister    Social History   Socioeconomic History   Marital status: Married    Spouse name: Not on file   Number of children: Not on file   Years of education: Not on file   Highest education level: Not on file  Occupational History   Not on file  Tobacco Use   Smoking status: Former    Types: Cigarettes    Quit date: 01/19/1988    Years since quitting: 33.2   Smokeless tobacco: Never  Vaping Use   Vaping Use: Never used  Substance and Sexual Activity   Alcohol use: Yes    Alcohol/week: 3.0 standard drinks    Types: 3 Shots of liquor per week    Comment: 3 drinks a week socially    Drug use: No   Sexual activity: Not on file  Other Topics Concern   Not on file  Social History Narrative   Not  on file   Social Determinants of Health   Financial Resource Strain: Low Risk    Difficulty of Paying Living Expenses: Not hard at all  Food Insecurity: No Food Insecurity   Worried About Charity fundraiser in the Last Year: Never true   Arboriculturist in the Last Year: Never true  Transportation Needs: No Transportation Needs   Lack of Transportation (Medical): No   Lack of Transportation (Non-Medical): No  Physical Activity: Insufficiently Active   Days of Exercise per Week: 3 days   Minutes of Exercise per Session: 30 min  Stress: No Stress Concern Present   Feeling of Stress : Not at all  Social Connections: Moderately Integrated   Frequency of Communication with Friends and Family: Never   Frequency of Social Gatherings with Friends and Family: More than three times a week   Attends Religious Services: More than 4 times per year   Active  Member of [employer] or Organizations: No   Attends Archivist Meetings: Never   Marital Status: Married     Review of Systems  Constitutional:  Positive for fatigue.       Not exercising.  Weight has increased.   HENT:  Negative for congestion and sinus pressure.   Respiratory:  Negative for cough, chest tightness and shortness of breath.   Cardiovascular:  Negative for chest pain, palpitations and leg swelling.  Gastrointestinal:  Negative for abdominal pain, diarrhea, nausea and vomiting.  Genitourinary:  Negative for difficulty urinating and dysuria.  Musculoskeletal:  Negative for joint swelling and myalgias.       Left hip pain as outlined.    Skin:  Negative for color change and rash.  Neurological:  Negative for dizziness, light-headedness and headaches.  Psychiatric/Behavioral:  Negative for agitation and suicidal ideas.        Feels down. Does not want to get out.        Objective:     BP 120/70   Pulse 60   Temp 97.8 F (36.6 C)   Resp 16   Ht '5\' 6"'  (1.676 m)   Wt 228 lb (103.4 kg)   SpO2 98%   BMI 36.80 kg/m  Wt Readings from Last 3 Encounters:  04/01/21 228 lb (103.4 kg)  12/25/20 226 lb 12.8 oz (102.9 kg)  10/09/20 221 lb 12.8 oz (100.6 kg)    Physical Exam Vitals reviewed.  Constitutional:      General: She is not in acute distress.    Appearance: Normal appearance.  HENT:     Head: Normocephalic and atraumatic.     Right Ear: External ear normal.     Left Ear: External ear normal.  Eyes:     General: No scleral icterus.       Right eye: No discharge.        Left eye: No discharge.     Conjunctiva/sclera: Conjunctivae normal.  Neck:     Thyroid: No thyromegaly.  Cardiovascular:     Rate and Rhythm: Normal rate and regular rhythm.  Pulmonary:     Effort: No respiratory distress.     Breath sounds: Normal breath sounds. No wheezing.  Abdominal:     General: Bowel sounds are normal.     Palpations: Abdomen is soft.     Tenderness:  There is no abdominal tenderness.  Musculoskeletal:        General: No swelling or tenderness.     Cervical back: Neck supple. No tenderness.  Comments: No pain left hip - abduction/adduction.    Lymphadenopathy:     Cervical: No cervical adenopathy.  Skin:    Findings: No erythema or rash.  Neurological:     Mental Status: She is alert.  Psychiatric:        Mood and Affect: Mood normal.        Behavior: Behavior normal.     Outpatient Encounter Medications as of 04/01/2021  Medication Sig   amLODipine (NORVASC) 5 MG tablet TAKE ONE TABLET BY MOUTH DAILY   atenolol (TENORMIN) 50 MG tablet TAKE ONE TABLET BY MOUTH DAILY   atorvastatin (LIPITOR) 40 MG tablet TAKE ONE TABLET BY MOUTH DAILY AT 6P.M.   FLUoxetine (PROZAC) 40 MG capsule TAKE ONE CAPSULE BY MOUTH DAILY   losartan-hydrochlorothiazide (HYZAAR) 100-12.5 MG tablet TAKE ONE TABLET BY MOUTH DAILY   No facility-administered encounter medications on file as of 04/01/2021.     Lab Results  Component Value Date   WBC 5.8 04/15/2020   HGB 13.3 04/15/2020   HCT 39.8 04/15/2020   PLT 230.0 04/15/2020   GLUCOSE 84 01/15/2021   CHOL 153 01/15/2021   TRIG 70.0 01/15/2021   HDL 75.70 01/15/2021   LDLCALC 63 01/15/2021   ALT 17 01/15/2021   AST 15 01/15/2021   NA 140 01/15/2021   K 4.0 01/15/2021   CL 105 01/15/2021   CREATININE 0.65 01/15/2021   BUN 21 01/15/2021   CO2 26 01/15/2021   TSH 2.29 01/15/2021   HGBA1C 5.6 10/07/2020      Assessment & Plan:   Problem List Items Addressed This Visit     Hyperglycemia    Low carb diet and exercise. Follow met b and a1c.       Relevant Orders   Hemoglobin A1c   Hyperlipidemia    On Crestor.  Low-cholesterol diet and exercise.  Follow lipid panel liver function test. Lab Results  Component Value Date   CHOL 153 01/15/2021   HDL 75.70 01/15/2021   LDLCALC 63 01/15/2021   TRIG 70.0 01/15/2021   CHOLHDL 2 01/15/2021       Relevant Orders   Hepatic function  panel   Lipid panel   Hypertension    Continue losartan/hctz, amlodipine and atenolol.  Follow pressures.  Follow metabolic panel.       Relevant Orders   CBC with Differential/Platelet   Basic metabolic panel   TSH   Left hip pain    Persistent intermittent pain as outlined.  Check xray.  May need PT and/or ortho referral.       Relevant Orders   DG Hip Unilat W OR W/O Pelvis 2-3 Views Left (Completed)   Mild depression    Increased stress and depression as outlined.  Not wanting to go out.  No suicidal ideations.  On prozac. Discussed further treatment and evaluation.  Agreeable to psych referral.        Sleep apnea    Continue CPAP.      Other Visit Diagnoses     Visit for screening mammogram    -  Primary   Relevant Orders   MM 3D SCREEN BREAST BILATERAL        Einar Pheasant, MD

## 2021-04-05 ENCOUNTER — Encounter: Payer: Self-pay | Admitting: Internal Medicine

## 2021-04-05 NOTE — Assessment & Plan Note (Signed)
Increased stress and depression as outlined.  Not wanting to go out.  No suicidal ideations.  On prozac. Discussed further treatment and evaluation.  Agreeable to psych referral.

## 2021-04-05 NOTE — Assessment & Plan Note (Signed)
On Crestor.  Low-cholesterol diet and exercise.  Follow lipid panel liver function test. Lab Results  Component Value Date   CHOL 153 01/15/2021   HDL 75.70 01/15/2021   LDLCALC 63 01/15/2021   TRIG 70.0 01/15/2021   CHOLHDL 2 01/15/2021

## 2021-04-05 NOTE — Assessment & Plan Note (Signed)
Continue CPAP.  

## 2021-04-05 NOTE — Assessment & Plan Note (Signed)
Continue losartan/hctz, amlodipine and atenolol.  Follow pressures.  Follow metabolic panel.

## 2021-04-05 NOTE — Assessment & Plan Note (Signed)
Low carb diet and exercise. Follow met b and a1c.  

## 2021-04-05 NOTE — Assessment & Plan Note (Signed)
Persistent intermittent pain as outlined.  Check xray.  May need PT and/or ortho referral.

## 2021-04-06 ENCOUNTER — Other Ambulatory Visit: Payer: Self-pay | Admitting: Internal Medicine

## 2021-04-06 DIAGNOSIS — M25552 Pain in left hip: Secondary | ICD-10-CM

## 2021-04-06 NOTE — Progress Notes (Signed)
Order placed for referral to PT

## 2021-04-12 ENCOUNTER — Other Ambulatory Visit: Payer: Self-pay | Admitting: Internal Medicine

## 2021-04-12 DIAGNOSIS — I10 Essential (primary) hypertension: Secondary | ICD-10-CM

## 2021-04-12 DIAGNOSIS — F3342 Major depressive disorder, recurrent, in full remission: Secondary | ICD-10-CM

## 2021-04-19 ENCOUNTER — Ambulatory Visit: Payer: Medicare Other

## 2021-04-19 ENCOUNTER — Other Ambulatory Visit: Payer: Self-pay

## 2021-04-19 NOTE — Progress Notes (Unsigned)
Pt did not show for scheduled appointment.  

## 2021-04-22 ENCOUNTER — Ambulatory Visit: Payer: Medicare Other | Admitting: Dermatology

## 2021-05-10 ENCOUNTER — Encounter: Payer: Self-pay | Admitting: Physical Therapy

## 2021-05-10 ENCOUNTER — Ambulatory Visit: Payer: Medicare Other | Attending: Internal Medicine | Admitting: Physical Therapy

## 2021-05-10 DIAGNOSIS — M25552 Pain in left hip: Secondary | ICD-10-CM | POA: Diagnosis not present

## 2021-05-10 DIAGNOSIS — M25551 Pain in right hip: Secondary | ICD-10-CM | POA: Diagnosis not present

## 2021-05-10 DIAGNOSIS — G8929 Other chronic pain: Secondary | ICD-10-CM | POA: Diagnosis not present

## 2021-05-10 DIAGNOSIS — R262 Difficulty in walking, not elsewhere classified: Secondary | ICD-10-CM | POA: Insufficient documentation

## 2021-05-10 DIAGNOSIS — M6281 Muscle weakness (generalized): Secondary | ICD-10-CM | POA: Insufficient documentation

## 2021-05-10 DIAGNOSIS — M545 Low back pain, unspecified: Secondary | ICD-10-CM | POA: Insufficient documentation

## 2021-05-10 NOTE — Therapy (Signed)
[facility] [address], [place], [address] Phone: [phone]   Fax:  [phone]  Physical Therapy Evaluation  Patient Details  Name: Sherry Davila MRN: [id number] Date of Birth: June 01, 1945 Referring Provider (PT): Einar Pheasant, MD   Encounter Date: 05/10/2021   PT End of Session - 05/11/21 1913     Visit Number 1    Number of Visits 24    Date for PT Re-Evaluation 08/02/21    Authorization Type MEDICARE PART B reporting period from 05/10/2021    Progress Note Due on Visit 10    PT Start Time 1735    PT Stop Time 1815    PT Time Calculation (min) 40 min    Activity Tolerance Patient tolerated treatment well    Behavior During Therapy [facility] for tasks assessed/performed             Past Medical History:  Diagnosis Date   Actinic keratosis 04/04/2018   L nose lat to supra tip   Anxiety    Basal cell carcinoma 04/04/2018   R prox nasal ala   Depression    Dysplastic nevus 05/07/2013   R abdomen - mod to severe, excision 05/29/2013   Hyperlipidemia    Hypertension    Obesity    Sleep apnea    has a cpap, doesn't wear    Varicose vein     Past Surgical History:  Procedure Laterality Date   COLONOSCOPY WITH PROPOFOL N/A 09/08/2016   Procedure: COLONOSCOPY WITH PROPOFOL;  Surgeon: Jonathon Bellows, MD;  Location: [facility] ENDOSCOPY;  Service: Endoscopy;  Laterality: N/A;   COLONOSCOPY WITH PROPOFOL N/A 01/07/2020   Procedure: COLONOSCOPY WITH PROPOFOL;  Surgeon: Jonathon Bellows, MD;  Location: [facility] ENDOSCOPY;  Service: Gastroenterology;  Laterality: N/A;   FRACTURE SURGERY     foot, screws placed   JOINT REPLACEMENT Right 2009   knee    There were no vitals filed for this visit.    Subjective Assessment - 05/10/21 1748     Subjective Patient reports both hips are bothering her. She states she is not walking very well. Her left hip "gives way" and "catches" randomly and the right hip is  difficult to flex to get into the driver's side of SUV. She doesn't notice it as much when she gets into the passenger seat of her husband's low car. She states his has been going on since Thanksgiving when it really started to become a problem. She did some extra housework and cooking around Thanksgiving. She thinks what really aggravated it was cleaning upstairs and decorating the large tree. Going up and down the stairs really irritates it and it has been worse since then. She has pain that goes down to the left knee but not below. She has a history of L knee pain. Had it checked a few years ago because that knee started giving her similar symptoms to what lead to R TKA but she was told it was fine after doing xrays. Has not had it checked recently. She has no history of spinal surgery, hip surgery,  or accidents affecting her back or hips. She has chronic low back pain for years prior to her hips getting worse. She does not recall it going down her legs or causing numbness or tingling before. She denies any numbness/tingling with the worsening of current hip pain. So far she has taken a bit of ibuprofen but otherwise no other treatment. She goes to the  gym sporadically over the last few years. She also feels she needs to lose weight. She feels clumsy and falls occasionally but she doesn't feel this is related to her hips.  She has been feeling depression about gaining weight even though she has been trying to lose weight and her weight just keeps getting up.    Pertinent History Patient is a 75 y.o. female who presents to outpatient physical therapy with a referral for medical diagnosis left hip pain. This patient's chief complaints consist of B hip pain, L > R, with most recent exacerbation starting end of Nov 2022 and chronic low back pain leading to the following functional deficits: difficulty with usual activities including getting in/out of the car, walking, going up/down stairs, getting up after sitting,  housecleaning, weight bearing activities, going to gym regularly (treadmill, elliptical, a few machines).  Relevant past medical history and comorbidities include HTN, obesity, skin cancer, sleep apnea, anxiety/depression, former smoker, R TKA in 2009.  Patient denies hx of stroke, seizures, lung problem, major cardiac events, diabetes, unexplained weight loss, changes in bowel or bladder problems, new onset stumbling or dropping things, spinal surgery.    Limitations House hold activities;Lifting;Walking;Standing   difficulty with usual activities including getting in/out of the car, walking, going up/down stairs, getting up after sitting, housecleaning, weight bearing activities, going to gym regularly (treadmill, elliptical, a few machines).   Diagnostic tests Hip radiograph report 04/03/2021: "IMPRESSION:  1. Mild symmetrical bilateral hip osteoarthritis.  2. Degenerative changes at the lumbosacral junction.  3. No acute bony abnormality."    Patient Stated Goals would like to be able to walk and not limp.    Currently in Pain? Yes    Pain Score 0-No pain   W: 5/10; B: 0/10   Pain Location Hip    Pain Orientation Left;Anterior   lateral groin aea is painful, low back but not buttocks, R LE feels heavy and hard to flex actively at hip.   Pain Descriptors / Indicators Other (Comment)   L "catching" "giving way," R: heavy, stiffness   Pain Type Acute pain    Pain Radiating Towards L possibly radiates towards L knee,  R LE does feel "heavy"    Pain Onset 1 to 4 weeks ago    Pain Frequency Intermittent    Aggravating Factors  getting in/out of the car, walking, going up/down stairs, getting up after sitting, housecleaning, weight bearing activities    Pain Relieving Factors sit down, rest, take ibuprofen (unsure if that helps).    Effect of Pain on Daily Activities getting in/out of the car, walking, going up/down stairs, getting up after sitting, housecleaning, weight bearing activities, going to gym  regularly (treadmill, elliptical, a few machines).                [facility] PT Assessment - 05/11/21 0001       Assessment   Medical Diagnosis left hip pain    Referring Provider (PT) Einar Pheasant, MD    Onset Date/Surgical Date 04/15/21    Hand Dominance Right    Prior Therapy none for this problem prior to current episode of care      Precautions   Precautions Fall      Balance Screen   Has the patient fallen in the past 6 months Yes    How many times? 2    Has the patient had a decrease in activity level because of a fear of falling?  No    Is  the patient reluctant to leave their home because of a fear of falling?  No      Home Environment   Living Environment --   no concerns about getting around home safely     Prior Function   Level of Independence Independent    Vocation Retired    [employer] retired from Engineer, mining work    Leisure eating and drinking, spending time with freinds, going to [employer], swimming      Cognition   Overall Cognitive Status Within Functional Limits for tasks assessed             OBJECTIVE  SELF- REPORTED FUNCTION FOTO score: 49/100 (hip questionnaire)  OBSERVATION/INSPECTION Posture  Posture (standing): left iliac crest slighlty higher than right, states scoliosis has been mentioned before. Increased lumbar lordosis.  Anthropometrics Tremor: none Muscle bulk: WFL Edema: mild edema both LE Functional Mobility Bed mobility: supine <> sit and rolling WFL Transfers: sit <> stand WFL Gait: mildly antalgic gait favoring one side or the other and reports popping pain in left hip intermittently, especially when changing directions.  SPINE MOTION Lumbar Spine AROM *Indicates pain Flexion: fingers to toes, no pain, normal range with OP.   Extension: WFL no pain with OP Side Flexion and rotation WFL for basic mobility, no pain with OP.   PERIPHERAL JOINT MOTION (in degrees) Passive Range of Motion (PROM) B LE  grossly WFL for basic mobility except the following:  L hip IR and flexion 25% limited with concordant pain at left groin.   MUSCLE PERFORMANCE (MMT):  *Indicates pain 05/10/21 Date Date  Joint/Motion R/L R/L R/L  Hip     Flexion (L1, L2) 4/4 / /  Extension (knee ext) / / /  Extension (knee flex) / / /  Abduction 3+/4 / /  Adduction / / /  External rotation 5/5 / /  Internal rotation  5/5 / /  Knee     Extension (L3) 5/5 / /  Flexion (S2) 5/5 / /  Ankle/Foot     Dorsiflexion (L4) 5/5 / /  Great toe extension (L5) 4/4 / /  Eversion (S1) 5/5 / /  Plantarflexion (S1) 5/5 / /  Comments:  05/10/2021: L hip ER causes popping in left knee.   SPECIAL TESTS:  LOWER LIMB NEURODYNAMIC TEST Straight Leg Raise (Sciatic nerve)  R  = negative  L  = negative  HIP SPECIAL TESTS FADDIR: R = negative, L = positive for concordant groin pain. FABER: R = negative, L = positive for concordant groin pain and loss of ROM. SCOUR: R = negative, L = positive for concordant pain but not popping/catching. ER DEROTATION TEST: R = negative, L = negative  ACCESSORY MOTION:  LAD through bilateral hips non-painful  Objective measurements completed on examination: See above findings.      PT Education - 05/11/21 1912     Education Details Education on diagnosis, prognosis, POC, anatomy and physiology of current condition    Person(s) Educated Patient    Methods Explanation    Comprehension Verbalized understanding;Need further instruction              PT Short Term Goals - 05/11/21 1922       PT SHORT TERM GOAL #1   Title Be independent with initial home exercise program for self-management of symptoms.    Baseline initial HEP to be given at visit 2 as appropriate (05/10/2021);    Time 2    Period Weeks  Status New    Target Date 05/25/21               PT Long Term Goals - 05/11/21 1924       PT LONG TERM GOAL #1   Title Be independent with a long-term home exercise  program for self-management of symptoms.    Baseline initial HEP to be provided at visit 2 as appropriate (05/10/2021);    Time 12    Period Weeks    Status New   TARGET DATE FOR ALL LONG TERM GOALS: 08/02/2021     PT LONG TERM GOAL #2   Title Demonstrate improved FOTO to equal or greater than 60 by visit #11 to demonstrate improvement in overall condition and self-reported functional ability.    Baseline 49 (05/10/2021);    Time 12    Period Weeks    Status New      PT LONG TERM GOAL #3   Title Patient will ambulate up and down 12 steps with no difficulty or increased pain due to current condition to improve her ability to navigate her home and community    Baseline reported painful and difficult (05/10/2021);    Time 12    Period Weeks    Status New      PT LONG TERM GOAL #4   Title Patient will ambulate equal or greater than 1400 feet during 6 minute walk test with no increase in pain to demonstrate improved community mobility to improve pateint's ability to return to walking for exercise.    Baseline to be tested visit 2 as appropriate (05/10/2021);    Time 12    Period Weeks    Status New      PT LONG TERM GOAL #5   Title Patient will balance 30 seconds or more on each foot in SLS to demonstrate improved hip stability and proprioceptive control at allow her to complete weight bearing activities such as walking at the grocery store with less difficulty.    Baseline to be tested visit 2 as appropriate (05/10/2021);    Time 12    Period Weeks    Status New      Additional Long Term Goals   Additional Long Term Goals Yes      PT LONG TERM GOAL #6   Title Complete community, work and/or recreational activities without limitation due to current condition.    Baseline Functional Limitations: difficulty with usual activities including getting in/out of the car, walking, going up/down stairs, getting up after sitting, housecleaning, weight bearing activities, going to gym regularly  (treadmill, elliptical, a few machines) (05/10/2021);    Time 12    Period Weeks    Status New                    Plan - 05/11/21 1920     Clinical Impression Statement Patient is a 75 y.o. female referred to outpatient physical therapy with a medical diagnosis of left hip pain who presents with signs and symptoms consistent with left hip pain that appears to have strong intra-articular contribution in the setting of chronic low back pain and possible R hip pain. Tests suggesting intra-articular problem in left hip were positive but were negative in the R hip. Screen of the back did not reproduce any concordant pain that seemed relevant to hips. Report of popping, catching, and giving way is suggestive of a possible labral tear in the absence of moderate to severe arthritic findings on radiograph report.  Patient presents with significant pain, ROM, posture, joint stiffness, muscle performance (strength/power/endurance) and activity tolerance impairments that are limiting ability to complete her usual activities including getting in/out of the car, walking, going up/down stairs, getting up after sitting, housecleaning, weight bearing activities, going to gym regularly (treadmill, elliptical, a few machines) without difficulty and decreases quality of life. Patient will benefit from skilled physical therapy intervention to address current body structure impairments and activity limitations to improve function and work towards goals set in current POC in order to return to prior level of function or maximal functional improvement.    Personal Factors and Comorbidities Age;Comorbidity 3+;Past/Current Experience;Fitness    Comorbidities HTN, obesity, skin cancer, sleep apnea, anxiety/depression, former smoker, R TKA in 2009.    Examination-Activity Limitations Locomotion Level;Stand;Stairs;Squat;Transfers;Caring for Others    Examination-Participation Restrictions Other;Community  Activity;Cleaning;Laundry;Interpersonal Relationship;Shop   difficulty with usual activities including getting in/out of the car, walking, going up/down stairs, getting up after sitting, housecleaning, weight bearing activities, going to gym regularly (treadmill, elliptical, a few machines).   Stability/Clinical Decision Making Stable/Uncomplicated    Clinical Decision Making Low    Rehab Potential Good    PT Frequency 2x / week    PT Duration 12 weeks    PT Treatment/Interventions ADLs/Self Care Home Management;Aquatic Therapy;Cryotherapy;Moist Heat;Electrical Stimulation;Stair training;Gait training;Functional mobility training;Therapeutic activities;Therapeutic exercise;Balance training;Neuromuscular re-education;Manual techniques;Dry needling;Passive range of motion;Joint Manipulations;Spinal Manipulations;Patient/family education    PT Next Visit Plan update HEP as appropriate, hip and LE strengthening progressing to functional strengthening as tolerated    PT Home Exercise Plan TBD    Consulted and Agree with Plan of Care Patient             Patient will benefit from skilled therapeutic intervention in order to improve the following deficits and impairments:  Abnormal gait, Improper body mechanics, Pain, Decreased mobility, Decreased activity tolerance, Decreased endurance, Decreased range of motion, Decreased strength, Hypomobility, Impaired perceived functional ability, Decreased balance, Difficulty walking  Visit Diagnosis: Pain in left hip  Pain in right hip  Chronic bilateral low back pain, unspecified whether sciatica present  Muscle weakness (generalized)  Difficulty in walking, not elsewhere classified     Problem List Patient Active Problem List   Diagnosis Date Noted   Left hip pain 04/01/2021   Hyperglycemia 08/30/2020   Obesity (BMI 30-39.9) 04/20/2020   Acid reflux 10/13/2019   Chest pain 10/11/2019   Healthcare maintenance 10/01/2019   Constipation  10/28/2018   Personal history of other malignant neoplasm of skin 08/06/2018   Sleep apnea 09/28/2017   Atrophic vaginitis 01/19/2015   Hypertension    Hyperlipidemia    Anxiety    Mild depression     Everlean Alstrom. Graylon Good, PT, DPT 05/11/21, 7:30 PM  [facility] [address] [place], [address] Phone: [phone]   Fax:  [phone]  Name: Sherry Davila MRN: [id number] Date of Birth: 1945/07/13

## 2021-05-11 ENCOUNTER — Encounter: Payer: Self-pay | Admitting: Physical Therapy

## 2021-05-12 ENCOUNTER — Encounter: Payer: Self-pay | Admitting: Physical Therapy

## 2021-05-12 ENCOUNTER — Other Ambulatory Visit: Payer: Self-pay

## 2021-05-12 ENCOUNTER — Ambulatory Visit: Payer: Medicare Other | Admitting: Physical Therapy

## 2021-05-12 DIAGNOSIS — G8929 Other chronic pain: Secondary | ICD-10-CM

## 2021-05-12 DIAGNOSIS — M545 Low back pain, unspecified: Secondary | ICD-10-CM | POA: Diagnosis not present

## 2021-05-12 DIAGNOSIS — M6281 Muscle weakness (generalized): Secondary | ICD-10-CM | POA: Diagnosis not present

## 2021-05-12 DIAGNOSIS — R262 Difficulty in walking, not elsewhere classified: Secondary | ICD-10-CM

## 2021-05-12 DIAGNOSIS — M25552 Pain in left hip: Secondary | ICD-10-CM | POA: Diagnosis not present

## 2021-05-12 DIAGNOSIS — M25551 Pain in right hip: Secondary | ICD-10-CM | POA: Diagnosis not present

## 2021-05-12 NOTE — Therapy (Signed)
[facility] [address] [place], [address] Phone: [phone]   Fax:  [phone]  Physical Therapy Treatment  Patient Details  Name: Ilynn Stauffer MRN: [id number] Date of Birth: August 11, 1945 Referring Provider (PT): Einar Pheasant, MD   Encounter Date: 05/12/2021   PT End of Session - 05/12/21 1923     Visit Number 2    Number of Visits 24    Date for PT Re-Evaluation 08/02/21    Authorization Type MEDICARE PART B reporting period from 05/10/2021    Progress Note Due on Visit 10    PT Start Time 1737    PT Stop Time 1815    PT Time Calculation (min) 38 min    Activity Tolerance Patient tolerated treatment well    Behavior During Therapy [facility] for tasks assessed/performed             Past Medical History:  Diagnosis Date   Actinic keratosis 04/04/2018   L nose lat to supra tip   Anxiety    Basal cell carcinoma 04/04/2018   R prox nasal ala   Depression    Dysplastic nevus 05/07/2013   R abdomen - mod to severe, excision 05/29/2013   Hyperlipidemia    Hypertension    Obesity    Sleep apnea    has a cpap, doesn't wear    Varicose vein     Past Surgical History:  Procedure Laterality Date   COLONOSCOPY WITH PROPOFOL N/A 09/08/2016   Procedure: COLONOSCOPY WITH PROPOFOL;  Surgeon: Jonathon Bellows, MD;  Location: [facility] ENDOSCOPY;  Service: Endoscopy;  Laterality: N/A;   COLONOSCOPY WITH PROPOFOL N/A 01/07/2020   Procedure: COLONOSCOPY WITH PROPOFOL;  Surgeon: Jonathon Bellows, MD;  Location: [facility] ENDOSCOPY;  Service: Gastroenterology;  Laterality: N/A;   FRACTURE SURGERY     foot, screws placed   JOINT REPLACEMENT Right 2009   knee    There were no vitals filed for this visit.   Subjective Assessment - 05/12/21 1738     Subjective patinet reports 5/10 in her low back and her hips, L > R. She states it has been hard to lift both legs today and she has had to use her arms for that. She states  she was not extra sore after her initial eval and felt okay yesterday. She states today has been bad and she has been walking "like an old lady"    Pertinent History Patient is a 75 y.o. female who presents to outpatient physical therapy with a referral for medical diagnosis left hip pain. This patient's chief complaints consist of B hip pain, L > R, with most recent exacerbation starting end of Nov 2022 and chronic low back pain leading to the following functional deficits: difficulty with usual activities including getting in/out of the car, walking, going up/down stairs, getting up after sitting, housecleaning, weight bearing activities, going to gym regularly (treadmill, elliptical, a few machines).  Relevant past medical history and comorbidities include HTN, obesity, skin cancer, sleep apnea, anxiety/depression, former smoker, R TKA in 2009.  Patient denies hx of stroke, seizures, lung problem, major cardiac events, diabetes, unexplained weight loss, changes in bowel or bladder problems, new onset stumbling or dropping things, spinal surgery.    Limitations House hold activities;Lifting;Walking;Standing   difficulty with usual activities including getting in/out of the car, walking, going up/down stairs, getting up after sitting, housecleaning, weight bearing activities, going to gym regularly (treadmill, elliptical, a few machines).   Diagnostic  tests Hip radiograph report 04/03/2021: "IMPRESSION:  1. Mild symmetrical bilateral hip osteoarthritis.  2. Degenerative changes at the lumbosacral junction.  3. No acute bony abnormality."    Patient Stated Goals would like to be able to walk and not limp.    Currently in Pain? Yes    Pain Score 5     Pain Onset 1 to 4 weeks ago             OBJECTIVE   FUNCTIONAL/BALANCE TESTS 6 Minute Walk Test: 1200 painful catching in left hip that got better with distance.  SLS (best of 3 trials): R 14 seconds,  L 9 seconds  TREATMENT:   Therapeutic  exercise: to centralize symptoms and improve ROM, strength, muscular endurance, and activity tolerance required for successful completion of functional activities.  - ambulation around clinic for distance in 6 minutes - see above - SLS balance 3 trials for max time each side (see above). Then 3x30 seconds each side with 1 finger support on TM.  (Manual therapy -see below) - hooklying bridge, 1x20 with 5 second holds - seated hip adduction against pillow, 1x20, 5 second holds (discomfort in left hip during, no worse) - seated hip abduction against towel, 1x5 too easy - seated hip abduction against black theraband around knees, 1x10 with 5 second hold.  - seated isometric hip flexion into hand, 1x5 each side with 5 second hold (discomfort in left hip during, no worse).  - Education on HEP including handout   Manual therapy: to reduce pain and tissue tension, improve range of motion, neuromodulation, in order to promote improved ability to complete functional activities. - hooklying LAD 3x30 seconds through each hip  Exercise purpose/form. Self management techniques. Education on diagnosis, prognosis, POC, anatomy and physiology of current condition Education on HEP including handout    HOME EXERCISE PROGRAM Access Code: [email or web address] URL: [email or web address] Date: 05/12/2021 Prepared by: Rosita Kea  Exercises Seated Isometric Hip Adduction with Diona Foley - 1 x daily - 2 sets - 10 reps - 5 seconds hold Seated Hip Abduction with Resistance - 1 x daily - 1 sets - 20 reps - 5 seconds hold Isometric hip flexion - 1 x daily - 2 sets - 10 reps - 5 seconds hold Single Leg Stance - 1 x daily - 3 sets - 30 secoonds hold Bridge - 1 x daily - 1 sets - 20 reps - 5 seconds hold    PT Education - 05/12/21 1918     Education Details Exercise purpose/form. Self management techniques    Person(s) Educated Patient    Methods Explanation;Demonstration;Tactile cues;Verbal cues;Handout     Comprehension Verbalized understanding;Returned demonstration;Verbal cues required;Tactile cues required;Need further instruction              PT Short Term Goals - 05/11/21 1922       PT SHORT TERM GOAL #1   Title Be independent with initial home exercise program for self-management of symptoms.    Baseline initial HEP to be given at visit 2 as appropriate (05/10/2021);    Time 2    Period Weeks    Status New    Target Date 05/25/21               PT Long Term Goals - 05/11/21 1924       PT LONG TERM GOAL #1   Title Be independent with a long-term home exercise program for self-management of symptoms.    Baseline initial HEP to be provided at visit  2 as appropriate (05/10/2021);    Time 12    Period Weeks    Status New   TARGET DATE FOR ALL LONG TERM GOALS: 08/02/2021     PT LONG TERM GOAL #2   Title Demonstrate improved FOTO to equal or greater than 60 by visit #11 to demonstrate improvement in overall condition and self-reported functional ability.    Baseline 49 (05/10/2021);    Time 12    Period Weeks    Status New      PT LONG TERM GOAL #3   Title Patient will ambulate up and down 12 steps with no difficulty or increased pain due to current condition to improve her ability to navigate her home and community    Baseline reported painful and difficult (05/10/2021);    Time 12    Period Weeks    Status New      PT LONG TERM GOAL #4   Title Patient will ambulate equal or greater than 1400 feet during 6 minute walk test with no increase in pain to demonstrate improved community mobility to improve pateint's ability to return to walking for exercise.    Baseline to be tested visit 2 as appropriate (05/10/2021);    Time 12    Period Weeks    Status New      PT LONG TERM GOAL #5   Title Patient will balance 30 seconds or more on each foot in SLS to demonstrate improved hip stability and proprioceptive control at allow her to complete weight bearing activities  such as walking at the grocery store with less difficulty.    Baseline to be tested visit 2 as appropriate (05/10/2021);    Time 12    Period Weeks    Status New      Additional Long Term Goals   Additional Long Term Goals Yes      PT LONG TERM GOAL #6   Title Complete community, work and/or recreational activities without limitation due to current condition.    Baseline Functional Limitations: difficulty with usual activities including getting in/out of the car, walking, going up/down stairs, getting up after sitting, housecleaning, weight bearing activities, going to gym regularly (treadmill, elliptical, a few machines) (05/10/2021);    Time 12    Period Weeks    Status New                   Plan - 05/12/21 1923     Clinical Impression Statement Patient arrives with pain in both hips and antalgic gait that improved during 6 min walk test. Initialed tolerated hip strengthening and proprioceptive exercises this session. Patient reported mild decrease in pain by end of session but did note that sitting seemed to make her hips feel stiffer. Patient would benefit from continued management of limiting condition by skilled physical therapist to address remaining impairments and functional limitations to work towards stated goals and return to PLOF or maximal functional independence.    Personal Factors and Comorbidities Age;Comorbidity 3+;Past/Current Experience;Fitness    Comorbidities HTN, obesity, skin cancer, sleep apnea, anxiety/depression, former smoker, R TKA in 2009.    Examination-Activity Limitations Locomotion Level;Stand;Stairs;Squat;Transfers;Caring for Others    Examination-Participation Restrictions Other;Community Activity;Cleaning;Laundry;Interpersonal Relationship;Shop   difficulty with usual activities including getting in/out of the car, walking, going up/down stairs, getting up after sitting, housecleaning, weight bearing activities, going to gym regularly (treadmill,  elliptical, a few machines).   Stability/Clinical Decision Making Stable/Uncomplicated    Rehab Potential Good    PT  Frequency 2x / week    PT Duration 12 weeks    PT Treatment/Interventions ADLs/Self Care Home Management;Aquatic Therapy;Cryotherapy;Moist Heat;Electrical Stimulation;Stair training;Gait training;Functional mobility training;Therapeutic activities;Therapeutic exercise;Balance training;Neuromuscular re-education;Manual techniques;Dry needling;Passive range of motion;Joint Manipulations;Spinal Manipulations;Patient/family education    PT Next Visit Plan update HEP as appropriate, hip and LE strengthening progressing to functional strengthening as tolerated    PT Home Exercise Plan Medbridge Access Code: [email or web address]    Consulted and Agree with Plan of Care Patient             Patient will benefit from skilled therapeutic intervention in order to improve the following deficits and impairments:  Abnormal gait, Improper body mechanics, Pain, Decreased mobility, Decreased activity tolerance, Decreased endurance, Decreased range of motion, Decreased strength, Hypomobility, Impaired perceived functional ability, Decreased balance, Difficulty walking  Visit Diagnosis: Pain in left hip  Pain in right hip  Chronic bilateral low back pain, unspecified whether sciatica present  Muscle weakness (generalized)  Difficulty in walking, not elsewhere classified     Problem List Patient Active Problem List   Diagnosis Date Noted   Left hip pain 04/01/2021   Hyperglycemia 08/30/2020   Obesity (BMI 30-39.9) 04/20/2020   Acid reflux 10/13/2019   Chest pain 10/11/2019   Healthcare maintenance 10/01/2019   Constipation 10/28/2018   Personal history of other malignant neoplasm of skin 08/06/2018   Sleep apnea 09/28/2017   Atrophic vaginitis 01/19/2015   Hypertension    Hyperlipidemia    Anxiety    Mild depression     Everlean Alstrom. Graylon Good, PT, DPT 05/12/21, 7:25 PM   [facility] [address] [place], [address] Phone: [phone]   Fax:  [phone]  Name: Keonta Monceaux MRN: [id number] Date of Birth: 1945/12/05

## 2021-05-19 ENCOUNTER — Ambulatory Visit: Payer: Medicare Other | Admitting: Physical Therapy

## 2021-05-26 ENCOUNTER — Encounter: Payer: Medicare Other | Admitting: Physical Therapy

## 2021-05-31 ENCOUNTER — Encounter: Payer: Medicare Other | Admitting: Physical Therapy

## 2021-06-03 ENCOUNTER — Encounter: Payer: Medicare Other | Admitting: Physical Therapy

## 2021-06-07 ENCOUNTER — Encounter: Payer: Medicare Other | Admitting: Physical Therapy

## 2021-06-10 ENCOUNTER — Encounter: Payer: Medicare Other | Admitting: Physical Therapy

## 2021-06-15 ENCOUNTER — Encounter: Payer: Medicare Other | Admitting: Physical Therapy

## 2021-06-17 ENCOUNTER — Encounter: Payer: Medicare Other | Admitting: Physical Therapy

## 2021-06-22 ENCOUNTER — Encounter: Payer: Medicare Other | Admitting: Physical Therapy

## 2021-06-23 ENCOUNTER — Other Ambulatory Visit: Payer: Self-pay

## 2021-06-23 ENCOUNTER — Other Ambulatory Visit (INDEPENDENT_AMBULATORY_CARE_PROVIDER_SITE_OTHER): Payer: Medicare Other

## 2021-06-23 DIAGNOSIS — R739 Hyperglycemia, unspecified: Secondary | ICD-10-CM | POA: Diagnosis not present

## 2021-06-23 DIAGNOSIS — E785 Hyperlipidemia, unspecified: Secondary | ICD-10-CM | POA: Diagnosis not present

## 2021-06-23 DIAGNOSIS — I10 Essential (primary) hypertension: Secondary | ICD-10-CM

## 2021-06-23 LAB — CBC WITH DIFFERENTIAL/PLATELET
Basophils Absolute: 0.1 10*3/uL (ref 0.0–0.1)
Basophils Relative: 1.6 % (ref 0.0–3.0)
Eosinophils Absolute: 0.4 10*3/uL (ref 0.0–0.7)
Eosinophils Relative: 6.8 % — ABNORMAL HIGH (ref 0.0–5.0)
HCT: 39.8 % (ref 36.0–46.0)
Hemoglobin: 13.3 g/dL (ref 12.0–15.0)
Lymphocytes Relative: 36.3 % (ref 12.0–46.0)
Lymphs Abs: 2.2 10*3/uL (ref 0.7–4.0)
MCHC: 33.3 g/dL (ref 30.0–36.0)
MCV: 90.6 fl (ref 78.0–100.0)
Monocytes Absolute: 0.7 10*3/uL (ref 0.1–1.0)
Monocytes Relative: 11.3 % (ref 3.0–12.0)
Neutro Abs: 2.6 10*3/uL (ref 1.4–7.7)
Neutrophils Relative %: 44 % (ref 43.0–77.0)
Platelets: 235 10*3/uL (ref 150.0–400.0)
RBC: 4.4 Mil/uL (ref 3.87–5.11)
RDW: 13.9 % (ref 11.5–15.5)
WBC: 6 10*3/uL (ref 4.0–10.5)

## 2021-06-23 LAB — BASIC METABOLIC PANEL
BUN: 26 mg/dL — ABNORMAL HIGH (ref 6–23)
CO2: 27 mEq/L (ref 19–32)
Calcium: 8.9 mg/dL (ref 8.4–10.5)
Chloride: 105 mEq/L (ref 96–112)
Creatinine, Ser: 0.69 mg/dL (ref 0.40–1.20)
GFR: 85.65 mL/min (ref >60.00)
Glucose, Bld: 97 mg/dL (ref 70–99)
Potassium: 4.4 mEq/L (ref 3.5–5.1)
Sodium: 139 mEq/L (ref 135–145)

## 2021-06-23 LAB — LIPID PANEL
Cholesterol: 171 mg/dL (ref 0–200)
HDL: 82.8 mg/dL (ref >39.00)
LDL Cholesterol: 72 mg/dL (ref 0–99)
NonHDL: 88.06
Total CHOL/HDL Ratio: 2
Triglycerides: 82 mg/dL (ref 0.0–149.0)
VLDL: 16.4 mg/dL (ref 0.0–40.0)

## 2021-06-23 LAB — HEPATIC FUNCTION PANEL
ALT: 16 U/L (ref 0–35)
AST: 18 U/L (ref 0–37)
Albumin: 4 g/dL (ref 3.5–5.2)
Alkaline Phosphatase: 104 U/L (ref 39–117)
Bilirubin, Direct: 0.1 mg/dL (ref 0.0–0.3)
Total Bilirubin: 0.5 mg/dL (ref 0.2–1.2)
Total Protein: 6.2 g/dL (ref 6.0–8.3)

## 2021-06-23 LAB — TSH: TSH: 4.08 u[IU]/mL (ref 0.35–5.50)

## 2021-06-23 LAB — HEMOGLOBIN A1C: Hgb A1c MFr Bld: 5.5 % (ref 4.6–6.5)

## 2021-07-06 ENCOUNTER — Ambulatory Visit (INDEPENDENT_AMBULATORY_CARE_PROVIDER_SITE_OTHER): Payer: Medicare Other | Admitting: Internal Medicine

## 2021-07-06 ENCOUNTER — Encounter: Payer: Self-pay | Admitting: Internal Medicine

## 2021-07-06 ENCOUNTER — Other Ambulatory Visit: Payer: Self-pay

## 2021-07-06 VITALS — BP 130/78 | HR 98 | Temp 99.0°F | Resp 16 | Wt 226.2 lb

## 2021-07-06 DIAGNOSIS — R739 Hyperglycemia, unspecified: Secondary | ICD-10-CM

## 2021-07-06 DIAGNOSIS — I10 Essential (primary) hypertension: Secondary | ICD-10-CM | POA: Diagnosis not present

## 2021-07-06 DIAGNOSIS — M79605 Pain in left leg: Secondary | ICD-10-CM | POA: Diagnosis not present

## 2021-07-06 DIAGNOSIS — G4730 Sleep apnea, unspecified: Secondary | ICD-10-CM | POA: Diagnosis not present

## 2021-07-06 DIAGNOSIS — M79604 Pain in right leg: Secondary | ICD-10-CM

## 2021-07-06 DIAGNOSIS — E785 Hyperlipidemia, unspecified: Secondary | ICD-10-CM | POA: Diagnosis not present

## 2021-07-06 DIAGNOSIS — F320 Major depressive disorder, single episode, mild: Secondary | ICD-10-CM | POA: Diagnosis not present

## 2021-07-06 NOTE — Progress Notes (Signed)
Patient ID: Sherry Davila, female   DOB: 20-Mar-1946, 76 y.o.   MRN: [id number]   Subjective:    Patient ID: Sherry Davila, female    DOB: [birth year], 76 y.o.   MRN: [id number]  This visit occurred during the SARS-CoV-2 public health emergency.  Safety protocols were in place, including screening questions prior to the visit, additional usage of staff PPE, and extensive cleaning of exam room while observing appropriate contact time as indicated for disinfecting solutions.   Patient here for a scheduled follow up.  Marland Kitchen   HPI Here to follow up regarding her blood pressure, cholesterol and increased stress.  She reports regarding her mental stress and depression, she is doing better.  Feels better.  Does not feel needs any further intervention.  Tries to stay active.  Her legs are bothering her now.  Reports some low back pain and left hip pain, but mostly desires bilateral leg aching.  Thighs aching.  No rash.  No fever.  No headache.  No known trigger.  No new activity.  No chest pain or sob reported.  No cough or congestion. No abdominal pain or cramping. Dicussed labs.    Past Medical History:  Diagnosis Date   Actinic keratosis 04/04/2018   L nose lat to supra tip   Anxiety    Basal cell carcinoma 04/04/2018   R prox nasal ala   Depression    Dysplastic nevus 05/07/2013   R abdomen - mod to severe, excision 05/29/2013   Hyperlipidemia    Hypertension    Obesity    Sleep apnea    has a cpap, doesn't wear    Varicose vein    Past Surgical History:  Procedure Laterality Date   COLONOSCOPY WITH PROPOFOL N/A 09/08/2016   Procedure: COLONOSCOPY WITH PROPOFOL;  Surgeon: Jonathon Bellows, MD;  Location: [facility] ENDOSCOPY;  Service: Endoscopy;  Laterality: N/A;   COLONOSCOPY WITH PROPOFOL N/A 01/07/2020   Procedure: COLONOSCOPY WITH PROPOFOL;  Surgeon: Jonathon Bellows, MD;  Location: [facility] ENDOSCOPY;  Service: Gastroenterology;  Laterality: N/A;   FRACTURE SURGERY     foot, screws placed   JOINT  REPLACEMENT Right 2009   knee   Family History  Problem Relation Age of Onset   Cancer Mother    Heart failure Mother    Heart disease Mother    Breast cancer Mother 9   Heart attack Mother    AAA (abdominal aortic aneurysm) Father    Hypertension Father    Heart disease Father    Hypertension Sister    Social History   Socioeconomic History   Marital status: Married    Spouse name: Not on file   Number of children: Not on file   Years of education: Not on file   Highest education level: Not on file  Occupational History   Not on file  Tobacco Use   Smoking status: Former    Types: Cigarettes    Quit date: 01/19/1988    Years since quitting: 33.4   Smokeless tobacco: Never  Vaping Use   Vaping Use: Never used  Substance and Sexual Activity   Alcohol use: Yes    Alcohol/week: 3.0 standard drinks    Types: 3 Shots of liquor per week    Comment: 3 drinks a week socially    Drug use: No   Sexual activity: Not on file  Other Topics Concern   Not on file  Social History Narrative   Not on file   Social Determinants  of Health   Financial Resource Strain: Low Risk    Difficulty of Paying Living Expenses: Not hard at all  Food Insecurity: No Food Insecurity   Worried About [place] in the Last Year: Never true   [place] in the Last Year: Never true  Transportation Needs: No Transportation Needs   Lack of Transportation (Medical): No   Lack of Transportation (Non-Medical): No  Physical Activity: Insufficiently Active   Days of Exercise per Week: 3 days   Minutes of Exercise per Session: 30 min  Stress: No Stress Concern Present   Feeling of Stress : Not at all  Social Connections: Moderately Integrated   Frequency of Communication with Friends and Family: Never   Frequency of Social Gatherings with Friends and Family: More than three times a week   Attends Religious Services: More than 4 times per year   Active Member of [employer] or Organizations:  No   Attends Archivist Meetings: Never   Marital Status: Married     Review of Systems  Constitutional:  Negative for appetite change and unexpected weight change.  HENT:  Negative for congestion and sinus pressure.   Respiratory:  Negative for cough, chest tightness and shortness of breath.   Cardiovascular:  Negative for chest pain, palpitations and leg swelling.  Gastrointestinal:  Negative for abdominal pain, diarrhea, nausea and vomiting.  Genitourinary:  Negative for difficulty urinating and dysuria.  Musculoskeletal:  Negative for joint swelling.       Leg aching as outlined.    Skin:  Negative for color change and rash.  Neurological:  Negative for dizziness, light-headedness and headaches.  Psychiatric/Behavioral:  Negative for agitation and dysphoric mood.       Objective:     BP 130/78    Pulse 98    Temp 99 F (37.2 C)    Resp 16    Wt 226 lb 3.2 oz (102.6 kg)    SpO2 98%    BMI 36.51 kg/m  Wt Readings from Last 3 Encounters:  07/06/21 226 lb 3.2 oz (102.6 kg)  04/01/21 228 lb (103.4 kg)  12/25/20 226 lb 12.8 oz (102.9 kg)    Physical Exam Vitals reviewed.  Constitutional:      General: She is not in acute distress.    Appearance: Normal appearance.  HENT:     Head: Normocephalic and atraumatic.     Right Ear: External ear normal.     Left Ear: External ear normal.  Eyes:     General: No scleral icterus.       Right eye: No discharge.        Left eye: No discharge.     Conjunctiva/sclera: Conjunctivae normal.  Neck:     Thyroid: No thyromegaly.  Cardiovascular:     Rate and Rhythm: Normal rate and regular rhythm.  Pulmonary:     Effort: No respiratory distress.     Breath sounds: Normal breath sounds. No wheezing.  Abdominal:     General: Bowel sounds are normal.     Palpations: Abdomen is soft.     Tenderness: There is no abdominal tenderness.  Musculoskeletal:        General: No swelling or tenderness.     Cervical back: Neck supple.  No tenderness.     Comments: Negative SLR.  No pain to palpation.  Motor strength - 5/5 bilateral lower extremities.   Lymphadenopathy:     Cervical: No cervical adenopathy.  Skin:  Findings: No erythema or rash.  Neurological:     Mental Status: She is alert.  Psychiatric:        Mood and Affect: Mood normal.        Behavior: Behavior normal.     Outpatient Encounter Medications as of 07/06/2021  Medication Sig   amLODipine (NORVASC) 5 MG tablet TAKE ONE TABLET BY MOUTH DAILY   atenolol (TENORMIN) 50 MG tablet TAKE ONE TABLET BY MOUTH DAILY   atorvastatin (LIPITOR) 40 MG tablet TAKE ONE TABLET BY MOUTH DAILY AT 6P.M.   FLUoxetine (PROZAC) 40 MG capsule TAKE ONE CAPSULE BY MOUTH DAILY   losartan-hydrochlorothiazide (HYZAAR) 100-12.5 MG tablet TAKE ONE TABLET BY MOUTH DAILY   No facility-administered encounter medications on file as of 07/06/2021.     Lab Results  Component Value Date   WBC 6.0 06/23/2021   HGB 13.3 06/23/2021   HCT 39.8 06/23/2021   PLT 235.0 06/23/2021   GLUCOSE 97 06/23/2021   CHOL 171 06/23/2021   TRIG 82.0 06/23/2021   HDL 82.80 06/23/2021   LDLCALC 72 06/23/2021   ALT 16 06/23/2021   AST 18 06/23/2021   NA 139 06/23/2021   K 4.4 06/23/2021   CL 105 06/23/2021   CREATININE 0.69 06/23/2021   BUN 26 (H) 06/23/2021   CO2 27 06/23/2021   TSH 4.08 06/23/2021   HGBA1C 5.5 06/23/2021       Assessment & Plan:   Problem List Items Addressed This Visit     Hyperglycemia    Low carb diet and exercise. Follow met b and a1c.  Lab Results  Component Value Date   HGBA1C 5.5 06/23/2021       Hyperlipidemia    On Crestor.  Low-cholesterol diet and exercise.  Follow lipid panel liver function test. Lab Results  Component Value Date   CHOL 171 06/23/2021   HDL 82.80 06/23/2021   LDLCALC 72 06/23/2021   TRIG 82.0 06/23/2021   CHOLHDL 2 06/23/2021       Hypertension    Continue losartan/hctz, amlodipine and atenolol.  Follow pressures.  Follow  metabolic panel.       Leg pain, bilateral    Bilateral leg aching as outlined.  Appears to be more related to muscle aching.  Discussed venous insufficiency.  Consider compression hose.  Check esr and crp - to confirm no acute inflammatory etiology. Also check ck.  May consider trial off statin to confirm not contributing.        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  Overall she feels she is doing better than previous check.  Does not feel needs any further intervention.  Follow.       Sleep apnea    Continue CPAP.        Einar Pheasant, MD

## 2021-07-07 LAB — C-REACTIVE PROTEIN: CRP: <1 mg/dL (ref 0.5–20.0)

## 2021-07-07 LAB — CK: Total CK: 48 U/L (ref 7–177)

## 2021-07-07 LAB — SEDIMENTATION RATE: Sed Rate: 3 mm/hr (ref 0–30)

## 2021-07-08 ENCOUNTER — Encounter: Payer: Self-pay | Admitting: Internal Medicine

## 2021-07-08 DIAGNOSIS — F320 Major depressive disorder, single episode, mild: Secondary | ICD-10-CM | POA: Insufficient documentation

## 2021-07-08 NOTE — Assessment & Plan Note (Signed)
Continue CPAP.  

## 2021-07-08 NOTE — Assessment & Plan Note (Signed)
Bilateral leg aching as outlined.  Appears to be more related to muscle aching.  Discussed venous insufficiency.  Consider compression hose.  Check esr and crp - to confirm no acute inflammatory etiology. Also check ck.  May consider trial off statin to confirm not contributing.

## 2021-07-08 NOTE — Assessment & Plan Note (Signed)
Discussed.  Overall she feels she is doing better than previous check.  Does not feel needs any further intervention.  Follow.

## 2021-07-08 NOTE — Assessment & Plan Note (Signed)
On Crestor.  Low-cholesterol diet and exercise.  Follow lipid panel liver function test. Lab Results  Component Value Date   CHOL 171 06/23/2021   HDL 82.80 06/23/2021   LDLCALC 72 06/23/2021   TRIG 82.0 06/23/2021   CHOLHDL 2 06/23/2021

## 2021-07-08 NOTE — Assessment & Plan Note (Signed)
Continue losartan/hctz, amlodipine and atenolol.  Follow pressures.  Follow metabolic panel.

## 2021-07-08 NOTE — Assessment & Plan Note (Signed)
Low carb diet and exercise. Follow met b and a1c.  Lab Results  Component Value Date   HGBA1C 5.5 06/23/2021

## 2021-07-09 ENCOUNTER — Encounter: Payer: Self-pay | Admitting: Internal Medicine

## 2021-07-26 ENCOUNTER — Other Ambulatory Visit: Payer: Self-pay

## 2021-07-26 ENCOUNTER — Ambulatory Visit (INDEPENDENT_AMBULATORY_CARE_PROVIDER_SITE_OTHER): Payer: Medicare Other | Admitting: Dermatology

## 2021-07-26 DIAGNOSIS — L570 Actinic keratosis: Secondary | ICD-10-CM | POA: Diagnosis not present

## 2021-07-26 DIAGNOSIS — B078 Other viral warts: Secondary | ICD-10-CM

## 2021-07-26 DIAGNOSIS — Z872 Personal history of diseases of the skin and subcutaneous tissue: Secondary | ICD-10-CM | POA: Diagnosis not present

## 2021-07-26 DIAGNOSIS — L578 Other skin changes due to chronic exposure to nonionizing radiation: Secondary | ICD-10-CM | POA: Diagnosis not present

## 2021-07-26 DIAGNOSIS — L820 Inflamed seborrheic keratosis: Secondary | ICD-10-CM

## 2021-07-26 NOTE — Progress Notes (Signed)
? ?Follow-Up Visit ?  ?Subjective  ?Sherry Davila is a 76 y.o. female who presents for the following: Skin Problem (7 months f/u ISK on the right cheek, pt had this area treated with LN2 01-14-2021) and Follow-up (Biopsy proven SOLAR LENTIGO, WITH SCATTERED ATYPICAL MELANOCYTES at right superior chest 01/14/2021 pt here for treatment). ?The patient has spots, moles and lesions to be evaluated, some may be new or changing and the patient has concerns that these could be cancer. ? ?The following portions of the chart were reviewed this encounter and updated as appropriate:  ? Tobacco  Allergies  Meds  Problems  Med Hx  Surg Hx  Fam Hx   ?  ?Review of Systems:  No other skin or systemic complaints except as noted in HPI or Assessment and Plan. ? ?Objective  ?Well appearing patient in no apparent distress; mood and affect are within normal limits. ? ?A focused examination was performed including face,chest, right ankle. Relevant physical exam findings are noted in the Assessment and Plan. ? ?right superior chest ?Pink patch  ? ?right anterior sole ?Clear skin  ? ?left nose lateral to supratip ?Clear ? ?right cheek ?Stuck-on, waxy, tan-brown papule ? ? ? ? ? ?Assessment & Plan  ?AK (actinic keratosis) ?right superior chest ? ?Biopsy proven SOLAR LENTIGO, WITH SCATTERED ATYPICAL MELANOCYTES  ? ?Actinic keratoses are precancerous spots that appear secondary to cumulative UV radiation exposure/sun exposure over time. They are chronic with expected duration over 1 year. A portion of actinic keratoses will progress to squamous cell carcinoma of the skin. It is not possible to reliably predict which spots will progress to skin cancer and so treatment is recommended to prevent development of skin cancer. ? ?Recommend daily broad spectrum sunscreen SPF 30+ to sun-exposed areas, reapply every 2 hours as needed.  ?Recommend staying in the shade or wearing long sleeves, sun glasses (UVA+UVB protection) and wide brim hats  (4-inch brim around the entire circumference of the hat). ?Call for new or changing lesions.  ? ?Destruction of lesion - right superior chest ?Complexity: simple   ?Destruction method: cryotherapy   ?Informed consent: discussed and consent obtained   ?Timeout:  patient name, date of birth, surgical site, and procedure verified ?Lesion destroyed using liquid nitrogen: Yes   ?Region frozen until ice ball extended beyond lesion: Yes   ?Outcome: patient tolerated procedure well with no complications   ?Post-procedure details: wound care instructions given   ? ?Viral wart - treated last visit ?right anterior ankle ?Clear. Observe for recurrence. Call clinic for new or changing lesions.  Recommend regular skin exams, daily broad-spectrum spf 30+ sunscreen use, and photoprotection.   ? ?Discussed viral etiology and risk of spread.  Discussed multiple treatments may be required to clear warts.  Discussed possible post-treatment dyspigmentation and risk of recurrence.  ? ?History of actinic keratoses ?left nose lateral to supratip ?Clear. Observe for recurrence. Call clinic for new or changing lesions.  Recommend regular skin exams, daily broad-spectrum spf 30+ sunscreen use, and photoprotection.   ?  ?Inflamed seborrheic keratosis ?right cheek ?Reassured benign age-related growth.  Recommend observation.  Discussed cryotherapy if spot(s) become irritated or inflamed.  ?Inflamed - persistent.  If not resolved on fu, retreat vs biopsy. ?Destruction of lesion - right cheek ?Complexity: simple   ?Destruction method: cryotherapy   ?Informed consent: discussed and consent obtained   ?Timeout:  patient name, date of birth, surgical site, and procedure verified ?Lesion destroyed using liquid nitrogen: Yes   ?  Region frozen until ice ball extended beyond lesion: Yes   ?Outcome: patient tolerated procedure well with no complications   ?Post-procedure details: wound care instructions given   ? ?Actinic Damage ?- chronic, secondary to  cumulative UV radiation exposure/sun exposure over time ?- diffuse scaly erythematous macules with underlying dyspigmentation ?- Recommend daily broad spectrum sunscreen SPF 30+ to sun-exposed areas, reapply every 2 hours as needed.  ?- Recommend staying in the shade or wearing long sleeves, sun glasses (UVA+UVB protection) and wide brim hats (4-inch brim around the entire circumference of the hat). ?- Call for new or changing lesions.  ? ?Return in 4 months (on 11/25/2021) for TBSE, ISK, AKs . ? ?I, Marye Round, CMA, am acting as scribe for Sarina Ser, MD .  ?Documentation: I have reviewed the above documentation for accuracy and completeness, and I agree with the above. ? ?Sarina Ser, MD ? ?

## 2021-07-26 NOTE — Patient Instructions (Addendum)

## 2021-07-27 ENCOUNTER — Encounter: Payer: Self-pay | Admitting: Internal Medicine

## 2021-07-27 ENCOUNTER — Encounter: Payer: Self-pay | Admitting: Dermatology

## 2021-07-29 NOTE — Telephone Encounter (Signed)
Patient returned office phone call, appointment scheduled. ?

## 2021-07-29 NOTE — Telephone Encounter (Signed)
LM to schedule appt. Please schedule patient when she returns call. ?

## 2021-08-04 ENCOUNTER — Other Ambulatory Visit: Payer: Self-pay | Admitting: Internal Medicine

## 2021-08-04 DIAGNOSIS — I10 Essential (primary) hypertension: Secondary | ICD-10-CM

## 2021-08-12 ENCOUNTER — Ambulatory Visit (INDEPENDENT_AMBULATORY_CARE_PROVIDER_SITE_OTHER): Payer: Medicare Other | Admitting: Internal Medicine

## 2021-08-12 ENCOUNTER — Other Ambulatory Visit: Payer: Self-pay

## 2021-08-12 ENCOUNTER — Encounter: Payer: Self-pay | Admitting: Internal Medicine

## 2021-08-12 VITALS — BP 128/78 | HR 63 | Temp 98.6°F | Ht 66.0 in | Wt 226.0 lb

## 2021-08-12 DIAGNOSIS — F320 Major depressive disorder, single episode, mild: Secondary | ICD-10-CM | POA: Diagnosis not present

## 2021-08-12 DIAGNOSIS — E785 Hyperlipidemia, unspecified: Secondary | ICD-10-CM | POA: Diagnosis not present

## 2021-08-12 DIAGNOSIS — M5442 Lumbago with sciatica, left side: Secondary | ICD-10-CM

## 2021-08-12 DIAGNOSIS — I10 Essential (primary) hypertension: Secondary | ICD-10-CM | POA: Diagnosis not present

## 2021-08-12 DIAGNOSIS — D692 Other nonthrombocytopenic purpura: Secondary | ICD-10-CM

## 2021-08-12 DIAGNOSIS — E669 Obesity, unspecified: Secondary | ICD-10-CM | POA: Diagnosis not present

## 2021-08-12 DIAGNOSIS — G4730 Sleep apnea, unspecified: Secondary | ICD-10-CM

## 2021-08-12 DIAGNOSIS — M25552 Pain in left hip: Secondary | ICD-10-CM

## 2021-08-12 DIAGNOSIS — R739 Hyperglycemia, unspecified: Secondary | ICD-10-CM | POA: Diagnosis not present

## 2021-08-12 NOTE — Progress Notes (Signed)
Patient ID: Sherry Davila, female   DOB: 1945/11/10, 76 y.o.   MRN: [id number] ? ? ?Subjective:  ? ? Patient ID: Sherry Davila, female    DOB: 1946-05-20, 76 y.o.   MRN: [id number] ? ?This visit occurred during the SARS-CoV-2 public health emergency.  Safety protocols were in place, including screening questions prior to the visit, additional usage of staff PPE, and extensive cleaning of exam room while observing appropriate contact time as indicated for disinfecting solutions.  ? ?Patient here for a scheduled follow up.  .  ? ?HPI ?Here to follow up regarding persistent leg pain and wanted to discuss weight loss.  Recently had developed bilateral (diffuse) leg aching.  Previous labs (including esr, ck) were unrevealing.  Statin stopped.  She has continued to have problems.  The diffuse aching/pain is no longer present.  What she is describing now is pain that appears more localized to low back (more left) and left hip and into left upper leg.  Describes the discomfort and also describes that the leg feels weak.  She did have an episode within the last week where the right leg thigh muscle felt like it was contracting and releasing.  This has not occurred since.  Denies any increased pain in the right leg. Had a previous episode where she felt some discomfort in her upper arms, but otherwise no upper body pain.  No headache. No neck pain.  No rash. No fever. Off statin.  Above is limiting her regarding activity. She did walk a mile a few days ago, and had increased discomfort in her leg while standing cooking that evening.  No chest pain or sob reported.  No abdominal pain.  Bowels moving.  Concerned regarding her weight.  Discussed diet, exercise and medication options.  ? ? ?Past Medical History:  ?Diagnosis Date  ? Actinic keratosis 04/04/2018  ? L nose lat to supra tip  ? Anxiety   ? Basal cell carcinoma 04/04/2018  ? R prox nasal ala  ? Depression   ? Dysplastic nevus 05/07/2013  ? R abdomen - mod to  severe, excision 05/29/2013  ? Hyperlipidemia   ? Hypertension   ? Obesity   ? Sleep apnea   ? has a cpap, doesn't wear   ? Varicose vein   ? ?Past Surgical History:  ?Procedure Laterality Date  ? COLONOSCOPY WITH PROPOFOL N/A 09/08/2016  ? Procedure: COLONOSCOPY WITH PROPOFOL;  Surgeon: Jonathon Bellows, MD;  Location: [facility] ENDOSCOPY;  Service: Endoscopy;  Laterality: N/A;  ? COLONOSCOPY WITH PROPOFOL N/A 01/07/2020  ? Procedure: COLONOSCOPY WITH PROPOFOL;  Surgeon: Jonathon Bellows, MD;  Location: [facility] ENDOSCOPY;  Service: Gastroenterology;  Laterality: N/A;  ? FRACTURE SURGERY    ? foot, screws placed  ? JOINT REPLACEMENT Right 2009  ? knee  ? ?Family History  ?Problem Relation Age of Onset  ? Cancer Mother   ? Heart failure Mother   ? Heart disease Mother   ? Breast cancer Mother 60  ? Heart attack Mother   ? AAA (abdominal aortic aneurysm) Father   ? Hypertension Father   ? Heart disease Father   ? Hypertension Sister   ? ?Social History  ? ?Socioeconomic History  ? Marital status: Married  ?  Spouse name: Not on file  ? Number of children: Not on file  ? Years of education: Not on file  ? Highest education level: Not on file  ?Occupational History  ? Not on file  ?Tobacco Use  ?  Smoking status: Former  ?  Types: Cigarettes  ?  Quit date: 01/19/1988  ?  Years since quitting: 33.5  ? Smokeless tobacco: Never  ?Vaping Use  ? Vaping Use: Never used  ?Substance and Sexual Activity  ? Alcohol use: Yes  ?  Alcohol/week: 3.0 standard drinks  ?  Types: 3 Shots of liquor per week  ?  Comment: 3 drinks a week socially   ? Drug use: No  ? Sexual activity: Not on file  ?Other Topics Concern  ? Not on file  ?Social History Narrative  ? Not on file  ? ?Social Determinants of Health  ? ?Financial Resource Strain: Low Risk   ? Difficulty of Paying Living Expenses: Not hard at all  ?Food Insecurity: No Food Insecurity  ? Worried About Charity fundraiser in the Last Year: Never true  ? Ran Out of Food in the Last Year: Never true   ?Transportation Needs: No Transportation Needs  ? Lack of Transportation (Medical): No  ? Lack of Transportation (Non-Medical): No  ?Physical Activity: Insufficiently Active  ? Days of Exercise per Week: 3 days  ? Minutes of Exercise per Session: 30 min  ?Stress: No Stress Concern Present  ? Feeling of Stress : Not at all  ?Social Connections: Moderately Integrated  ? Frequency of Communication with Friends and Family: Never  ? Frequency of Social Gatherings with Friends and Family: More than three times a week  ? Attends Religious Services: More than 4 times per year  ? Active Member of Clubs or Organizations: No  ? Attends Archivist Meetings: Never  ? Marital Status: Married  ? ? ? ?Review of Systems  ?Constitutional:  Negative for appetite change and unexpected weight change.  ?HENT:  Negative for congestion and sinus pressure.   ?Respiratory:  Negative for cough and chest tightness.   ?     No increased sob.   ?Cardiovascular:  Negative for chest pain, palpitations and leg swelling.  ?Gastrointestinal:  Negative for abdominal pain, diarrhea, nausea and vomiting.  ?Genitourinary:  Negative for difficulty urinating and dysuria.  ?Musculoskeletal:  Negative for joint swelling and myalgias.  ?Skin:  Negative for color change and rash.  ?Neurological:  Negative for dizziness, light-headedness and headaches.  ?Psychiatric/Behavioral:  Negative for agitation and dysphoric mood.   ? ?   ?Objective:  ?  ? ?BP 128/78 (BP Location: Left Arm, Patient Position: Sitting, Cuff Size: Normal)   Pulse 63   Temp 98.6 ?F (37 ?C) (Oral)   Ht '5\' 6"'  (1.676 m)   Wt 226 lb (102.5 kg)   SpO2 98%   BMI 36.48 kg/m?  ?Wt Readings from Last 3 Encounters:  ?08/12/21 226 lb (102.5 kg)  ?07/06/21 226 lb 3.2 oz (102.6 kg)  ?04/01/21 228 lb (103.4 kg)  ? ? ?Physical Exam ?Vitals reviewed.  ?Constitutional:   ?   General: She is not in acute distress. ?   Appearance: Normal appearance.  ?HENT:  ?   Head: Normocephalic and  atraumatic.  ?   Right Ear: External ear normal.  ?   Left Ear: External ear normal.  ?Eyes:  ?   General: No scleral icterus.    ?   Right eye: No discharge.     ?   Left eye: No discharge.  ?   Conjunctiva/sclera: Conjunctivae normal.  ?Neck:  ?   Thyroid: No thyromegaly.  ?Cardiovascular:  ?   Rate and Rhythm: Normal rate and regular rhythm.  ?  Pulmonary:  ?   Effort: No respiratory distress.  ?   Breath sounds: Normal breath sounds. No wheezing.  ?Abdominal:  ?   General: Bowel sounds are normal.  ?   Palpations: Abdomen is soft.  ?   Tenderness: There is no abdominal tenderness.  ?Musculoskeletal:     ?   General: No swelling or tenderness.  ?   Cervical back: Neck supple. No tenderness.  ?   Comments: Increased pain with standing and lifting/marching - pain in left lateral hip and left upper thigh.  No pain in groin with abduction/adduction of lower leg.  Motor strength appears to be normal bilateral lower legs, but increased discomfort with testing.  Difficulty lifting left leg off exam table - due to increased pain.   ?Lymphadenopathy:  ?   Cervical: No cervical adenopathy.  ?Skin: ?   Findings: No erythema or rash.  ?Neurological:  ?   Mental Status: She is alert.  ?Psychiatric:     ?   Mood and Affect: Mood normal.     ?   Behavior: Behavior normal.  ? ? ? ?Outpatient Encounter Medications as of 08/12/2021  ?Medication Sig  ? amLODipine (NORVASC) 5 MG tablet TAKE ONE TABLET BY MOUTH DAILY  ? atenolol (TENORMIN) 50 MG tablet TAKE ONE TABLET BY MOUTH DAILY  ? FLUoxetine (PROZAC) 40 MG capsule TAKE ONE CAPSULE BY MOUTH DAILY  ? losartan-hydrochlorothiazide (HYZAAR) 100-12.5 MG tablet TAKE ONE TABLET BY MOUTH DAILY  ? [DISCONTINUED] atorvastatin (LIPITOR) 40 MG tablet TAKE ONE TABLET BY MOUTH DAILY AT 6P.M.  ? ?No facility-administered encounter medications on file as of 08/12/2021.  ?  ? ?Lab Results  ?Component Value Date  ? WBC 6.0 06/23/2021  ? HGB 13.3 06/23/2021  ? HCT 39.8 06/23/2021  ? PLT 235.0  06/23/2021  ? GLUCOSE 97 06/23/2021  ? CHOL 171 06/23/2021  ? TRIG 82.0 06/23/2021  ? HDL 82.80 06/23/2021  ? [place] 72 06/23/2021  ? ALT 16 06/23/2021  ? AST 18 06/23/2021  ? NA 139 06/23/2021  ? K 4.4 06/23/2021  ? CL 105 06/23/2021

## 2021-08-13 ENCOUNTER — Encounter: Payer: Self-pay | Admitting: Internal Medicine

## 2021-08-13 ENCOUNTER — Telehealth: Payer: Self-pay | Admitting: Internal Medicine

## 2021-08-13 DIAGNOSIS — M545 Low back pain, unspecified: Secondary | ICD-10-CM | POA: Insufficient documentation

## 2021-08-13 DIAGNOSIS — D692 Other nonthrombocytopenic purpura: Secondary | ICD-10-CM | POA: Insufficient documentation

## 2021-08-13 NOTE — Assessment & Plan Note (Signed)
Per review has seen Dr Kowalski.  Continue f/u with dermatology.  

## 2021-08-13 NOTE — Telephone Encounter (Signed)
LMTCB

## 2021-08-13 NOTE — Telephone Encounter (Signed)
I saw Sherry Davila 08/12/21.  Notify her that after reviewing her chart, I would like to go ahead and obtain labs and xray.  Labs and xray are ordered. Please schedule non fasting lab appt and xray.  Will have this information and then can decide regarding referral, etc.   ?

## 2021-08-13 NOTE — Assessment & Plan Note (Signed)
Off cholesterol medication currently.  Low cholesterol diet and exercise.  Will remain off until can get above issues sorted through.  Follow lipid panel.  ?

## 2021-08-13 NOTE — Assessment & Plan Note (Signed)
Does report some low back pain as outlined.  Previous hip xray with arthritis changes.  Discussed xray L-S spine.   ?

## 2021-08-13 NOTE — Assessment & Plan Note (Signed)
Discussed diet and exercise.  Discussed rx medication.  Will hold on starting new medication until can sort through some of her acute issues.  Diet and exercise.  Follow.  ?

## 2021-08-13 NOTE — Assessment & Plan Note (Signed)
Continue losartan/hctz, amlodipine and atenolol.  Follow pressures.  Follow metabolic panel.  ?

## 2021-08-13 NOTE — Assessment & Plan Note (Signed)
CPAP.  

## 2021-08-13 NOTE — Assessment & Plan Note (Signed)
Pain as outlined.  Appears to be more localized to left lower back/left hip and upper thigh.  It appears more limited by pain rather than weakness.  Strength appears to be ok on exam.  Does not feel needs pain medication.  Off statin.  Recent labs, including esr and ck ok.  Consider repeat to confirm.  Discussed ortho evaluation.   ?

## 2021-08-13 NOTE — Assessment & Plan Note (Signed)
Low carb diet and exercise. Follow met b and a1c.  ?Lab Results  ?Component Value Date  ? HGBA1C 5.5 06/23/2021  ? ?

## 2021-08-13 NOTE — Assessment & Plan Note (Signed)
Continues on prozac.  Overall she feels she is doing ok.  Follow.  ?

## 2021-08-16 ENCOUNTER — Telehealth: Payer: Self-pay

## 2021-08-16 ENCOUNTER — Other Ambulatory Visit (INDEPENDENT_AMBULATORY_CARE_PROVIDER_SITE_OTHER): Payer: Medicare Other

## 2021-08-16 ENCOUNTER — Other Ambulatory Visit: Payer: Self-pay

## 2021-08-16 ENCOUNTER — Ambulatory Visit (INDEPENDENT_AMBULATORY_CARE_PROVIDER_SITE_OTHER): Payer: Medicare Other

## 2021-08-16 DIAGNOSIS — I10 Essential (primary) hypertension: Secondary | ICD-10-CM

## 2021-08-16 DIAGNOSIS — M4316 Spondylolisthesis, lumbar region: Secondary | ICD-10-CM | POA: Diagnosis not present

## 2021-08-16 DIAGNOSIS — M545 Low back pain, unspecified: Secondary | ICD-10-CM | POA: Diagnosis not present

## 2021-08-16 DIAGNOSIS — E785 Hyperlipidemia, unspecified: Secondary | ICD-10-CM | POA: Diagnosis not present

## 2021-08-16 DIAGNOSIS — M48061 Spinal stenosis, lumbar region without neurogenic claudication: Secondary | ICD-10-CM | POA: Diagnosis not present

## 2021-08-16 DIAGNOSIS — M25552 Pain in left hip: Secondary | ICD-10-CM | POA: Diagnosis not present

## 2021-08-16 DIAGNOSIS — M4807 Spinal stenosis, lumbosacral region: Secondary | ICD-10-CM | POA: Diagnosis not present

## 2021-08-16 DIAGNOSIS — M5442 Lumbago with sciatica, left side: Secondary | ICD-10-CM

## 2021-08-16 NOTE — Telephone Encounter (Signed)
Noted.  Hold until scheduled.  Thanks.  ?

## 2021-08-16 NOTE — Telephone Encounter (Signed)
Lvm for pt to return call to scheduled nonfasting lab appt and X-ray. ?

## 2021-08-17 LAB — CBC WITH DIFFERENTIAL/PLATELET
Basophils Absolute: 0.1 10*3/uL (ref 0.0–0.1)
Basophils Relative: 1.2 % (ref 0.0–3.0)
Eosinophils Absolute: 0.4 10*3/uL (ref 0.0–0.7)
Eosinophils Relative: 7.3 % — ABNORMAL HIGH (ref 0.0–5.0)
HCT: 41.6 % (ref 36.0–46.0)
Hemoglobin: 13.9 g/dL (ref 12.0–15.0)
Lymphocytes Relative: 28.2 % (ref 12.0–46.0)
Lymphs Abs: 1.7 10*3/uL (ref 0.7–4.0)
MCHC: 33.5 g/dL (ref 30.0–36.0)
MCV: 92.4 fl (ref 78.0–100.0)
Monocytes Absolute: 0.7 10*3/uL (ref 0.1–1.0)
Monocytes Relative: 11.4 % (ref 3.0–12.0)
Neutro Abs: 3.1 10*3/uL (ref 1.4–7.7)
Neutrophils Relative %: 51.9 % (ref 43.0–77.0)
Platelets: 258 10*3/uL (ref 150.0–400.0)
RBC: 4.5 Mil/uL (ref 3.87–5.11)
RDW: 13.8 % (ref 11.5–15.5)
WBC: 5.9 10*3/uL (ref 4.0–10.5)

## 2021-08-17 LAB — BASIC METABOLIC PANEL
BUN: 23 mg/dL (ref 6–23)
CO2: 26 mEq/L (ref 19–32)
Calcium: 9.3 mg/dL (ref 8.4–10.5)
Chloride: 104 mEq/L (ref 96–112)
Creatinine, Ser: 0.64 mg/dL (ref 0.40–1.20)
GFR: 87.13 mL/min (ref >60.00)
Glucose, Bld: 101 mg/dL — ABNORMAL HIGH (ref 70–99)
Potassium: 3.9 mEq/L (ref 3.5–5.1)
Sodium: 140 mEq/L (ref 135–145)

## 2021-08-17 LAB — SEDIMENTATION RATE: Sed Rate: 6 mm/hr (ref 0–30)

## 2021-08-17 LAB — C-REACTIVE PROTEIN: CRP: <1 mg/dL (ref 0.5–20.0)

## 2021-08-18 LAB — ALDOLASE: Aldolase: 4.2 U/L (ref <=8.1)

## 2021-08-18 NOTE — Telephone Encounter (Signed)
LMTCB

## 2021-08-19 ENCOUNTER — Telehealth: Payer: Self-pay | Admitting: Internal Medicine

## 2021-08-19 NOTE — Telephone Encounter (Signed)
Patient called about her xray results. 

## 2021-08-19 NOTE — Telephone Encounter (Signed)
See result note.  

## 2021-08-20 ENCOUNTER — Other Ambulatory Visit: Payer: Self-pay

## 2021-08-20 DIAGNOSIS — M5442 Lumbago with sciatica, left side: Secondary | ICD-10-CM

## 2021-09-06 ENCOUNTER — Encounter: Payer: Self-pay | Admitting: Internal Medicine

## 2021-09-06 ENCOUNTER — Ambulatory Visit (INDEPENDENT_AMBULATORY_CARE_PROVIDER_SITE_OTHER): Payer: Medicare Other | Admitting: Internal Medicine

## 2021-09-06 VITALS — BP 130/70 | HR 60 | Temp 98.0°F | Resp 14 | Ht 66.0 in | Wt 231.2 lb

## 2021-09-06 DIAGNOSIS — G4730 Sleep apnea, unspecified: Secondary | ICD-10-CM

## 2021-09-06 DIAGNOSIS — E785 Hyperlipidemia, unspecified: Secondary | ICD-10-CM

## 2021-09-06 DIAGNOSIS — F320 Major depressive disorder, single episode, mild: Secondary | ICD-10-CM

## 2021-09-06 DIAGNOSIS — M25552 Pain in left hip: Secondary | ICD-10-CM | POA: Diagnosis not present

## 2021-09-06 DIAGNOSIS — R739 Hyperglycemia, unspecified: Secondary | ICD-10-CM | POA: Diagnosis not present

## 2021-09-06 DIAGNOSIS — E669 Obesity, unspecified: Secondary | ICD-10-CM

## 2021-09-06 DIAGNOSIS — Z0000 Encounter for general adult medical examination without abnormal findings: Secondary | ICD-10-CM | POA: Diagnosis not present

## 2021-09-06 DIAGNOSIS — I10 Essential (primary) hypertension: Secondary | ICD-10-CM | POA: Diagnosis not present

## 2021-09-06 DIAGNOSIS — M5442 Lumbago with sciatica, left side: Secondary | ICD-10-CM | POA: Diagnosis not present

## 2021-09-06 NOTE — Progress Notes (Addendum)
Patient ID: Sherry Davila, female   DOB: 08/10/1945, 76 y.o.   MRN: [id number] ? ? ?Subjective:  ? ? Patient ID: Sherry Davila, female    DOB: Apr 14, 1946, 76 y.o.   MRN: [id number] ? ?This visit occurred during the SARS-CoV-2 public health emergency.  Safety protocols were in place, including screening questions prior to the visit, additional usage of staff PPE, and extensive cleaning of exam room while observing appropriate contact time as indicated for disinfecting solutions.  ? ?Patient here for her physical exam.  ? ?Chief Complaint  ?Patient presents with  ? Follow-up  ?  CPE - pt reports feeling well other than back pain. Seeing orthopedic surgeon tomorrow.  ? .  ? ?HPI ?Persistent back and leg pain - left low back, anterior thigh and left groin.  Worse with weight bearing and walking.  The previous leg aching has improved.  Still issues with walking - lifting left leg.  Off statin medication.  Labs unrevealing.  Due to see ortho tomorrow.  Discussed weight loss.  Discussed diet and exercise.  Desires to be referred to the weight loss clinic.  No chest pain or sob reported.  No abdominal pain or bowel change reported.  Doing better - with increased stress, etc.  ? ? ?Past Medical History:  ?Diagnosis Date  ? Actinic keratosis 04/04/2018  ? L nose lat to supra tip  ? Anxiety   ? Basal cell carcinoma 04/04/2018  ? R prox nasal ala  ? Depression   ? Dysplastic nevus 05/07/2013  ? R abdomen - mod to severe, excision 05/29/2013  ? Hyperlipidemia   ? Hypertension   ? Obesity   ? Sleep apnea   ? has a cpap, doesn't wear   ? Varicose vein   ? ?Past Surgical History:  ?Procedure Laterality Date  ? COLONOSCOPY WITH PROPOFOL N/A 09/08/2016  ? Procedure: COLONOSCOPY WITH PROPOFOL;  Surgeon: Jonathon Bellows, MD;  Location: [facility] ENDOSCOPY;  Service: Endoscopy;  Laterality: N/A;  ? COLONOSCOPY WITH PROPOFOL N/A 01/07/2020  ? Procedure: COLONOSCOPY WITH PROPOFOL;  Surgeon: Jonathon Bellows, MD;  Location: [facility] ENDOSCOPY;  Service:  Gastroenterology;  Laterality: N/A;  ? FRACTURE SURGERY    ? foot, screws placed  ? JOINT REPLACEMENT Right 2009  ? knee  ? ?Family History  ?Problem Relation Age of Onset  ? Cancer Mother   ? Heart failure Mother   ? Heart disease Mother   ? Breast cancer Mother 70  ? Heart attack Mother   ? AAA (abdominal aortic aneurysm) Father   ? Hypertension Father   ? Heart disease Father   ? Hypertension Sister   ? ?Social History  ? ?Socioeconomic History  ? Marital status: Married  ?  Spouse name: Not on file  ? Number of children: Not on file  ? Years of education: Not on file  ? Highest education level: Not on file  ?Occupational History  ? Not on file  ?Tobacco Use  ? Smoking status: Former  ?  Types: Cigarettes  ?  Quit date: 01/19/1988  ?  Years since quitting: 33.6  ? Smokeless tobacco: Never  ?Vaping Use  ? Vaping Use: Never used  ?Substance and Sexual Activity  ? Alcohol use: Yes  ?  Alcohol/week: 3.0 standard drinks  ?  Types: 3 Shots of liquor per week  ?  Comment: 3 drinks a week socially   ? Drug use: No  ? Sexual activity: Not on file  ?Other Topics Concern  ?  Not on file  ?Social History Narrative  ? Not on file  ? ?Social Determinants of Health  ? ?Financial Resource Strain: Low Risk   ? Difficulty of Paying Living Expenses: Not hard at all  ?Food Insecurity: No Food Insecurity  ? Worried About Charity fundraiser in the Last Year: Never true  ? Ran Out of Food in the Last Year: Never true  ?Transportation Needs: No Transportation Needs  ? Lack of Transportation (Medical): No  ? Lack of Transportation (Non-Medical): No  ?Physical Activity: Insufficiently Active  ? Days of Exercise per Week: 3 days  ? Minutes of Exercise per Session: 30 min  ?Stress: No Stress Concern Present  ? Feeling of Stress : Not at all  ?Social Connections: Moderately Integrated  ? Frequency of Communication with Friends and Family: Never  ? Frequency of Social Gatherings with Friends and Family: More than three times a week  ? Attends  Religious Services: More than 4 times per year  ? Active Member of Clubs or Organizations: No  ? Attends Archivist Meetings: Never  ? Marital Status: Married  ? ? ? ?Review of Systems  ?Constitutional:  Negative for appetite change and unexpected weight change.  ?HENT:  Negative for congestion, sinus pressure and sore throat.   ?Eyes:  Negative for pain and visual disturbance.  ?Respiratory:  Negative for cough, chest tightness and shortness of breath.   ?Cardiovascular:  Negative for chest pain, palpitations and leg swelling.  ?Gastrointestinal:  Negative for abdominal pain, diarrhea, nausea and vomiting.  ?Genitourinary:  Negative for difficulty urinating and dysuria.  ?Musculoskeletal:  Positive for back pain. Negative for joint swelling and myalgias.  ?Skin:  Negative for color change and rash.  ?Neurological:  Negative for dizziness, light-headedness and headaches.  ?Hematological:  Negative for adenopathy. Does not bruise/bleed easily.  ?Psychiatric/Behavioral:  Negative for agitation and dysphoric mood.   ? ?   ?Objective:  ?  ? ?BP 130/70 (BP Location: Left Arm, Patient Position: Sitting, Cuff Size: Large)   Pulse 60   Temp 98 ?F (36.7 ?C) (Temporal)   Resp 14   Ht '5\' 6"'$  (1.676 m)   Wt 231 lb 3.2 oz (104.9 kg)   SpO2 96%   BMI 37.32 kg/m?  ?Wt Readings from Last 3 Encounters:  ?09/06/21 231 lb 3.2 oz (104.9 kg)  ?08/12/21 226 lb (102.5 kg)  ?07/06/21 226 lb 3.2 oz (102.6 kg)  ? ? ?Physical Exam ?Vitals reviewed.  ?Constitutional:   ?   General: She is not in acute distress. ?   Appearance: Normal appearance. She is well-developed.  ?HENT:  ?   Head: Normocephalic and atraumatic.  ?   Right Ear: External ear normal.  ?   Left Ear: External ear normal.  ?Eyes:  ?   General: No scleral icterus.    ?   Right eye: No discharge.     ?   Left eye: No discharge.  ?   Conjunctiva/sclera: Conjunctivae normal.  ?Neck:  ?   Thyroid: No thyromegaly.  ?Cardiovascular:  ?   Rate and Rhythm: Normal rate  and regular rhythm.  ?Pulmonary:  ?   Effort: No tachypnea, accessory muscle usage or respiratory distress.  ?   Breath sounds: Normal breath sounds. No decreased breath sounds or wheezing.  ?Chest:  ?Breasts: ?   Right: No inverted nipple, mass, nipple discharge or tenderness (no axillary adenopathy).  ?   Left: No inverted nipple, mass, nipple discharge or tenderness (  no axilarry adenopathy).  ?Abdominal:  ?   General: Bowel sounds are normal.  ?   Palpations: Abdomen is soft.  ?   Tenderness: There is no abdominal tenderness.  ?Musculoskeletal:     ?   General: No swelling or tenderness.  ?   Cervical back: Neck supple. No tenderness.  ?Lymphadenopathy:  ?   Cervical: No cervical adenopathy.  ?Skin: ?   Findings: No erythema or rash.  ?Neurological:  ?   Mental Status: She is alert and oriented to person, place, and time.  ?Psychiatric:     ?   Mood and Affect: Mood normal.     ?   Behavior: Behavior normal.  ? ? ? ?Outpatient Encounter Medications as of 09/06/2021  ?Medication Sig  ? amLODipine (NORVASC) 5 MG tablet TAKE ONE TABLET BY MOUTH DAILY  ? atenolol (TENORMIN) 50 MG tablet TAKE ONE TABLET BY MOUTH DAILY  ? FLUoxetine (PROZAC) 40 MG capsule TAKE ONE CAPSULE BY MOUTH DAILY  ? losartan-hydrochlorothiazide (HYZAAR) 100-12.5 MG tablet TAKE ONE TABLET BY MOUTH DAILY  ? ?No facility-administered encounter medications on file as of 09/06/2021.  ?  ? ?Lab Results  ?Component Value Date  ? WBC 5.9 08/16/2021  ? HGB 13.9 08/16/2021  ? HCT 41.6 08/16/2021  ? PLT 258.0 08/16/2021  ? GLUCOSE 101 (H) 08/16/2021  ? CHOL 171 06/23/2021  ? TRIG 82.0 06/23/2021  ? HDL 82.80 06/23/2021  ? [place] 72 06/23/2021  ? ALT 16 06/23/2021  ? AST 18 06/23/2021  ? NA 140 08/16/2021  ? K 3.9 08/16/2021  ? CL 104 08/16/2021  ? CREATININE 0.64 08/16/2021  ? BUN 23 08/16/2021  ? CO2 26 08/16/2021  ? TSH 4.08 06/23/2021  ? HGBA1C 5.5 06/23/2021  ? ? ?   ?Assessment & Plan:  ? ?Problem List Items Addressed This Visit   ? ? Depression,  major, single episode, mild ([place])  ?  Continues on prozac.  Overall she feels she is doing ok.  Appears to be doing better. Follow.  ? ?  ?  ? Healthcare maintenance  ?  Physical today 09/06/21.  Mammogram scheduled

## 2021-09-06 NOTE — Assessment & Plan Note (Signed)
Physical today 09/06/21.  Mammogram scheduled for 10/14/21.  colonosocpy 12/2019.  ?

## 2021-09-07 DIAGNOSIS — M545 Low back pain, unspecified: Secondary | ICD-10-CM | POA: Diagnosis not present

## 2021-09-07 DIAGNOSIS — M1612 Unilateral primary osteoarthritis, left hip: Secondary | ICD-10-CM | POA: Diagnosis not present

## 2021-09-07 DIAGNOSIS — M47816 Spondylosis without myelopathy or radiculopathy, lumbar region: Secondary | ICD-10-CM | POA: Diagnosis not present

## 2021-09-12 ENCOUNTER — Encounter: Payer: Self-pay | Admitting: Internal Medicine

## 2021-09-12 NOTE — Addendum Note (Signed)
Addended by: Alisa Graff on: 09/12/2021 10:41 AM ? ? Modules accepted: Orders ? ?

## 2021-09-12 NOTE — Assessment & Plan Note (Signed)
CPAP.  

## 2021-09-12 NOTE — Assessment & Plan Note (Signed)
Discussed diet and exercise.  Request referral to weight loss clinic.   Follow.  ?

## 2021-09-12 NOTE — Assessment & Plan Note (Signed)
Continue losartan/hctz, amlodipine and atenolol.  Follow pressures.  Follow metabolic panel.  ?

## 2021-09-12 NOTE — Assessment & Plan Note (Signed)
Persistent pain as outlined.  Low back/hip and groin.  Off statin medication.  Has tried conservative measures.  Due to see ortho tomorrow.  ?

## 2021-09-12 NOTE — Assessment & Plan Note (Signed)
Off cholesterol medication currently.  Low cholesterol diet and exercise. Has been off while sorting through her msk issues.   Follow lipid panel.  ?

## 2021-09-12 NOTE — Assessment & Plan Note (Signed)
Low back pain and left hip pain as outlined.  Due to see ortho tomorrow.   ?

## 2021-09-12 NOTE — Assessment & Plan Note (Signed)
Low carb diet and exercise. Follow met b and a1c.  ?Lab Results  ?Component Value Date  ? HGBA1C 5.5 06/23/2021  ? ?

## 2021-09-12 NOTE — Assessment & Plan Note (Signed)
Continues on prozac.  Overall she feels she is doing ok.  Appears to be doing better. Follow.  

## 2021-10-11 ENCOUNTER — Ambulatory Visit (INDEPENDENT_AMBULATORY_CARE_PROVIDER_SITE_OTHER): Payer: Medicare Other

## 2021-10-11 VITALS — Ht 66.0 in | Wt 231.0 lb

## 2021-10-11 DIAGNOSIS — Z0000 Encounter for general adult medical examination without abnormal findings: Secondary | ICD-10-CM

## 2021-10-11 NOTE — Patient Instructions (Addendum)
  Sherry Davila , Thank you for taking time to come for your Medicare Wellness Visit. I appreciate your ongoing commitment to your health goals. Please review the following plan we discussed and let me know if I can assist you in the future.   These are the goals we discussed:  Goals       Patient Stated     Weight goal 150lb (pt-stated)      Exercise at the gym, stay active Healthy diet        This is a list of the screening recommended for you and due dates:  Health Maintenance  Topic Date Due   Mammogram  09/29/2021   Zoster (Shingles) Vaccine (1 of 2) 01/11/2022*   Pneumonia Vaccine (2 - PPSV23 if available, else PCV20) 08/13/2022*   Flu Shot  12/21/2021   Colon Cancer Screening  01/07/2023   Tetanus Vaccine  12/09/2026   DEXA scan (bone density measurement)  Completed   COVID-19 Vaccine  Completed   Hepatitis C Screening: USPSTF Recommendation to screen - Ages 56-79 yo.  Completed   HPV Vaccine  Aged Out  *Topic was postponed. The date shown is not the original due date.

## 2021-10-11 NOTE — Progress Notes (Signed)
Subjective:   Liba Hulsey is a 76 y.o. female who presents for Medicare Annual (Subsequent) preventive examination.  Review of Systems    No ROS.  Medicare Wellness Virtual Visit.  Visual/audio telehealth visit, UTA vital signs.   See social history for additional risk factors.   Cardiac Risk Factors include: advanced age (>69mn, >>57women)     Objective:    Today's Vitals   10/11/21 1403  Weight: 231 lb (104.8 kg)  Height: '5\' 6"'$  (1.676 m)   Body mass index is 37.28 kg/m.     10/11/2021    2:09 PM 10/09/2020    1:30 PM 01/07/2020    7:33 AM 10/09/2019   11:32 AM 08/17/2017   10:51 AM 09/08/2016   10:46 AM 02/05/2015    1:09 PM  Advanced Directives  Does Patient Have a Medical Advance Directive? No No Yes Yes No Yes No;Yes  Type of ATheatre stage managerof [place] will  Living will [place]  Does patient want to make changes to medical advance directive?    No - Patient declined     Copy of [place] Chart?    No - copy requested     Would patient like information on creating a medical advance directive? No - Patient declined No - Patient declined   Yes (MAU/Ambulatory/Procedural Areas - Information given)  Yes - Educational materials given    Current Medications (verified) Outpatient Encounter Medications as of 10/11/2021  Medication Sig   amLODipine (NORVASC) 5 MG tablet TAKE ONE TABLET BY MOUTH DAILY   atenolol (TENORMIN) 50 MG tablet TAKE ONE TABLET BY MOUTH DAILY   FLUoxetine (PROZAC) 40 MG capsule TAKE ONE CAPSULE BY MOUTH DAILY   losartan-hydrochlorothiazide (HYZAAR) 100-12.5 MG tablet TAKE ONE TABLET BY MOUTH DAILY   No facility-administered encounter medications on file as of 10/11/2021.    Allergies (verified) Patient has no known allergies.   History: Past Medical History:  Diagnosis Date   Actinic keratosis 04/04/2018   L nose lat to supra tip   Anxiety    Basal cell  carcinoma 04/04/2018   R prox nasal ala   Depression    Dysplastic nevus 05/07/2013   R abdomen - mod to severe, excision 05/29/2013   Hyperlipidemia    Hypertension    Obesity    Sleep apnea    has a cpap, doesn't wear    Varicose vein    Past Surgical History:  Procedure Laterality Date   COLONOSCOPY WITH PROPOFOL N/A 09/08/2016   Procedure: COLONOSCOPY WITH PROPOFOL;  Surgeon: KJonathon Bellows MD;  Location: [facility] ENDOSCOPY;  Service: Endoscopy;  Laterality: N/A;   COLONOSCOPY WITH PROPOFOL N/A 01/07/2020   Procedure: COLONOSCOPY WITH PROPOFOL;  Surgeon: AJonathon Bellows MD;  [facility];  Service: Gastroenterology;  Laterality: N/A;   FRACTURE SURGERY     foot, screws placed   JOINT REPLACEMENT Right 2009   knee   Family History  Problem Relation Age of Onset   Cancer Mother    Heart failure Mother    Heart disease Mother    Breast cancer Mother [phone]  Heart attack Mother    AAA (abdominal aortic aneurysm) Father    Hypertension Father    Heart disease Father    Hypertension Sister    Social History   Socioeconomic History   Marital status: Married    Spouse name: Not on file   Number of children:  Not on file   Years of education: Not on file   Highest education level: Not on file  Occupational History   Not on file  Tobacco Use   Smoking status: Former    Types: Cigarettes    Quit date: 01/19/1988    Years since quitting: 33.7   Smokeless tobacco: Never  Vaping Use   Vaping Use: Never used  Substance and Sexual Activity   Alcohol use: Yes    Alcohol/week: 3.0 standard drinks    Types: 3 Shots of liquor per week    Comment: 3 drinks a week socially    Drug use: No   Sexual activity: Not on file  Other Topics Concern   Not on file  Social History Narrative   Not on file   Social Determinants of Health   Financial Resource Strain: Low Risk    Difficulty of Paying Living Expenses: Not hard at all  Food Insecurity: No Food Insecurity   Worried About  Charity fundraiser in the Last Year: Never true   [place] in the Last Year: Never true  Transportation Needs: No Transportation Needs   Lack of Transportation (Medical): No   Lack of Transportation (Non-Medical): No  Physical Activity: Insufficiently Active   Days of Exercise per Week: 3 days   Minutes of Exercise per Session: 30 min  Stress: No Stress Concern Present   Feeling of Stress : Not at all  Social Connections: Moderately Integrated   Frequency of Communication with Friends and Family: Never   Frequency of Social Gatherings with Friends and Family: More than three times a week   Attends Religious Services: More than 4 times per year   Active Member of [employer] or Organizations: No   Attends Music therapist: Never   Marital Status: Married    Tobacco Counseling Counseling given: Not Answered   Clinical Intake:  Pre-visit preparation completed: Yes        Diabetes: No  How often do you need to have someone help you when you read instructions, pamphlets, or other written materials from your doctor or pharmacy?: 1 - Never  Interpreter Needed?: No      Activities of Daily Living    10/11/2021    2:14 PM  In your present state of health, do you have any difficulty performing the following activities:  Hearing? 0  Vision? 0  Difficulty concentrating or making decisions? 0  Walking or climbing stairs? 0  Dressing or bathing? 0  Doing errands, shopping? 0  Preparing Food and eating ? N  Using the Toilet? N  In the past six months, have you accidently leaked urine? N  Do you have problems with loss of bowel control? N  Managing your Medications? N  Managing your Finances? N  Housekeeping or managing your Housekeeping? N    Patient Care Team: Einar Pheasant, MD as PCP - General (Internal Medicine) Kate Sable, MD as PCP - Cardiology (Cardiology)  Indicate any recent Medical Services you may have received from other than [facility]  providers in the past year (date may be approximate).     Assessment:   This is a routine wellness examination for Trenae.  Virtual Visit via Telephone Note  I connected with  Naylani Bradner Spaugh on 10/11/21 at  2:00 PM EDT by telephone and verified that I am speaking with the correct person using two identifiers.  Persons participating in the virtual visit: patient/Nurse Health Advisor   I discussed  the limitations of performing an evaluation and management service by telehealth. We continued and completed visit with audio only. Some vital signs may be absent or patient reported.   Hearing/Vision screen Hearing Screening - Comments:: Patient is able to hear conversational tones without difficulty. No issues reported. Vision Screening - Comments:: Wears corrective lenses  Dietary issues and exercise activities discussed: Current Exercise Habits: Home exercise routine, Intensity: Mild Regular diet Good water intake   Goals Addressed               This Visit's Progress     Patient Stated     Weight goal 150lb (pt-stated)   On track     Exercise at the gym, stay active Healthy diet       Depression Screen    10/11/2021    2:08 PM 09/06/2021    2:45 PM 08/13/2021    7:34 AM 07/06/2021    2:41 PM 04/01/2021    2:15 PM 12/25/2020    1:24 PM 08/25/2020    2:16 PM  PHQ 2/9 Scores  PHQ - 2 Score 0 '2 3 2 6 '$ 0 2  PHQ- 9 Score 0 '4 9 12 20  5    '$ Fall Risk    10/11/2021    2:45 PM 09/06/2021    2:45 PM 08/12/2021    4:22 PM 12/25/2020    1:24 PM 08/25/2020    1:40 PM  Fall Risk   Falls in the past year? 0 0 0 1 1  Number falls in past yr: 0  0 1 0  Injury with Fall?   0 0 0  Risk for fall due to :  No Fall Risks No Fall Risks History of fall(s)   Follow up Falls evaluation completed Falls evaluation completed Falls evaluation completed Falls evaluation completed     [place]: Home free of loose throw rugs in walkways, pet beds, electrical  cords, etc? Yes  Adequate lighting in your home to reduce risk of falls? Yes   ASSISTIVE DEVICES UTILIZED TO PREVENT FALLS: Life alert? No  Use of a cane, walker or w/c? No   TIMED UP AND GO: Was the test performed? No .   Cognitive Function:  Patient is alert and oriented x3.  Enjoys brain health activities.      10/11/2021    2:45 PM 10/09/2019   11:21 AM 08/17/2017   10:53 AM  6CIT Screen  What Year?  0 points 0 points  What month?  0 points 0 points  What time?   0 points  Count back from 20   0 points  Months in reverse 0 points 0 points 0 points  Repeat phrase   0 points  Total Score   0 points    Immunizations Immunization History  Administered Date(s) Administered   Influenza-Unspecified 03/04/2014, 03/27/2021   PFIZER(Purple Top)SARS-COV-2 Vaccination 07/22/2019, 08/12/2019, 02/20/2020   Pfizer Covid-19 Vaccine Bivalent Booster 25yr & up 03/27/2021   Pneumococcal Conjugate-13 01/15/2014   Pneumococcal-Unspecified 02/28/2013   Td 01/15/2014   Tdap 12/08/2016   Zoster, Live 01/02/2012   Shingrix Completed?: No.    Education has been provided regarding the importance of this vaccine. Patient has been advised to call insurance company to determine out of pocket expense if they have not yet received this vaccine. Advised may also receive vaccine at local pharmacy or Health Dept. Verbalized acceptance and understanding.  Screening Tests Health Maintenance  Topic Date Due  MAMMOGRAM  09/29/2021   Zoster Vaccines- Shingrix (1 of 2) 01/11/2022 (Originally 04/28/1965)   Pneumonia Vaccine 32+ Years old (2 - PPSV23 if available, else PCV20) 08/13/2022 (Originally 01/16/2015)   INFLUENZA VACCINE  12/21/2021   COLONOSCOPY (Pts 45-53yr Insurance coverage will need to be confirmed)  01/07/2023   TETANUS/TDAP  12/09/2026   DEXA SCAN  Completed   COVID-19 Vaccine  Completed   Hepatitis C Screening  Completed   HPV VACCINES  Aged Out   Health Maintenance Health  Maintenance Due  Topic Date Due   MAMMOGRAM  09/29/2021   Mammogram scheduled 10/14/21.   Lung Cancer Screening: (Low Dose CT Chest recommended if Age 75-80years, 30 pack-year currently smoking OR have quit w/in 15years.) does not qualify.   Vision Screening: Recommended annual ophthalmology exams for early detection of glaucoma and other disorders of the eye.  Dental Screening: Recommended annual dental exams for proper oral hygiene  Community Resource Referral / Chronic Care Management: CRR required this visit?  No   CCM required this visit?  No      Plan:   Keep all routine maintenance appointments.   I have personally reviewed and noted the following in the patient's chart:   Medical and social history Use of alcohol, tobacco or illicit drugs  Current medications and supplements including opioid prescriptions.  Functional ability and status Nutritional status Physical activity Advanced directives List of other physicians Hospitalizations, surgeries, and ER visits in previous 12 months Vitals Screenings to include cognitive, depression, and falls Referrals and appointments  In addition, I have reviewed and discussed with patient certain preventive protocols, quality metrics, and best practice recommendations. A written personalized care plan for preventive services as well as general preventive health recommendations were provided to patient.     OVarney Biles LPN   [id number]

## 2021-10-12 ENCOUNTER — Other Ambulatory Visit: Payer: Self-pay | Admitting: Internal Medicine

## 2021-10-12 DIAGNOSIS — F3342 Major depressive disorder, recurrent, in full remission: Secondary | ICD-10-CM

## 2021-10-12 DIAGNOSIS — I10 Essential (primary) hypertension: Secondary | ICD-10-CM

## 2021-10-14 ENCOUNTER — Ambulatory Visit
Admission: RE | Admit: 2021-10-14 | Discharge: 2021-10-14 | Disposition: A | Discharge status: Home / Self Care | Payer: Medicare Other | Source: Ambulatory Visit | Attending: Internal Medicine | Admitting: Internal Medicine

## 2021-10-14 DIAGNOSIS — Z1231 Encounter for screening mammogram for malignant neoplasm of breast: Secondary | ICD-10-CM | POA: Diagnosis not present

## 2021-11-10 ENCOUNTER — Encounter: Payer: Self-pay | Admitting: Internal Medicine

## 2021-11-16 ENCOUNTER — Other Ambulatory Visit: Payer: Medicare Other

## 2021-11-18 ENCOUNTER — Ambulatory Visit: Payer: Medicare Other | Admitting: Internal Medicine

## 2021-11-29 ENCOUNTER — Other Ambulatory Visit (INDEPENDENT_AMBULATORY_CARE_PROVIDER_SITE_OTHER): Payer: Medicare Other

## 2021-11-29 DIAGNOSIS — E785 Hyperlipidemia, unspecified: Secondary | ICD-10-CM | POA: Diagnosis not present

## 2021-11-29 DIAGNOSIS — R739 Hyperglycemia, unspecified: Secondary | ICD-10-CM | POA: Diagnosis not present

## 2021-11-29 DIAGNOSIS — I10 Essential (primary) hypertension: Secondary | ICD-10-CM

## 2021-11-29 LAB — BASIC METABOLIC PANEL
BUN: 20 mg/dL (ref 6–23)
CO2: 28 mEq/L (ref 19–32)
Calcium: 9.1 mg/dL (ref 8.4–10.5)
Chloride: 105 mEq/L (ref 96–112)
Creatinine, Ser: 0.62 mg/dL (ref 0.40–1.20)
GFR: 87.62 mL/min (ref >60.00)
Glucose, Bld: 95 mg/dL (ref 70–99)
Potassium: 4.2 mEq/L (ref 3.5–5.1)
Sodium: 138 mEq/L (ref 135–145)

## 2021-11-29 LAB — HEPATIC FUNCTION PANEL
ALT: 12 U/L (ref 0–35)
AST: 13 U/L (ref 0–37)
Albumin: 4.1 g/dL (ref 3.5–5.2)
Alkaline Phosphatase: 88 U/L (ref 39–117)
Bilirubin, Direct: 0.1 mg/dL (ref 0.0–0.3)
Total Bilirubin: 0.5 mg/dL (ref 0.2–1.2)
Total Protein: 6.5 g/dL (ref 6.0–8.3)

## 2021-11-29 LAB — LIPID PANEL
Cholesterol: 245 mg/dL — ABNORMAL HIGH (ref 0–200)
HDL: 81.9 mg/dL (ref >39.00)
LDL Cholesterol: 131 mg/dL — ABNORMAL HIGH (ref 0–99)
NonHDL: 162.95
Total CHOL/HDL Ratio: 3
Triglycerides: 162 mg/dL — ABNORMAL HIGH (ref 0.0–149.0)
VLDL: 32.4 mg/dL (ref 0.0–40.0)

## 2021-11-29 LAB — HEMOGLOBIN A1C: Hgb A1c MFr Bld: 5.5 % (ref 4.6–6.5)

## 2021-11-30 ENCOUNTER — Telehealth: Payer: Self-pay | Admitting: Internal Medicine

## 2021-11-30 NOTE — Telephone Encounter (Signed)
Patient returned office phone call for lab results. 

## 2021-12-01 ENCOUNTER — Ambulatory Visit (INDEPENDENT_AMBULATORY_CARE_PROVIDER_SITE_OTHER): Payer: Medicare Other | Admitting: Dermatology

## 2021-12-01 DIAGNOSIS — Z85828 Personal history of other malignant neoplasm of skin: Secondary | ICD-10-CM

## 2021-12-01 DIAGNOSIS — Z86018 Personal history of other benign neoplasm: Secondary | ICD-10-CM | POA: Diagnosis not present

## 2021-12-01 DIAGNOSIS — L578 Other skin changes due to chronic exposure to nonionizing radiation: Secondary | ICD-10-CM | POA: Diagnosis not present

## 2021-12-01 DIAGNOSIS — L814 Other melanin hyperpigmentation: Secondary | ICD-10-CM

## 2021-12-01 DIAGNOSIS — L820 Inflamed seborrheic keratosis: Secondary | ICD-10-CM | POA: Diagnosis not present

## 2021-12-01 DIAGNOSIS — Z1283 Encounter for screening for malignant neoplasm of skin: Secondary | ICD-10-CM | POA: Diagnosis not present

## 2021-12-01 DIAGNOSIS — L821 Other seborrheic keratosis: Secondary | ICD-10-CM

## 2021-12-01 DIAGNOSIS — D1800 Hemangioma unspecified site: Secondary | ICD-10-CM | POA: Diagnosis not present

## 2021-12-01 DIAGNOSIS — D229 Melanocytic nevi, unspecified: Secondary | ICD-10-CM

## 2021-12-01 NOTE — Progress Notes (Signed)
Follow-Up Visit   Subjective  Sherry Davila is a 76 y.o. female who presents for the following: Annual Exam (History of BCC and dysplastic nevus - The patient presents for Total-Body Skin Exam (TBSE) for skin cancer screening and mole check.  The patient has spots, moles and lesions to be evaluated, some may be new or changing and the patient has concerns that these could be cancer./).  The following portions of the chart were reviewed this encounter and updated as appropriate:   Tobacco  Allergies  Meds  Problems  Med Hx  Surg Hx  Fam Hx     Review of Systems:  No other skin or systemic complaints except as noted in HPI or Assessment and Plan.  Objective  Well appearing patient in no apparent distress; mood and affect are within normal limits.  A full examination was performed including scalp, head, eyes, ears, nose, lips, neck, chest, axillae, abdomen, back, buttocks, bilateral upper extremities, bilateral lower extremities, hands, feet, fingers, toes, fingernails, and toenails. All findings within normal limits unless otherwise noted below.  Face x 9, left inframammary x 2 (11) Erythematous stuck-on, waxy papule or plaque   Assessment & Plan   History of Basal Cell Carcinoma of the Skin - No evidence of recurrence today - Recommend regular full body skin exams - Recommend daily broad spectrum sunscreen SPF 30+ to sun-exposed areas, reapply every 2 hours as needed.  - Call if any new or changing lesions are noted between office visits  History of Dysplastic Nevi - No evidence of recurrence today - Recommend regular full body skin exams - Recommend daily broad spectrum sunscreen SPF 30+ to sun-exposed areas, reapply every 2 hours as needed.  - Call if any new or changing lesions are noted between office visits  Lentigines - Scattered tan macules - Due to sun exposure - Benign-appearing, observe - Recommend daily broad spectrum sunscreen SPF 30+ to sun-exposed  areas, reapply every 2 hours as needed. - Call for any changes  Seborrheic Keratoses - Stuck-on, waxy, tan-brown papules and/or plaques  - Benign-appearing - Discussed benign etiology and prognosis. - Observe - Call for any changes  Melanocytic Nevi - Tan-brown and/or pink-flesh-colored symmetric macules and papules - Benign appearing on exam today - Observation - Call clinic for new or changing moles - Recommend daily use of broad spectrum spf 30+ sunscreen to sun-exposed areas.   Hemangiomas - Red papules - Discussed benign nature - Observe - Call for any changes  Actinic Damage - Chronic condition, secondary to cumulative UV/sun exposure - diffuse scaly erythematous macules with underlying dyspigmentation - Recommend daily broad spectrum sunscreen SPF 30+ to sun-exposed areas, reapply every 2 hours as needed.  - Staying in the shade or wearing long sleeves, sun glasses (UVA+UVB protection) and wide brim hats (4-inch brim around the entire circumference of the hat) are also recommended for sun protection.  - Call for new or changing lesions.  Skin cancer screening performed today.  Inflamed seborrheic keratosis (11) Face x 9, left inframammary x 2  Destruction of lesion - Face x 9, left inframammary x 2 Complexity: simple   Destruction method: cryotherapy   Informed consent: discussed and consent obtained   Timeout:  patient name, date of birth, surgical site, and procedure verified Lesion destroyed using liquid nitrogen: Yes   Region frozen until ice ball extended beyond lesion: Yes   Outcome: patient tolerated procedure well with no complications   Post-procedure details: wound care instructions given  Skin cancer screening  Actinic skin damage  Nevus   Return in about 8 months (around 08/02/2022).  I, Ashok Cordia, CMA, am acting as scribe for Sarina Ser, MD . Documentation: I have reviewed the above documentation for accuracy and completeness, and I  agree with the above.  Sarina Ser, MD

## 2021-12-01 NOTE — Patient Instructions (Signed)
Cryotherapy Aftercare  Wash gently with soap and water everyday.   Apply Vaseline and Band-Aid daily until healed.     Due to recent changes in healthcare laws, you may see results of your pathology and/or laboratory studies on MyChart before the doctors have had a chance to review them. We understand that in some cases there may be results that are confusing or concerning to you. Please understand that not all results are received at the same time and often the doctors may need to interpret multiple results in order to provide you with the best plan of care or course of treatment. Therefore, we ask that you please give us 2 business days to thoroughly review all your results before contacting the office for clarification. Should we see a critical lab result, you will be contacted sooner.   If You Need Anything After Your Visit  If you have any questions or concerns for your doctor, please call our main line at [phone] and press option 4 to reach your doctor's medical assistant. If no one answers, please leave a voicemail as directed and we will return your call as soon as possible. Messages left after 4 pm will be answered the following business day.   You may also send us a message via MyChart. We typically respond to MyChart messages within 1-2 business days.  For prescription refills, please ask your pharmacy to contact our office. Our fax number is [phone].  If you have an urgent issue when the clinic is closed that cannot wait until the next business day, you can page your doctor at the number below.    Please note that while we do our best to be available for urgent issues outside of office hours, we are not available 24/7.   If you have an urgent issue and are unable to reach us, you may choose to seek medical care at your doctor's office, retail clinic, urgent care center, or emergency room.  If you have a medical emergency, please immediately call 911 or go to the  emergency department.  Pager Numbers  - Dr. Kowalski: [phone]  - Dr. Moye: [phone]  - Dr. Stewart: [phone]  In the event of inclement weather, please call our main line at [phone] for an update on the status of any delays or closures.  Dermatology Medication Tips: Please keep the boxes that topical medications come in in order to help keep track of the instructions about where and how to use these. Pharmacies typically print the medication instructions only on the boxes and not directly on the medication tubes.   If your medication is too expensive, please contact our office at [phone] option 4 or send us a message through MyChart.   We are unable to tell what your co-pay for medications will be in advance as this is different depending on your insurance coverage. However, we may be able to find a substitute medication at lower cost or fill out paperwork to get insurance to cover a needed medication.   If a prior authorization is required to get your medication covered by your insurance company, please allow us 1-2 business days to complete this process.  Drug prices often vary depending on where the prescription is filled and some pharmacies may offer cheaper prices.  The website www.goodrx.com contains coupons for medications through different pharmacies. The prices here do not account for what the cost may be with help from insurance (it may be cheaper with your insurance), but the website can   give you the price if you did not use any insurance.  - You can print the associated coupon and take it with your prescription to the pharmacy.  - You may also stop by our office during regular business hours and pick up a GoodRx coupon card.  - If you need your prescription sent electronically to a different pharmacy, notify our office through [facility] MyChart or by phone at [phone] option 4.     Si Usted Necesita Algo Despus de Su Visita  Tambin puede  enviarnos un mensaje a travs de MyChart. Por lo general respondemos a los mensajes de MyChart en el transcurso de 1 a 2 das hbiles.  Para renovar recetas, por favor pida a su farmacia que se ponga en contacto con nuestra oficina. Nuestro nmero de fax es el [phone].  Si tiene un asunto urgente cuando la clnica est cerrada y que no puede esperar hasta el siguiente da hbil, puede llamar/localizar a su doctor(a) al nmero que aparece a continuacin.   Por favor, tenga en cuenta que aunque hacemos todo lo posible para estar disponibles para asuntos urgentes fuera del horario de oficina, no estamos disponibles las 24 horas del da, los 7 das de la semana.   Si tiene un problema urgente y no puede comunicarse con nosotros, puede optar por buscar atencin mdica  en el consultorio de su doctor(a), en una clnica privada, en un centro de atencin urgente o en una sala de emergencias.  Si tiene una emergencia mdica, por favor llame inmediatamente al 911 o vaya a la sala de emergencias.  Nmeros de bper  - Dr. Kowalski: [phone]  - Dra. Moye: [phone]  - Dra. Stewart: [phone]  En caso de inclemencias del tiempo, por favor llame a nuestra lnea principal al [phone] para una actualizacin sobre el estado de cualquier retraso o cierre.  Consejos para la medicacin en dermatologa: Por favor, guarde las cajas en las que vienen los medicamentos de uso tpico para ayudarle a seguir las instrucciones sobre dnde y cmo usarlos. Las farmacias generalmente imprimen las instrucciones del medicamento slo en las cajas y no directamente en los tubos del medicamento.   Si su medicamento es muy caro, por favor, pngase en contacto con nuestra oficina llamando al [phone] y presione la opcin 4 o envenos un mensaje a travs de MyChart.   No podemos decirle cul ser su copago por los medicamentos por adelantado ya que esto es diferente dependiendo de la cobertura de su seguro.  Sin embargo, es posible que podamos encontrar un medicamento sustituto a menor costo o llenar un formulario para que el seguro cubra el medicamento que se considera necesario.   Si se requiere una autorizacin previa para que su compaa de seguros cubra su medicamento, por favor permtanos de 1 a 2 das hbiles para completar este proceso.  Los precios de los medicamentos varan con frecuencia dependiendo del lugar de dnde se surte la receta y alguna farmacias pueden ofrecer precios ms baratos.  El sitio web www.goodrx.com tiene cupones para medicamentos de diferentes farmacias. Los precios aqu no tienen en cuenta lo que podra costar con la ayuda del seguro (puede ser ms barato con su seguro), pero el sitio web puede darle el precio si no utiliz ningn seguro.  - Puede imprimir el cupn correspondiente y llevarlo con su receta a la farmacia.  - Tambin puede pasar por nuestra oficina durante el horario de atencin regular y recoger una tarjeta de cupones de GoodRx.  -   Si necesita que su receta se enve electrnicamente a una farmacia diferente, informe a nuestra oficina a travs de MyChart de [place] o por telfono llamando al [phone] y presione la opcin 4.  

## 2021-12-02 ENCOUNTER — Ambulatory Visit: Payer: Medicare Other | Admitting: Internal Medicine

## 2021-12-02 NOTE — Telephone Encounter (Signed)
See result note.  

## 2021-12-06 DIAGNOSIS — Z0289 Encounter for other administrative examinations: Secondary | ICD-10-CM

## 2021-12-11 ENCOUNTER — Encounter: Payer: Self-pay | Admitting: Dermatology

## 2021-12-13 DIAGNOSIS — M545 Low back pain, unspecified: Secondary | ICD-10-CM | POA: Diagnosis not present

## 2021-12-13 DIAGNOSIS — G8929 Other chronic pain: Secondary | ICD-10-CM | POA: Diagnosis not present

## 2021-12-20 ENCOUNTER — Ambulatory Visit (INDEPENDENT_AMBULATORY_CARE_PROVIDER_SITE_OTHER): Payer: Medicare Other | Admitting: Internal Medicine

## 2021-12-20 ENCOUNTER — Encounter: Payer: Self-pay | Admitting: Internal Medicine

## 2021-12-20 DIAGNOSIS — I10 Essential (primary) hypertension: Secondary | ICD-10-CM | POA: Diagnosis not present

## 2021-12-20 DIAGNOSIS — E785 Hyperlipidemia, unspecified: Secondary | ICD-10-CM | POA: Diagnosis not present

## 2021-12-20 DIAGNOSIS — F320 Major depressive disorder, single episode, mild: Secondary | ICD-10-CM

## 2021-12-20 DIAGNOSIS — R739 Hyperglycemia, unspecified: Secondary | ICD-10-CM

## 2021-12-20 DIAGNOSIS — G4730 Sleep apnea, unspecified: Secondary | ICD-10-CM | POA: Diagnosis not present

## 2021-12-20 MED ORDER — WEGOVY 0.25 MG/0.5ML ~~LOC~~ SOAJ: 0.2500 mg | SUBCUTANEOUS | 2 refills | Status: DC

## 2021-12-20 NOTE — Assessment & Plan Note (Addendum)
The 10-year ASCVD risk score (Arnett DK, et al., 2019) is: 21%   Values used to calculate the score:     Age: 76 years     Sex: Female     Is Non-Hispanic African American: No     Diabetic: No     Tobacco smoker: No     Systolic Blood Pressure: 027 mmHg     Is BP treated: Yes     HDL Cholesterol: 81.9 mg/dL     Total Cholesterol: 245 mg/dL  Have discussed calculated cholesterol risk.  Discussed recommendation to start cholesterol medication.  Wants to work on diet and exercise.  Follow lipid panel.

## 2021-12-20 NOTE — Progress Notes (Signed)
Patient ID: Sherry Davila, female   DOB: 10-29-1945, 76 y.o.   MRN: [id number]   Subjective:    Patient ID: Sherry Davila, female    DOB: May 03, 1946, 76 y.o.   MRN: [id number]   Patient here for a scheduled follow up .   HPI Here to follow up regarding increased stress, anxiety, depression, blood pressure and cholesterol.  She has adjusted her diet.  Doing weight loss.  Has lost weight.  Feeling better.  Discussed diet and exercise.  Have referred her to weight loss management.  Has appt scheduled.  Has been doing weight watchers and started exercising with a partner.  No chest pain.  Breathing stable.  No increased acid reflux reported.  No abdominal pain.    Past Medical History:  Diagnosis Date   Actinic keratosis 04/04/2018   L nose lat to supra tip   Anxiety    Basal cell carcinoma 04/04/2018   R prox nasal ala   Depression    Dysplastic nevus 05/07/2013   R abdomen - mod to severe, excision 05/29/2013   Hyperlipidemia    Hypertension    Obesity    Sleep apnea    has a cpap, doesn't wear    Varicose vein    Past Surgical History:  Procedure Laterality Date   COLONOSCOPY WITH PROPOFOL N/A 09/08/2016   Procedure: COLONOSCOPY WITH PROPOFOL;  Surgeon: Jonathon Bellows, MD;  Location: [facility] ENDOSCOPY;  Service: Endoscopy;  Laterality: N/A;   COLONOSCOPY WITH PROPOFOL N/A 01/07/2020   Procedure: COLONOSCOPY WITH PROPOFOL;  Surgeon: Jonathon Bellows, MD;  Location: [facility] ENDOSCOPY;  Service: Gastroenterology;  Laterality: N/A;   FRACTURE SURGERY     foot, screws placed   JOINT REPLACEMENT Right 2009   knee   Family History  Problem Relation Age of Onset   Cancer Mother    Heart failure Mother    Heart disease Mother    Breast cancer Mother 42   Heart attack Mother    Obesity Father    Depression Father    Early death Father    Hyperlipidemia Father    AAA (abdominal aortic aneurysm) Father    Hypertension Father    Heart disease Father    Hypertension Sister    Social  History   Socioeconomic History   Marital status: Married    Spouse name: Not on file   Number of children: Not on file   Years of education: Not on file   Highest education level: Not on file  Occupational History   Not on file  Tobacco Use   Smoking status: Former    Types: Cigarettes    Quit date: 01/19/1988    Years since quitting: 33.9   Smokeless tobacco: Never  Vaping Use   Vaping Use: Never used  Substance and Sexual Activity   Alcohol use: Yes    Alcohol/week: 3.0 standard drinks of alcohol    Types: 3 Shots of liquor per week    Comment: 3 drinks a week socially    Drug use: No   Sexual activity: Not on file  Other Topics Concern   Not on file  Social History Narrative   Not on file   Social Determinants of Health   Financial Resource Strain: Low Risk  (10/11/2021)   Overall Financial Resource Strain (CARDIA)    Difficulty of Paying Living Expenses: Not hard at all  Food Insecurity: No Food Insecurity (10/11/2021)   Hunger Vital Sign    Worried About Running Out  of Food in the Last Year: Never true    [place] in the Last Year: Never true  Transportation Needs: No Transportation Needs (10/11/2021)   PRAPARE - Hydrologist (Medical): No    Lack of Transportation (Non-Medical): No  Physical Activity: Insufficiently Active (10/11/2021)   Exercise Vital Sign    Days of Exercise per Week: 3 days    Minutes of Exercise per Session: 30 min  Stress: No Stress Concern Present (10/11/2021)   [place]    Feeling of Stress : Not at all  Social Connections: Moderately Integrated (10/11/2021)   Social Connection and Isolation Panel [NHANES]    Frequency of Communication with Friends and Family: Never    Frequency of Social Gatherings with Friends and Family: More than three times a week    Attends Religious Services: More than 4 times per year    Active Member of [employer] or  Organizations: No    Attends Archivist Meetings: Never    Marital Status: Married     Review of Systems  Constitutional:  Negative for appetite change and unexpected weight change.  HENT:  Negative for congestion and sinus pressure.   Respiratory:  Negative for cough, chest tightness and shortness of breath.   Cardiovascular:  Negative for chest pain, palpitations and leg swelling.  Gastrointestinal:  Negative for abdominal pain, diarrhea, nausea and vomiting.  Genitourinary:  Negative for difficulty urinating and dysuria.  Musculoskeletal:  Negative for joint swelling and myalgias.  Skin:  Negative for color change and rash.  Neurological:  Negative for dizziness, light-headedness and headaches.  Psychiatric/Behavioral:  Negative for agitation and dysphoric mood.        Objective:     BP 134/74 (BP Location: Left Arm, Patient Position: Sitting, Cuff Size: Large)   Pulse 60   Temp 98.6 F (37 C) (Temporal)   Resp 18   Ht '5\' 6"'  (1.676 m)   Wt 223 lb 12.8 oz (101.5 kg)   SpO2 99%   BMI 36.12 kg/m  Wt Readings from Last 3 Encounters:  12/23/21 224 lb (101.6 kg)  12/20/21 223 lb 12.8 oz (101.5 kg)  10/11/21 231 lb (104.8 kg)    Physical Exam Vitals reviewed.  Constitutional:      General: She is not in acute distress.    Appearance: Normal appearance.  HENT:     Head: Normocephalic and atraumatic.     Right Ear: External ear normal.     Left Ear: External ear normal.  Eyes:     General: No scleral icterus.       Right eye: No discharge.        Left eye: No discharge.     Conjunctiva/sclera: Conjunctivae normal.  Neck:     Thyroid: No thyromegaly.  Cardiovascular:     Rate and Rhythm: Normal rate and regular rhythm.  Pulmonary:     Effort: No respiratory distress.     Breath sounds: Normal breath sounds. No wheezing.  Abdominal:     General: Bowel sounds are normal.     Palpations: Abdomen is soft.     Tenderness: There is no abdominal tenderness.   Musculoskeletal:        General: No swelling or tenderness.     Cervical back: Neck supple. No tenderness.  Lymphadenopathy:     Cervical: No cervical adenopathy.  Skin:    Findings: No erythema or rash.  Neurological:     Mental Status: She is alert.  Psychiatric:        Mood and Affect: Mood normal.        Behavior: Behavior normal.      Outpatient Encounter Medications as of 12/20/2021  Medication Sig   amLODipine (NORVASC) 5 MG tablet TAKE ONE TABLET BY MOUTH DAILY   atenolol (TENORMIN) 50 MG tablet TAKE ONE TABLET BY MOUTH DAILY   FLUoxetine (PROZAC) 40 MG capsule TAKE ONE CAPSULE BY MOUTH DAILY   losartan-hydrochlorothiazide (HYZAAR) 100-12.5 MG tablet TAKE ONE TABLET BY MOUTH DAILY   [DISCONTINUED] celecoxib (CELEBREX) 100 MG capsule Take 1 capsule by mouth 2 (two) times daily.   [DISCONTINUED] Semaglutide-Weight Management (WEGOVY) 0.25 MG/0.5ML SOAJ Inject 0.25 mg into the skin once a week. (Patient not taking: Reported on 12/23/2021)   No facility-administered encounter medications on file as of 12/20/2021.     Lab Results  Component Value Date   WBC 5.9 08/16/2021   HGB 13.9 08/16/2021   HCT 41.6 08/16/2021   PLT 258.0 08/16/2021   GLUCOSE 95 11/29/2021   CHOL 245 (H) 11/29/2021   TRIG 162.0 (H) 11/29/2021   HDL 81.90 11/29/2021   LDLCALC 131 (H) 11/29/2021   ALT 12 11/29/2021   AST 13 11/29/2021   NA 138 11/29/2021   K 4.2 11/29/2021   CL 105 11/29/2021   CREATININE 0.62 11/29/2021   BUN 20 11/29/2021   CO2 28 11/29/2021   TSH 1.990 12/23/2021   HGBA1C 5.5 11/29/2021    MM 3D SCREEN BREAST BILATERAL  Result Date: 10/15/2021 CLINICAL DATA:  Screening. EXAM: DIGITAL SCREENING BILATERAL MAMMOGRAM WITH TOMOSYNTHESIS AND CAD TECHNIQUE: Bilateral screening digital craniocaudal and mediolateral oblique mammograms were obtained. Bilateral screening digital breast tomosynthesis was performed. The images were evaluated with computer-aided detection. COMPARISON:   Previous exam(s). ACR Breast Density Category b: There are scattered areas of fibroglandular density. FINDINGS: There are no findings suspicious for malignancy. IMPRESSION: No mammographic evidence of malignancy. A result letter of this screening mammogram will be mailed directly to the patient. RECOMMENDATION: Screening mammogram in one year. (Code:SM-B-01Y) BI-RADS CATEGORY  1: Negative. Electronically Signed   By: Dorise Bullion III M.D.   On: 10/15/2021 14:37       Assessment & Plan:   Problem List Items Addressed This Visit     Depression, major, single episode, mild ([place])    Continues on prozac.  Overall she feels she is doing ok.  Appears to be doing better. Follow.       Hyperglycemia    Low carb diet and exercise. Follow met b and a1c.  Lab Results  Component Value Date   HGBA1C 5.5 11/29/2021       Hyperlipidemia    The 10-year ASCVD risk score (Arnett DK, et al., 2019) is: 21%   Values used to calculate the score:     Age: 89 years     Sex: Female     Is Non-Hispanic African American: No     Diabetic: No     Tobacco smoker: No     Systolic Blood Pressure: 791 mmHg     Is BP treated: Yes     HDL Cholesterol: 81.9 mg/dL     Total Cholesterol: 245 mg/dL  Have discussed calculated cholesterol risk.  Discussed recommendation to start cholesterol medication.  Wants to work on diet and exercise.  Follow lipid panel.       Hypertension    Continue losartan/hctz, amlodipine and atenolol.  Blood pressure recheck by me:  128/84. Follow pressures.  Follow metabolic panel. Hold on making changes in medication.        Major depressive disorder, single episode, mild (HCC)    Continues on prozac.  Overall she feels she is doing ok.  Appears to be doing better. Follow.       Sleep apnea    CPAP.         Einar Pheasant, MD

## 2021-12-21 ENCOUNTER — Telehealth: Payer: Self-pay

## 2021-12-21 NOTE — Telephone Encounter (Signed)
Marcellina Millin ([name]) Rx #: [id number] [phone] 0.'25MG'$ /0.5ML auto-injectors   Form [employer] Electronic Prior Authorization Request Form [phone] NCPDP) Created 1 day ago Sent to Plan 1 minute ago Plan Response less than a minute ago Submit Clinical Questions less than a minute ago Determination Wait for Determination Please wait for [facility] 2017 to return a determination.

## 2021-12-22 DIAGNOSIS — G8929 Other chronic pain: Secondary | ICD-10-CM | POA: Diagnosis not present

## 2021-12-22 DIAGNOSIS — M545 Low back pain, unspecified: Secondary | ICD-10-CM | POA: Diagnosis not present

## 2021-12-23 ENCOUNTER — Ambulatory Visit (INDEPENDENT_AMBULATORY_CARE_PROVIDER_SITE_OTHER): Payer: Medicare Other | Admitting: Family Medicine

## 2021-12-23 ENCOUNTER — Encounter (INDEPENDENT_AMBULATORY_CARE_PROVIDER_SITE_OTHER): Payer: Self-pay | Admitting: Family Medicine

## 2021-12-23 VITALS — BP 145/75 | HR 55 | Temp 98.9°F | Ht 65.0 in | Wt 224.0 lb

## 2021-12-23 DIAGNOSIS — Z6837 Body mass index (BMI) 37.0-37.9, adult: Secondary | ICD-10-CM | POA: Diagnosis not present

## 2021-12-23 DIAGNOSIS — M169 Osteoarthritis of hip, unspecified: Secondary | ICD-10-CM

## 2021-12-23 DIAGNOSIS — E669 Obesity, unspecified: Secondary | ICD-10-CM | POA: Diagnosis not present

## 2021-12-23 DIAGNOSIS — E559 Vitamin D deficiency, unspecified: Secondary | ICD-10-CM | POA: Diagnosis not present

## 2021-12-23 DIAGNOSIS — F3289 Other specified depressive episodes: Secondary | ICD-10-CM

## 2021-12-23 DIAGNOSIS — G4733 Obstructive sleep apnea (adult) (pediatric): Secondary | ICD-10-CM

## 2021-12-23 DIAGNOSIS — I10 Essential (primary) hypertension: Secondary | ICD-10-CM | POA: Diagnosis not present

## 2021-12-23 DIAGNOSIS — R0602 Shortness of breath: Secondary | ICD-10-CM | POA: Diagnosis not present

## 2021-12-23 DIAGNOSIS — R5383 Other fatigue: Secondary | ICD-10-CM

## 2021-12-26 ENCOUNTER — Encounter: Payer: Self-pay | Admitting: Internal Medicine

## 2021-12-26 NOTE — Assessment & Plan Note (Signed)
Low carb diet and exercise. Follow met b and a1c.  Lab Results  Component Value Date   HGBA1C 5.5 11/29/2021

## 2021-12-26 NOTE — Assessment & Plan Note (Signed)
Continues on prozac.  Overall she feels she is doing ok.  Appears to be doing better. Follow.  

## 2021-12-26 NOTE — Assessment & Plan Note (Signed)
Continue losartan/hctz, amlodipine and atenolol.  Blood pressure recheck by me:  128/84. Follow pressures.  Follow metabolic panel. Hold on making changes in medication.

## 2021-12-26 NOTE — Assessment & Plan Note (Signed)
CPAP.  

## 2021-12-27 ENCOUNTER — Encounter (INDEPENDENT_AMBULATORY_CARE_PROVIDER_SITE_OTHER): Payer: Self-pay | Admitting: Family Medicine

## 2021-12-27 DIAGNOSIS — E559 Vitamin D deficiency, unspecified: Secondary | ICD-10-CM | POA: Insufficient documentation

## 2021-12-29 ENCOUNTER — Encounter (INDEPENDENT_AMBULATORY_CARE_PROVIDER_SITE_OTHER): Payer: Self-pay

## 2021-12-29 LAB — TSH+FREE T4
Free T4: 1.15 ng/dL (ref 0.82–1.77)
TSH: 1.99 u[IU]/mL (ref 0.450–4.500)

## 2021-12-29 LAB — VITAMIN D 25 HYDROXY (VIT D DEFICIENCY, FRACTURES): Vit D, 25-Hydroxy: 24.2 ng/mL — ABNORMAL LOW (ref 30.0–100.0)

## 2021-12-29 LAB — INSULIN, FREE AND TOTAL
Free Insulin: 7.3 uU/mL
Total Insulin: 7.3 uU/mL

## 2021-12-29 NOTE — Progress Notes (Signed)
Chief Complaint:   OBESITY Sherry Davila (MR# [id number]) is a 76 y.o. female who presents for evaluation and treatment of obesity and related comorbidities. Current BMI is Body mass index is 37.28 kg/m. Sherry Davila has been struggling with her weight for many years and has been unsuccessful in either losing weight, maintaining weight loss, or reaching her healthy weight goal.  Sherry Davila has actively worked on weight loss for 3 weeks and lost 8 pounds.  She rejoined weight watchers.  She feels her hungry is at dinner.  Did start to gain weight in childhood.  Lost weight in her 25s with smoking and a lot of coffee.  Her weight increased with menopause and quitting smoking.  Retired from sedentary job.  Her husband is supportive.  Never used AOM.  Denies sweet cravings.  Going to the gym 3 times per week, and doing cardio and weight training.  Sherry Davila is currently in the action stage of change and ready to dedicate time achieving and maintaining a healthier weight. Sherry Davila is interested in becoming our patient and working on intensive lifestyle modifications including (but not limited to) diet and exercise for weight loss.  Sherry Davila's habits were reviewed today and are as follows: Her family eats meals together, she thinks her family will eat healthier with her, she struggles with family and or coworkers weight loss sabotage, her desired weight loss is 74 lbs, she has been heavy most of her life, she started gaining weight prepuberty, her heaviest weight ever was 284 pounds, she has significant food cravings issues, she snacks frequently in the evenings, she skips meals frequently, she is frequently drinking liquids with calories, she frequently makes poor food choices, she has problems with excessive hunger, she frequently eats larger portions than normal, she has binge eating behaviors, and she struggles with emotional eating.  Depression Screen Sherry Davila's Food and Mood (modified PHQ-9) score was 24.      12/23/2021    9:23 AM  Depression screen PHQ 2/9  Decreased Interest 3  Down, Depressed, Hopeless 3  PHQ - 2 Score 6  Altered sleeping 3  Tired, decreased energy 3  Change in appetite 2  Feeling bad or failure about yourself  3  Trouble concentrating 2  Moving slowly or fidgety/restless 3  Suicidal thoughts 2  PHQ-9 Score 24  Difficult doing work/chores Very difficult   Subjective:   1. Other fatigue Sherry Davila admits to daytime somnolence and admits to waking up still tired. Patient has a history of symptoms of daytime fatigue and morning fatigue. Sherry Davila generally gets 8 hours of sleep per night, and states that she has nightime awakenings. Snoring is present. Apneic episodes are present. Epworth Sleepiness Score is 11.  EKG reviewed today.  CBC within normal limits on August 16, 2021.  Echo 2595, diastolic dysfunction grade 2.  2. SOB (shortness of breath) Sherry Davila notes increasing shortness of breath with exercising and seems to be worsening over time with weight gain. She notes getting out of breath sooner with activity than she used to. This has not gotten worse recently. Sherry Davila denies shortness of breath at rest or orthopnea.  3. OSA (obstructive sleep apnea) Sherry Davila does wear her CPAP, and has been compliant.  4. Benign essential HTN Sherry Davila blood pressure is elevated today.  She is on amlodipine 5 mg daily, atenolol 50 mg daily, and Hyzaar 100-12.5 mg daily.  She does not monitor her blood pressure at home.  5. Osteoarthritis of hip, unspecified laterality, unspecified osteoarthritis  type Sherry Davila notes hip pain limits walking, going to the gym.  6. Other depression, with emotional eating Sherry Davila's PHQ-9 score is 24.  She is on Fluoxetine 40 mg daily.  She feels stable and has a good support system.  Assessment/Plan:   1. Other fatigue Sherry Davila does feel that her weight is causing her energy to be lower than it should be. Fatigue may be related to obesity, depression or many other causes. Labs  will be ordered, and in the meanwhile, Sherry Davila will focus on self care including making healthy food choices, increasing physical activity and focusing on stress reduction.  - EKG 12-Lead - Insulin, Free and Total - VITAMIN D 25 Hydroxy (Vit-D Deficiency, Fractures) - TSH + free T4  2. SOB (shortness of breath) Sherry Davila does feel that she gets out of breath more easily that she used to when she exercises. Sherry Davila's shortness of breath appears to be obesity related and exercise induced. She has agreed to work on weight loss and gradually increase exercise to treat her exercise induced shortness of breath. Will continue to monitor closely.  3. OSA (obstructive sleep apnea) Sherry Davila will continue her CPAP nightly and work on getting 8 to 9 hours of sleep.  4. Benign essential HTN Sherry Davila will continue her current medications per her PCP.  She is to look for improvements with weight loss.  5. Osteoarthritis of hip, unspecified laterality, unspecified osteoarthritis type Sherry Davila is to look for improvements in hip pain with weight loss.  6. Other depression, with emotional eating We will consider adding Wellbutrin at Sherry Davila's next visit.  7. Obesity, current BMI 37.4 Sherry Davila is currently in the action stage of change and her goal is to continue with weight loss efforts. I recommend Sherry Davila begin the structured treatment plan as follows:  She has agreed to the Category 2 Plan.  Exercise goals: As is.    Behavioral modification strategies: increasing lean protein intake, increasing water intake, decreasing alcohol intake, decreasing eating out, better snacking choices, and travel eating strategies.  She was informed of the importance of frequent follow-up visits to maximize her success with intensive lifestyle modifications for her multiple health conditions. She was informed we would discuss her lab results at her next visit unless there is a critical issue that needs to be addressed sooner. Sherry Davila agreed to  keep her next visit at the agreed upon time to discuss these results.  Objective:   Blood pressure (!) 145/75, pulse (!) 55, temperature 98.9 F (37.2 C), height '5\' 5"'$  (1.651 m), weight 224 lb (101.6 kg), SpO2 97 %. Body mass index is 37.28 kg/m.  EKG: Normal sinus rhythm, rate 51 BPM.  Indirect Calorimeter completed today shows a VO2 of 225 and a REE of 1555.  Her calculated basal metabolic rate is 1443 thus her basal metabolic rate is worse than expected.  General: Cooperative, alert, well developed, in no acute distress. HEENT: Conjunctivae and lids unremarkable. Cardiovascular: Regular rhythm.  Lungs: Normal work of breathing. Neurologic: No focal deficits.   Lab Results  Component Value Date   CREATININE 0.62 11/29/2021   BUN 20 11/29/2021   NA 138 11/29/2021   K 4.2 11/29/2021   CL 105 11/29/2021   CO2 28 11/29/2021   Lab Results  Component Value Date   ALT 12 11/29/2021   AST 13 11/29/2021   ALKPHOS 88 11/29/2021   BILITOT 0.5 11/29/2021   Lab Results  Component Value Date   HGBA1C 5.5 11/29/2021   HGBA1C 5.5 06/23/2021  HGBA1C 5.6 10/07/2020   No results found for: "INSULIN" Lab Results  Component Value Date   TSH 1.990 12/23/2021   Lab Results  Component Value Date   CHOL 245 (H) 11/29/2021   HDL 81.90 11/29/2021   LDLCALC 131 (H) 11/29/2021   TRIG 162.0 (H) 11/29/2021   CHOLHDL 3 11/29/2021   Lab Results  Component Value Date   WBC 5.9 08/16/2021   HGB 13.9 08/16/2021   HCT 41.6 08/16/2021   MCV 92.4 08/16/2021   PLT 258.0 08/16/2021   No results found for: "IRON", "TIBC", "FERRITIN"  Attestation Statements:   Reviewed by clinician on day of visit: allergies, medications, problem list, medical history, surgical history, family history, social history, and previous encounter notes.   Wilhemena Durie, am acting as transcriptionist for Loyal Gambler, DO.  I have reviewed the above documentation for accuracy and completeness, and I agree  with the above. Dell Ponto, DO

## 2021-12-30 DIAGNOSIS — M545 Low back pain, unspecified: Secondary | ICD-10-CM | POA: Diagnosis not present

## 2021-12-30 DIAGNOSIS — G8929 Other chronic pain: Secondary | ICD-10-CM | POA: Diagnosis not present

## 2021-12-31 NOTE — Telephone Encounter (Signed)
Sherry Davila ([name]) Rx #: [id number] [phone] 0.'25MG'$ /0.5ML auto-injectors   Form [employer] Electronic Prior Authorization Request Form (2017 NCPDP) Created 11 days ago Sent to Plan 10 days ago Plan Response 10 days ago Submit Clinical Questions 10 days ago Determination Unfavorable 10 days ago Message from [employer]. This drug is used for weight loss purposes, which is one of the classes not covered under the Part D coverage. We do not offer supplemental benefit that would cover this drug. Section 1927(d)(2) of the Social Security Act (the Act) permits the exclusion of certain drugs or classes of drugs from coverage under Part D.

## 2022-01-06 ENCOUNTER — Encounter (INDEPENDENT_AMBULATORY_CARE_PROVIDER_SITE_OTHER): Payer: Self-pay | Admitting: Family Medicine

## 2022-01-06 ENCOUNTER — Ambulatory Visit (INDEPENDENT_AMBULATORY_CARE_PROVIDER_SITE_OTHER): Payer: Medicare Other | Admitting: Family Medicine

## 2022-01-06 VITALS — BP 160/74 | HR 52 | Temp 98.3°F | Ht 65.0 in | Wt 226.0 lb

## 2022-01-06 DIAGNOSIS — Z9989 Dependence on other enabling machines and devices: Secondary | ICD-10-CM | POA: Diagnosis not present

## 2022-01-06 DIAGNOSIS — Z6837 Body mass index (BMI) 37.0-37.9, adult: Secondary | ICD-10-CM

## 2022-01-06 DIAGNOSIS — E669 Obesity, unspecified: Secondary | ICD-10-CM

## 2022-01-06 DIAGNOSIS — I10 Essential (primary) hypertension: Secondary | ICD-10-CM | POA: Diagnosis not present

## 2022-01-06 DIAGNOSIS — E559 Vitamin D deficiency, unspecified: Secondary | ICD-10-CM | POA: Diagnosis not present

## 2022-01-06 DIAGNOSIS — G4733 Obstructive sleep apnea (adult) (pediatric): Secondary | ICD-10-CM | POA: Diagnosis not present

## 2022-01-06 DIAGNOSIS — F3289 Other specified depressive episodes: Secondary | ICD-10-CM | POA: Diagnosis not present

## 2022-01-06 DIAGNOSIS — F32A Depression, unspecified: Secondary | ICD-10-CM | POA: Insufficient documentation

## 2022-01-06 MED ORDER — VITAMIN D (ERGOCALCIFEROL) 1.25 MG (50000 UNIT) PO CAPS: 50000.0000 [IU] | ORAL_CAPSULE | ORAL | 0 refills | Status: DC

## 2022-01-07 DIAGNOSIS — M545 Low back pain, unspecified: Secondary | ICD-10-CM | POA: Diagnosis not present

## 2022-01-07 DIAGNOSIS — G8929 Other chronic pain: Secondary | ICD-10-CM | POA: Diagnosis not present

## 2022-01-09 ENCOUNTER — Other Ambulatory Visit: Payer: Self-pay | Admitting: Internal Medicine

## 2022-01-09 DIAGNOSIS — I10 Essential (primary) hypertension: Secondary | ICD-10-CM

## 2022-01-17 NOTE — Progress Notes (Signed)
Chief Complaint:   OBESITY Sherry Davila is here to discuss her progress with her obesity treatment plan along with follow-up of her obesity related diagnoses. Sherry Davila is on the Category 2 Plan and states she is following her eating plan approximately 0% of the time. Sherry Davila states she is doing gym exercise for 60 minutes 3 times per week.  Today's visit was #: 2 Starting weight: 224 lbs Starting date: 12/23/2021 Today's weight: 226 lbs Today's date: 01/06/2022 Total lbs lost to date: 0 Total lbs lost since last in-office visit: 0  Interim History: Sherry Davila traveled to [place] and had friends in town.  She has more plans for socialization and babysitting.  She is doing cardio and weight training 3 times per week.  She started physical therapy.  Has a good support system.  She will not be able to start category 2 meal plan for 2 weeks.  Subjective:   1. Vitamin D deficiency Sherry Davila has a new diagnosis.  Her vitamin D level was low at 24.2 on 12/23/2021.  She is not on a supplement, and she is due for DEXA scan.  I discussed labs with the patient today.  2. Essential hypertension Sherry Davila's blood pressure is high today on amlodipine 5 mg daily and atenolol 150 mg daily and Hyzaar 100-12.5 mg daily.  3. OSA on CPAP Sherry Davila is using CPAP nightly and she is getting adequate sleep.  4. Other depression, with emotional eating Sherry Davila is on fluoxetine 40 mg daily.  Last PHQ-9 score was 24.  She denies stress eating.  Assessment/Plan:   1. Vitamin D deficiency Sherry Davila will begin prescription vitamin D 50,000 units once weekly, with no refills.  - Vitamin D, Ergocalciferol, (DRISDOL) 1.25 MG (50000 UNIT) CAPS capsule; Take 1 capsule (50,000 Units total) by mouth every 7 (seven) days.  Dispense: 5 capsule; Refill: 0  2. Essential hypertension Sherry Davila will continue her current medications, and she is to reduce high sodium foods.  3. OSA on CPAP Sherry Davila is to look for improvements with weight loss.  She is to use  her CPAP with 8 hours of sleep at night.  4. Other depression, with emotional eating Sherry Davila will continue fluoxetine per her PCP.  She will consider adding Wellbutrin.  5. Obesity, current BMI 37.7 Sherry Davila is currently in the action stage of change. As such, her goal is to continue with weight loss efforts. She has agreed to the Category 2 Plan.   Postpone next visit to 4 weeks.  Patient unable to start meal planning yet.  Exercise goals: As is.   Behavioral modification strategies: increasing lean protein intake, decreasing liquid calories, decreasing eating out, better snacking choices, and decreasing junk food.  Sherry Davila has agreed to follow-up with our clinic in 4 weeks. She was informed of the importance of frequent follow-up visits to maximize her success with intensive lifestyle modifications for her multiple health conditions.   Objective:   Blood pressure (!) 160/74, pulse (!) 52, temperature 98.3 F (36.8 C), height '5\' 5"'$  (1.651 m), weight 226 lb (102.5 kg), SpO2 97 %. Body mass index is 37.61 kg/m.  General: Cooperative, alert, well developed, in no acute distress. HEENT: Conjunctivae and lids unremarkable. Cardiovascular: Regular rhythm.  Lungs: Normal work of breathing. Neurologic: No focal deficits.   Lab Results  Component Value Date   CREATININE 0.62 11/29/2021   BUN 20 11/29/2021   NA 138 11/29/2021   K 4.2 11/29/2021   CL 105 11/29/2021   CO2 28 11/29/2021  Lab Results  Component Value Date   ALT 12 11/29/2021   AST 13 11/29/2021   ALKPHOS 88 11/29/2021   BILITOT 0.5 11/29/2021   Lab Results  Component Value Date   HGBA1C 5.5 11/29/2021   HGBA1C 5.5 06/23/2021   HGBA1C 5.6 10/07/2020   No results found for: "INSULIN" Lab Results  Component Value Date   TSH 1.990 12/23/2021   Lab Results  Component Value Date   CHOL 245 (H) 11/29/2021   HDL 81.90 11/29/2021   LDLCALC 131 (H) 11/29/2021   TRIG 162.0 (H) 11/29/2021   CHOLHDL 3 11/29/2021   Lab  Results  Component Value Date   VD25OH 24.2 (L) 12/23/2021   Lab Results  Component Value Date   WBC 5.9 08/16/2021   HGB 13.9 08/16/2021   HCT 41.6 08/16/2021   MCV 92.4 08/16/2021   PLT 258.0 08/16/2021   No results found for: "IRON", "TIBC", "FERRITIN"  Attestation Statements:   Reviewed by clinician on day of visit: allergies, medications, problem list, medical history, surgical history, family history, social history, and previous encounter notes.   Wilhemena Durie, am acting as transcriptionist for Loyal Gambler, DO.  I have reviewed the above documentation for accuracy and completeness, and I agree with the above. Dell Ponto, DO

## 2022-01-19 ENCOUNTER — Ambulatory Visit: Payer: Medicare Other | Admitting: Dermatology

## 2022-01-25 DIAGNOSIS — G8929 Other chronic pain: Secondary | ICD-10-CM | POA: Diagnosis not present

## 2022-01-25 DIAGNOSIS — M545 Low back pain, unspecified: Secondary | ICD-10-CM | POA: Diagnosis not present

## 2022-01-28 ENCOUNTER — Encounter: Payer: Self-pay | Admitting: Cardiology

## 2022-01-28 NOTE — Telephone Encounter (Signed)
Error

## 2022-02-01 ENCOUNTER — Ambulatory Visit (INDEPENDENT_AMBULATORY_CARE_PROVIDER_SITE_OTHER): Payer: Medicare Other | Admitting: Family Medicine

## 2022-02-01 ENCOUNTER — Encounter (INDEPENDENT_AMBULATORY_CARE_PROVIDER_SITE_OTHER): Payer: Self-pay | Admitting: Family Medicine

## 2022-02-01 VITALS — BP 181/91 | HR 58 | Temp 97.7°F | Ht 65.0 in | Wt 225.0 lb

## 2022-02-01 DIAGNOSIS — E559 Vitamin D deficiency, unspecified: Secondary | ICD-10-CM

## 2022-02-01 DIAGNOSIS — E669 Obesity, unspecified: Secondary | ICD-10-CM

## 2022-02-01 DIAGNOSIS — Z6837 Body mass index (BMI) 37.0-37.9, adult: Secondary | ICD-10-CM | POA: Diagnosis not present

## 2022-02-01 DIAGNOSIS — M545 Low back pain, unspecified: Secondary | ICD-10-CM | POA: Diagnosis not present

## 2022-02-01 DIAGNOSIS — G8929 Other chronic pain: Secondary | ICD-10-CM | POA: Diagnosis not present

## 2022-02-01 MED ORDER — VITAMIN D (ERGOCALCIFEROL) 1.25 MG (50000 UNIT) PO CAPS: 50000.0000 [IU] | ORAL_CAPSULE | ORAL | 0 refills | Status: DC

## 2022-02-07 NOTE — Progress Notes (Unsigned)
Chief Complaint:   OBESITY Rondi is here to discuss her progress with her obesity treatment plan along with follow-up of her obesity related diagnoses. Kaitlyn is on the Category 2 Plan and states she is following her eating plan approximately 0% of the time. Lessly states she is doing some, but has been on lots of vacations.   Today's visit was #: 3 Starting weight: 224 lbs Starting date: 12/23/2021 Today's weight: 225 lbs Today's date: 02/01/2022 Total lbs lost to date: 0 Total lbs lost since last in-office visit: 1 lb  Interim History: skips breakfast when getting up late.  Has eggs and [place] bacon at lunch.  Get in meat, fish, and veggies for dinner. Get in water. Denies night snacking.  Restarted the gym and [employer] platform.  Eating out on a regular basis,    Subjective:   1. Vitamin D deficiency Last Vitamin D level, 24.2.  She is currently taking prescription vitamin D 50,000 IU each week. She denies nausea, vomiting or muscle weakness.  Assessment/Plan:   1. Vitamin D deficiency Recheck level in 2 months  Refill - Vitamin D, Ergocalciferol, (DRISDOL) 1.25 MG (50000 UNIT) CAPS capsule; Take 1 capsule (50,000 Units total) by mouth every 7 (seven) days.  Dispense: 5 capsule; Refill: 0  2. Obesity,current BMI 37.5 1) Discussed the role of bariatric surgery.  2) Continue weight watchers.   Suzie is currently in the action stage of change. As such, her goal is to continue with weight loss efforts. She has agreed to keeping a food journal and adhering to recommended goals of weight watchers.    Exercise goals: All adults should avoid inactivity. Some physical activity is better than none, and adults who participate in any amount of physical activity gain some health benefits.  Behavioral modification strategies: increasing lean protein intake, increasing vegetables, increasing water intake, decreasing eating out, meal planning and cooking strategies, better  snacking choices, and decreasing junk food.  Doaa has agreed to follow-up with our clinic in 4 weeks. She was informed of the importance of frequent follow-up visits to maximize her success with intensive lifestyle modifications for her multiple health conditions.   Objective:   Blood pressure (!) 181/91, pulse (!) 58, temperature 97.7 F (36.5 C), height '5\' 5"'$  (1.651 m), weight 225 lb (102.1 kg), SpO2 97 %. Body mass index is 37.44 kg/m.  General: Cooperative, alert, well developed, in no acute distress. HEENT: Conjunctivae and lids unremarkable. Cardiovascular: Regular rhythm.  Lungs: Normal work of breathing. Neurologic: No focal deficits.   Lab Results  Component Value Date   CREATININE 0.62 11/29/2021   BUN 20 11/29/2021   NA 138 11/29/2021   K 4.2 11/29/2021   CL 105 11/29/2021   CO2 28 11/29/2021   Lab Results  Component Value Date   ALT 12 11/29/2021   AST 13 11/29/2021   ALKPHOS 88 11/29/2021   BILITOT 0.5 11/29/2021   Lab Results  Component Value Date   HGBA1C 5.5 11/29/2021   HGBA1C 5.5 06/23/2021   HGBA1C 5.6 10/07/2020   No results found for: "INSULIN" Lab Results  Component Value Date   TSH 1.990 12/23/2021   Lab Results  Component Value Date   CHOL 245 (H) 11/29/2021   HDL 81.90 11/29/2021   LDLCALC 131 (H) 11/29/2021   TRIG 162.0 (H) 11/29/2021   CHOLHDL 3 11/29/2021   Lab Results  Component Value Date   VD25OH 24.2 (L) 12/23/2021   Lab Results  Component Value Date   WBC 5.9 08/16/2021   HGB 13.9 08/16/2021   HCT 41.6 08/16/2021   MCV 92.4 08/16/2021   PLT 258.0 08/16/2021   No results found for: "IRON", "TIBC", "FERRITIN"  Obesity Behavioral Intervention:   Approximately 15 minutes were spent on the discussion below.  ASK: We discussed the diagnosis of obesity with Marcie Bal today and Shalondra agreed to give [place] permission to discuss obesity behavioral modification therapy today.  ASSESS: Shawntell has the diagnosis of obesity and her  BMI today is 37.5. Aniyla is in the action stage of change.   ADVISE: Argelia was educated on the multiple health risks of obesity as well as the benefit of weight loss to improve her health. She was advised of the need for long term treatment and the importance of lifestyle modifications to improve her current health and to decrease her risk of future health problems.  AGREE: Multiple dietary modification options and treatment options were discussed and Beatryce agreed to follow the recommendations documented in the above note.  ARRANGE: Cathlene was educated on the importance of frequent visits to treat obesity as outlined per CMS and USPSTF guidelines and agreed to schedule her next follow up appointment today.  Attestation Statements:   Reviewed by clinician on day of visit: allergies, medications, problem list, medical history, surgical history, family history, social history, and previous encounter notes.  I, Davy Pique, am acting as Location manager for Loyal Gambler, DO.  I have reviewed the above documentation for accuracy and completeness, and I agree with the above. Dell Ponto, DO

## 2022-02-09 ENCOUNTER — Other Ambulatory Visit (INDEPENDENT_AMBULATORY_CARE_PROVIDER_SITE_OTHER): Payer: Self-pay | Admitting: Family Medicine

## 2022-02-09 DIAGNOSIS — G8929 Other chronic pain: Secondary | ICD-10-CM | POA: Diagnosis not present

## 2022-02-09 DIAGNOSIS — E559 Vitamin D deficiency, unspecified: Secondary | ICD-10-CM

## 2022-02-09 DIAGNOSIS — M545 Low back pain, unspecified: Secondary | ICD-10-CM | POA: Diagnosis not present

## 2022-02-10 DIAGNOSIS — Z23 Encounter for immunization: Secondary | ICD-10-CM | POA: Diagnosis not present

## 2022-02-18 DIAGNOSIS — M545 Low back pain, unspecified: Secondary | ICD-10-CM | POA: Diagnosis not present

## 2022-02-18 DIAGNOSIS — G8929 Other chronic pain: Secondary | ICD-10-CM | POA: Diagnosis not present

## 2022-02-21 ENCOUNTER — Ambulatory Visit (INDEPENDENT_AMBULATORY_CARE_PROVIDER_SITE_OTHER): Payer: Medicare Other | Admitting: Internal Medicine

## 2022-02-21 ENCOUNTER — Encounter: Payer: Self-pay | Admitting: Internal Medicine

## 2022-02-21 DIAGNOSIS — M5442 Lumbago with sciatica, left side: Secondary | ICD-10-CM

## 2022-02-21 DIAGNOSIS — I10 Essential (primary) hypertension: Secondary | ICD-10-CM

## 2022-02-21 DIAGNOSIS — R739 Hyperglycemia, unspecified: Secondary | ICD-10-CM | POA: Diagnosis not present

## 2022-02-21 DIAGNOSIS — F320 Major depressive disorder, single episode, mild: Secondary | ICD-10-CM

## 2022-02-21 DIAGNOSIS — G4730 Sleep apnea, unspecified: Secondary | ICD-10-CM

## 2022-02-21 DIAGNOSIS — E785 Hyperlipidemia, unspecified: Secondary | ICD-10-CM

## 2022-02-21 NOTE — Progress Notes (Unsigned)
Patient ID: Sherry Davila, female   DOB: 21-Nov-1945, 76 y.o.   MRN: [id number]   Subjective:    Patient ID: Sherry Davila, female    DOB: [birth year], 76 y.o.   MRN: [id number]   Patient here for  Chief Complaint  Patient presents with   Follow-up    2 month follow up   .   HPI Here to follow up regarding increased stress and blood pressure.  She reports she is doing relatively well.  No chest pain or sob reported.  No abdominal pain reported.  Miralax - q day.  Has been attending PT for low back pain.  Persistent pain  - low back/buttock and left leg.  Plans f/u with ortho. Handling stress.    Past Medical History:  Diagnosis Date   Actinic keratosis 04/04/2018   L nose lat to supra tip   Anxiety    Basal cell carcinoma 04/04/2018   R prox nasal ala   Depression    Dysplastic nevus 05/07/2013   R abdomen - mod to severe, excision 05/29/2013   Hyperlipidemia    Hypertension    Obesity    Sleep apnea    has a cpap, doesn't wear    Varicose vein    Past Surgical History:  Procedure Laterality Date   COLONOSCOPY WITH PROPOFOL N/A 09/08/2016   Procedure: COLONOSCOPY WITH PROPOFOL;  Surgeon: Jonathon Bellows, MD;  Location: [facility] ENDOSCOPY;  Service: Endoscopy;  Laterality: N/A;   COLONOSCOPY WITH PROPOFOL N/A 01/07/2020   Procedure: COLONOSCOPY WITH PROPOFOL;  Surgeon: Jonathon Bellows, MD;  Location: [facility] ENDOSCOPY;  Service: Gastroenterology;  Laterality: N/A;   FRACTURE SURGERY     foot, screws placed   JOINT REPLACEMENT Right 2009   knee   Family History  Problem Relation Age of Onset   Cancer Mother    Heart failure Mother    Heart disease Mother    Breast cancer Mother 75   Heart attack Mother    Obesity Father    Depression Father    Early death Father    Hyperlipidemia Father    AAA (abdominal aortic aneurysm) Father    Hypertension Father    Heart disease Father    Hypertension Sister    Social History   Socioeconomic History   Marital status: Married     Spouse name: Not on file   Number of children: Not on file   Years of education: Not on file   Highest education level: Not on file  Occupational History   Not on file  Tobacco Use   Smoking status: Former    Types: Cigarettes    Quit date: 01/19/1988    Years since quitting: 34.1   Smokeless tobacco: Never  Vaping Use   Vaping Use: Never used  Substance and Sexual Activity   Alcohol use: Yes    Alcohol/week: 3.0 standard drinks of alcohol    Types: 3 Shots of liquor per week    Comment: 3 drinks a week socially    Drug use: No   Sexual activity: Not on file  Other Topics Concern   Not on file  Social History Narrative   Not on file   Social Determinants of Health   Financial Resource Strain: Low Risk  (10/11/2021)   Overall Financial Resource Strain (CARDIA)    Difficulty of Paying Living Expenses: Not hard at all  Food Insecurity: No Food Insecurity (10/11/2021)   Hunger Vital Sign    Worried About Running Out  of Food in the Last Year: Never true    [place] in the Last Year: Never true  Transportation Needs: No Transportation Needs (10/11/2021)   PRAPARE - Hydrologist (Medical): No    Lack of Transportation (Non-Medical): No  Physical Activity: Insufficiently Active (10/11/2021)   Exercise Vital Sign    Days of Exercise per Week: 3 days    Minutes of Exercise per Session: 30 min  Stress: No Stress Concern Present (10/11/2021)   [place]    Feeling of Stress : Not at all  Social Connections: Moderately Integrated (10/11/2021)   Social Connection and Isolation Panel [NHANES]    Frequency of Communication with Friends and Family: Never    Frequency of Social Gatherings with Friends and Family: More than three times a week    Attends Religious Services: More than 4 times per year    Active Member of [employer] or Organizations: No    Attends Archivist Meetings:  Never    Marital Status: Married     Review of Systems  Constitutional:  Negative for appetite change and unexpected weight change.  HENT:  Negative for congestion and sinus pressure.   Respiratory:  Negative for cough, chest tightness and shortness of breath.   Cardiovascular:  Negative for chest pain, palpitations and leg swelling.  Gastrointestinal:  Negative for abdominal pain, diarrhea, nausea and vomiting.  Genitourinary:  Negative for difficulty urinating and dysuria.  Musculoskeletal:  Negative for joint swelling and myalgias.       Low back pain and left leg pain as outlined.   Skin:  Negative for color change and rash.  Neurological:  Negative for dizziness, light-headedness and headaches.  Psychiatric/Behavioral:  Negative for agitation and dysphoric mood.        Objective:     BP 128/72   Pulse 75   Temp 99.5 F (37.5 C) (Oral)   Ht '5\' 5"'  (1.651 m)   Wt 230 lb 3.2 oz (104.4 kg)   SpO2 95%   BMI 38.31 kg/m  Wt Readings from Last 3 Encounters:  02/21/22 230 lb 3.2 oz (104.4 kg)  02/01/22 225 lb (102.1 kg)  01/06/22 226 lb (102.5 kg)    Physical Exam Vitals reviewed.  Constitutional:      General: She is not in acute distress.    Appearance: Normal appearance.  HENT:     Head: Normocephalic and atraumatic.     Right Ear: External ear normal.     Left Ear: External ear normal.  Eyes:     General: No scleral icterus.       Right eye: No discharge.        Left eye: No discharge.     Conjunctiva/sclera: Conjunctivae normal.  Neck:     Thyroid: No thyromegaly.  Cardiovascular:     Rate and Rhythm: Normal rate and regular rhythm.  Pulmonary:     Effort: No respiratory distress.     Breath sounds: Normal breath sounds. No wheezing.  Abdominal:     General: Bowel sounds are normal.     Palpations: Abdomen is soft.     Tenderness: There is no abdominal tenderness.  Musculoskeletal:        General: No swelling or tenderness.     Cervical back: Neck  supple. No tenderness.  Lymphadenopathy:     Cervical: No cervical adenopathy.  Skin:    Findings: No erythema or  rash.  Neurological:     Mental Status: She is alert.  Psychiatric:        Mood and Affect: Mood normal.        Behavior: Behavior normal.      Outpatient Encounter Medications as of 02/21/2022  Medication Sig   amLODipine (NORVASC) 5 MG tablet TAKE ONE TABLET BY MOUTH DAILY   atenolol (TENORMIN) 50 MG tablet TAKE ONE TABLET BY MOUTH DAILY   FLUoxetine (PROZAC) 40 MG capsule TAKE ONE CAPSULE BY MOUTH DAILY   losartan-hydrochlorothiazide (HYZAAR) 100-12.5 MG tablet TAKE ONE TABLET BY MOUTH DAILY   Vitamin D, Ergocalciferol, (DRISDOL) 1.25 MG (50000 UNIT) CAPS capsule Take 1 capsule (50,000 Units total) by mouth every 7 (seven) days.   No facility-administered encounter medications on file as of 02/21/2022.     Lab Results  Component Value Date   WBC 5.9 08/16/2021   HGB 13.9 08/16/2021   HCT 41.6 08/16/2021   PLT 258.0 08/16/2021   GLUCOSE 95 11/29/2021   CHOL 245 (H) 11/29/2021   TRIG 162.0 (H) 11/29/2021   HDL 81.90 11/29/2021   LDLCALC 131 (H) 11/29/2021   ALT 12 11/29/2021   AST 13 11/29/2021   NA 138 11/29/2021   K 4.2 11/29/2021   CL 105 11/29/2021   CREATININE 0.62 11/29/2021   BUN 20 11/29/2021   CO2 28 11/29/2021   TSH 1.990 12/23/2021   HGBA1C 5.5 11/29/2021    MM 3D SCREEN BREAST BILATERAL  Result Date: 10/15/2021 CLINICAL DATA:  Screening. EXAM: DIGITAL SCREENING BILATERAL MAMMOGRAM WITH TOMOSYNTHESIS AND CAD TECHNIQUE: Bilateral screening digital craniocaudal and mediolateral oblique mammograms were obtained. Bilateral screening digital breast tomosynthesis was performed. The images were evaluated with computer-aided detection. COMPARISON:  Previous exam(s). ACR Breast Density Category b: There are scattered areas of fibroglandular density. FINDINGS: There are no findings suspicious for malignancy. IMPRESSION: No mammographic evidence of  malignancy. A result letter of this screening mammogram will be mailed directly to the patient. RECOMMENDATION: Screening mammogram in one year. (Code:SM-B-01Y) BI-RADS CATEGORY  1: Negative. Electronically Signed   By: Dorise Bullion III M.D.   On: 10/15/2021 14:37       Assessment & Plan:   Problem List Items Addressed This Visit     Hyperglycemia    Low carb diet and exercise. Follow met b and a1c.  Lab Results  Component Value Date   HGBA1C 5.5 11/29/2021       Relevant Orders   Hemoglobin A1c   Hyperlipidemia    The 10-year ASCVD risk score (Arnett DK, et al., 2019) is: 17.2%   Values used to calculate the score:     Age: 65 years     Sex: Female     Is Non-Hispanic African American: No     Diabetic: No     Tobacco smoker: No     Systolic Blood Pressure: 013 mmHg     Is BP treated: Yes     HDL Cholesterol: 81.9 mg/dL     Total Cholesterol: 245 mg/dL  Have discussed calculated cholesterol risk.  Discussed recommendation to start cholesterol medication.  Has wanted to work on diet and exercise.  Follow lipid panel.       Relevant Orders   Hepatic function panel   Lipid panel   Hypertension    Continue losartan/hctz, amlodipine and atenolol.  Blood pressure recheck by me:  128/72. Follow pressures.  Follow metabolic panel. Hold on making changes in medication.  Relevant Orders   Basic metabolic panel   Low back pain    Persistent low back pain and left leg pain.  Has been to PT.  F/u with ortho.       Major depressive disorder, single episode, mild (HCC)    Continues on prozac.  Overall she feels she is doing ok.  Appears to be doing better. Follow.       Sleep apnea    CPAP.         Einar Pheasant, MD

## 2022-02-24 ENCOUNTER — Encounter: Payer: Self-pay | Admitting: Internal Medicine

## 2022-02-24 DIAGNOSIS — M47816 Spondylosis without myelopathy or radiculopathy, lumbar region: Secondary | ICD-10-CM | POA: Diagnosis not present

## 2022-02-24 NOTE — Assessment & Plan Note (Signed)
Continue losartan/hctz, amlodipine and atenolol.  Blood pressure recheck by me:  128/72. Follow pressures.  Follow metabolic panel. Hold on making changes in medication.

## 2022-02-24 NOTE — Assessment & Plan Note (Signed)
CPAP.  

## 2022-02-24 NOTE — Assessment & Plan Note (Signed)
Continues on prozac.  Overall she feels she is doing ok.  Appears to be doing better. Follow.  

## 2022-02-24 NOTE — Assessment & Plan Note (Addendum)
The 10-year ASCVD risk score (Arnett DK, et al., 2019) is: 17.2%   Values used to calculate the score:     Age: 76 years     Sex: Female     Is Non-Hispanic African American: No     Diabetic: No     Tobacco smoker: No     Systolic Blood Pressure: 677 mmHg     Is BP treated: Yes     HDL Cholesterol: 81.9 mg/dL     Total Cholesterol: 245 mg/dL  Have discussed calculated cholesterol risk.  Discussed recommendation to start cholesterol medication.  Has wanted to work on diet and exercise.  Follow lipid panel.

## 2022-02-24 NOTE — Assessment & Plan Note (Signed)
Low carb diet and exercise. Follow met b and a1c.  Lab Results  Component Value Date   HGBA1C 5.5 11/29/2021   

## 2022-02-24 NOTE — Assessment & Plan Note (Signed)
Persistent low back pain and left leg pain.  Has been to PT.  F/u with ortho.

## 2022-02-28 ENCOUNTER — Other Ambulatory Visit: Payer: Self-pay | Admitting: Orthopedic Surgery

## 2022-02-28 DIAGNOSIS — M4306 Spondylolysis, lumbar region: Secondary | ICD-10-CM

## 2022-03-01 ENCOUNTER — Encounter (INDEPENDENT_AMBULATORY_CARE_PROVIDER_SITE_OTHER): Payer: Self-pay | Admitting: Family Medicine

## 2022-03-01 ENCOUNTER — Ambulatory Visit (INDEPENDENT_AMBULATORY_CARE_PROVIDER_SITE_OTHER): Payer: Medicare Other | Admitting: Family Medicine

## 2022-03-01 VITALS — BP 147/71 | HR 57 | Temp 98.8°F | Ht 65.0 in | Wt 224.0 lb

## 2022-03-01 DIAGNOSIS — E669 Obesity, unspecified: Secondary | ICD-10-CM

## 2022-03-01 DIAGNOSIS — M549 Dorsalgia, unspecified: Secondary | ICD-10-CM

## 2022-03-01 DIAGNOSIS — G8929 Other chronic pain: Secondary | ICD-10-CM

## 2022-03-01 DIAGNOSIS — E559 Vitamin D deficiency, unspecified: Secondary | ICD-10-CM

## 2022-03-01 DIAGNOSIS — Z6837 Body mass index (BMI) 37.0-37.9, adult: Secondary | ICD-10-CM | POA: Diagnosis not present

## 2022-03-01 DIAGNOSIS — I10 Essential (primary) hypertension: Secondary | ICD-10-CM | POA: Diagnosis not present

## 2022-03-01 MED ORDER — VITAMIN D (ERGOCALCIFEROL) 1.25 MG (50000 UNIT) PO CAPS: 50000.0000 [IU] | ORAL_CAPSULE | ORAL | 0 refills | Status: DC

## 2022-03-02 DIAGNOSIS — G8929 Other chronic pain: Secondary | ICD-10-CM | POA: Insufficient documentation

## 2022-03-03 DIAGNOSIS — H353132 Nonexudative age-related macular degeneration, bilateral, intermediate dry stage: Secondary | ICD-10-CM | POA: Diagnosis not present

## 2022-03-08 ENCOUNTER — Other Ambulatory Visit (INDEPENDENT_AMBULATORY_CARE_PROVIDER_SITE_OTHER): Payer: Self-pay | Admitting: Family Medicine

## 2022-03-08 DIAGNOSIS — E559 Vitamin D deficiency, unspecified: Secondary | ICD-10-CM

## 2022-03-09 NOTE — Progress Notes (Unsigned)
Chief Complaint:   OBESITY Sherry Davila is here to discuss her progress with her obesity treatment plan along with follow-up of her obesity related diagnoses. Sherry Davila is on keeping a food journal but using weight watchers program and states she is following her eating plan approximately 15-20% of the time. Sherry Davila states she is not exercising.   Today's visit was #: 4 Starting weight: 224 lbs Starting date: 12/23/2021 Today's weight: 225 lbs Today's date: 03/01/2022 Total lbs lost to date: 1 lb Total lbs lost since last in-office visit: 1 lb  Interim History: For the next 2 weeks, she will not be eating out much.  Gets off track with socialization.  Had several celebrations since last office visit.  She is consuming Gin a few nights per week.  She is frustrated by the lack of results, although she has not been exercising due to hip and back pain.   Subjective:   1. Vitamin D deficiency She is currently taking prescription vitamin D 50,000 IU each week. She denies nausea, vomiting or muscle weakness.  Energy levels improving.   2. Essential hypertension Her blood pressure was elevated today.  She is taking Norvasc 5 mg daily and Atenolol 50 mg daily, losartan/HCTZ 100/12.5 mg daily.   3. Chronic back pain, unspecified back location, unspecified back pain laterality Her pain limits her exercise, which is a barrier to weight loss progress.    Assessment/Plan:   1. Vitamin D deficiency Recheck Vitamin D level in 2-3 months.   Refill - Vitamin D, Ergocalciferol, (DRISDOL) 1.25 MG (50000 UNIT) CAPS capsule; Take 1 capsule (50,000 Units total) by mouth every 7 (seven) days.  Dispense: 10 capsule; Refill: 0  2. Essential hypertension Avoid use of phentermine for the treatment of obesity.   3. Chronic back pain, unspecified back location, unspecified back pain laterality Recommend yoga, bike, water exercise as tolerated.   4. Obesity,current BMI 37.3 1) Discussed the role of weight loss  surgery. 2) Given contact information for [facility].   Sherry Davila is currently in the action stage of change. As such, her goal is to continue with weight loss efforts. She has agreed to keeping a food journal and adhering to recommended goals of the weight watchers program.    Exercise goals: All adults should avoid inactivity. Some physical activity is better than none, and adults who participate in any amount of physical activity gain some health benefits.  Behavioral modification strategies: increasing lean protein intake, increasing vegetables, increasing water intake, decreasing alcohol intake, increasing high fiber foods, decreasing eating out, no skipping meals, meal planning and cooking strategies, better snacking choices, and keeping a strict food journal.  Sherry Davila has agreed to follow-up with our clinic in 8 weeks. She was informed of the importance of frequent follow-up visits to maximize her success with intensive lifestyle modifications for her multiple health conditions.   Objective:   Blood pressure (!) 147/71, pulse (!) 57, temperature 98.8 F (37.1 C), height '5\' 5"'$  (1.651 m), weight 224 lb (101.6 kg), SpO2 96 %. Body mass index is 37.28 kg/m.  General: Cooperative, alert, well developed, in no acute distress. HEENT: Conjunctivae and lids unremarkable. Cardiovascular: Regular rhythm.  Lungs: Normal work of breathing. Neurologic: No focal deficits.   Lab Results  Component Value Date   CREATININE 0.62 11/29/2021   BUN 20 11/29/2021   NA 138 11/29/2021   K 4.2 11/29/2021   CL 105 11/29/2021   CO2 28 11/29/2021   Lab Results  Component  Value Date   ALT 12 11/29/2021   AST 13 11/29/2021   ALKPHOS 88 11/29/2021   BILITOT 0.5 11/29/2021   Lab Results  Component Value Date   HGBA1C 5.5 11/29/2021   HGBA1C 5.5 06/23/2021   HGBA1C 5.6 10/07/2020   No results found for: "INSULIN" Lab Results  Component Value Date   TSH 1.990 12/23/2021   Lab Results   Component Value Date   CHOL 245 (H) 11/29/2021   HDL 81.90 11/29/2021   LDLCALC 131 (H) 11/29/2021   TRIG 162.0 (H) 11/29/2021   CHOLHDL 3 11/29/2021   Lab Results  Component Value Date   VD25OH 24.2 (L) 12/23/2021   Lab Results  Component Value Date   WBC 5.9 08/16/2021   HGB 13.9 08/16/2021   HCT 41.6 08/16/2021   MCV 92.4 08/16/2021   PLT 258.0 08/16/2021   No results found for: "IRON", "TIBC", "FERRITIN"  Attestation Statements:   Reviewed by clinician on day of visit: allergies, medications, problem list, medical history, surgical history, family history, social history, and previous encounter notes.  Time spent on visit including pre-visit chart review and post-visit care and charting was 30 minutes.   I, Davy Pique, am acting as Location manager for Loyal Gambler, DO.  I have reviewed the above documentation for accuracy and completeness, and I agree with the above. Dell Ponto, DO

## 2022-03-11 ENCOUNTER — Ambulatory Visit
Admission: RE | Admit: 2022-03-11 | Discharge: 2022-03-11 | Disposition: A | Discharge status: Home / Self Care | Payer: Medicare Other | Source: Ambulatory Visit | Attending: Orthopedic Surgery | Admitting: Orthopedic Surgery

## 2022-03-11 DIAGNOSIS — M47816 Spondylosis without myelopathy or radiculopathy, lumbar region: Secondary | ICD-10-CM | POA: Diagnosis not present

## 2022-03-11 DIAGNOSIS — M5137 Other intervertebral disc degeneration, lumbosacral region: Secondary | ICD-10-CM | POA: Diagnosis not present

## 2022-03-11 DIAGNOSIS — M4306 Spondylolysis, lumbar region: Secondary | ICD-10-CM

## 2022-03-11 DIAGNOSIS — M4316 Spondylolisthesis, lumbar region: Secondary | ICD-10-CM | POA: Diagnosis not present

## 2022-03-11 DIAGNOSIS — M545 Low back pain, unspecified: Secondary | ICD-10-CM | POA: Diagnosis not present

## 2022-03-22 ENCOUNTER — Encounter: Payer: Self-pay | Admitting: Internal Medicine

## 2022-03-22 ENCOUNTER — Telehealth: Payer: Self-pay | Admitting: Cardiology

## 2022-03-22 DIAGNOSIS — M6281 Muscle weakness (generalized): Secondary | ICD-10-CM | POA: Diagnosis not present

## 2022-03-22 DIAGNOSIS — Z6837 Body mass index (BMI) 37.0-37.9, adult: Secondary | ICD-10-CM | POA: Diagnosis not present

## 2022-03-22 DIAGNOSIS — E785 Hyperlipidemia, unspecified: Secondary | ICD-10-CM | POA: Diagnosis not present

## 2022-03-22 DIAGNOSIS — R7309 Other abnormal glucose: Secondary | ICD-10-CM | POA: Diagnosis not present

## 2022-03-22 DIAGNOSIS — E039 Hypothyroidism, unspecified: Secondary | ICD-10-CM | POA: Diagnosis not present

## 2022-03-22 DIAGNOSIS — R7989 Other specified abnormal findings of blood chemistry: Secondary | ICD-10-CM | POA: Diagnosis not present

## 2022-03-22 DIAGNOSIS — E669 Obesity, unspecified: Secondary | ICD-10-CM | POA: Diagnosis not present

## 2022-03-22 DIAGNOSIS — F1511 Other stimulant abuse, in remission: Secondary | ICD-10-CM | POA: Diagnosis not present

## 2022-03-22 DIAGNOSIS — Z713 Dietary counseling and surveillance: Secondary | ICD-10-CM | POA: Diagnosis not present

## 2022-03-22 DIAGNOSIS — E538 Deficiency of other specified B group vitamins: Secondary | ICD-10-CM | POA: Diagnosis not present

## 2022-03-22 DIAGNOSIS — R29898 Other symptoms and signs involving the musculoskeletal system: Secondary | ICD-10-CM | POA: Diagnosis not present

## 2022-03-22 DIAGNOSIS — G4730 Sleep apnea, unspecified: Secondary | ICD-10-CM | POA: Diagnosis not present

## 2022-03-22 DIAGNOSIS — E559 Vitamin D deficiency, unspecified: Secondary | ICD-10-CM | POA: Diagnosis not present

## 2022-03-22 DIAGNOSIS — I10 Essential (primary) hypertension: Secondary | ICD-10-CM | POA: Diagnosis not present

## 2022-03-22 DIAGNOSIS — E611 Iron deficiency: Secondary | ICD-10-CM | POA: Diagnosis not present

## 2022-03-22 DIAGNOSIS — Z79899 Other long term (current) drug therapy: Secondary | ICD-10-CM | POA: Diagnosis not present

## 2022-03-22 DIAGNOSIS — Z01818 Encounter for other preprocedural examination: Secondary | ICD-10-CM | POA: Diagnosis not present

## 2022-03-22 DIAGNOSIS — Z7689 Persons encountering health services in other specified circumstances: Secondary | ICD-10-CM | POA: Diagnosis not present

## 2022-03-22 NOTE — Telephone Encounter (Signed)
   Name: Sherry Davila  DOB: Jul 04, 1945  MRN: [id number]  Primary Cardiologist: Kate Sable, MD  Chart reviewed as part of pre-operative protocol coverage. Because of [name]'s past medical history and time since last visit, she will require a follow-up in-office visit in order to better assess preoperative cardiovascular risk.  Pre-op covering staff: - Please schedule appointment and call patient to inform them. If patient already had an upcoming appointment within acceptable timeframe, please add "pre-op clearance" to the appointment notes so provider is aware. - Please contact requesting surgeon's office via preferred method (i.e, phone, fax) to inform them of need for appointment prior to surgery.    Christell Faith, PA-C  03/22/2022, 4:28 PM

## 2022-03-22 NOTE — Telephone Encounter (Signed)
Left message for the pt to call back and schedule an in office appt with Dr. Charlestine Night or APP for pre op clearance.

## 2022-03-22 NOTE — Telephone Encounter (Signed)
   Pre-operative Risk Assessment    Patient Name: Sherry Davila  DOB: 14-Sep-1945 MRN: [id number]      Request for Surgical Clearance    Procedure:   bariatric surgery  Date of Surgery:  Clearance TBD                                 Surgeon:  not indicated Surgeon's Group or Practice Name:  [facility] Phone number:  [phone] Fax number:  [phone]   Type of Clearance Requested:   - Medical    Type of Anesthesia:  Not Indicated   Additional requests/questions:    Manfred Arch   03/22/2022, 4:25 PM

## 2022-03-23 NOTE — Telephone Encounter (Signed)
Pt agreeable to in office appt. Pt has been scheduled to see Dr. Charlestine Night 03/24/22 @ 8:50 in the [place] office. Pt thanked me for the help. I will update all parties involved.

## 2022-03-24 ENCOUNTER — Ambulatory Visit: Payer: Medicare Other | Attending: Cardiology | Admitting: Cardiology

## 2022-03-24 ENCOUNTER — Encounter: Payer: Self-pay | Admitting: Cardiology

## 2022-03-24 VITALS — BP 122/78 | HR 97 | Ht 65.0 in | Wt 231.8 lb

## 2022-03-24 DIAGNOSIS — Z01810 Encounter for preprocedural cardiovascular examination: Secondary | ICD-10-CM | POA: Diagnosis not present

## 2022-03-24 DIAGNOSIS — E7800 Pure hypercholesterolemia, unspecified: Secondary | ICD-10-CM | POA: Diagnosis not present

## 2022-03-24 DIAGNOSIS — I10 Essential (primary) hypertension: Secondary | ICD-10-CM | POA: Diagnosis not present

## 2022-03-24 DIAGNOSIS — Z6838 Body mass index (BMI) 38.0-38.9, adult: Secondary | ICD-10-CM | POA: Diagnosis not present

## 2022-03-24 MED ORDER — AMLODIPINE BESYLATE 5 MG PO TABS: 5.0000 mg | ORAL_TABLET | Freq: Every day | ORAL | 3 refills | Status: DC

## 2022-03-24 MED ORDER — ATENOLOL 25 MG PO TABS: 25.0000 mg | ORAL_TABLET | Freq: Every day | ORAL | 3 refills | Status: DC

## 2022-03-24 MED ORDER — EZETIMIBE 10 MG PO TABS: 10.0000 mg | ORAL_TABLET | Freq: Every day | ORAL | 11 refills | Status: DC

## 2022-03-24 NOTE — Progress Notes (Signed)
Cardiology Office Note:    Date:  03/24/2022   ID:  Sherry Davila, DOB Aug 13, 1945, MRN [id number]  PCP:  Sherry Pheasant, MD  [facility] Cardiologist:  Sherry Sable, MD  [facility] Electrophysiologist:  None   Referring MD: Sherry Pheasant, MD   Chief Complaint  Patient presents with   Pre-op Exam    Clearance,  bariatric surgery, SOB(believes due to weight)    History of Present Illness:    Sherry Davila is a 76 y.o. female with a hx of anxiety, hypertension, hyperlipidemia, obesity, sleep apnea, former smoker x20 years who presents for preop evaluation.  Patient is planning to have bariatric surgery in the near future.  Cardiac risk stratification requested.  Denies chest pain, has chronic shortness of breath which has not changed over the past year or so.  Previously evaluated by myself due to symptoms of shortness of breath.  Work-up included echo and stress testing with [facility] which were both unrevealing.  Dyspnea attributed to morbid obesity, which patient is trying to address with surgery.  Compliant with medications as prescribed, overall feels well, has no concerns at this time.  Was previously on statins, developed muscle aches, she stopped taking.   Prior notes Echo 08/8183, normal systolic function EF 60 to 63%, grade 2 diastolic dysfunction. Lexiscan Myoview 10/2019, no evidence for ischemia.   Past Medical History:  Diagnosis Date   Actinic keratosis 04/04/2018   L nose lat to supra tip   Anxiety    Basal cell carcinoma 04/04/2018   R prox nasal ala   Depression    Dysplastic nevus 05/07/2013   R abdomen - mod to severe, excision 05/29/2013   Hyperlipidemia    Hypertension    Obesity    Sleep apnea    has a cpap, doesn't wear    Varicose vein     Past Surgical History:  Procedure Laterality Date   COLONOSCOPY WITH PROPOFOL N/A 09/08/2016   Procedure: COLONOSCOPY WITH PROPOFOL;  Surgeon: Jonathon Bellows, MD;  Location: [facility]  ENDOSCOPY;  Service: Endoscopy;  Laterality: N/A;   COLONOSCOPY WITH PROPOFOL N/A 01/07/2020   Procedure: COLONOSCOPY WITH PROPOFOL;  Surgeon: Jonathon Bellows, MD;  Location: [facility] ENDOSCOPY;  Service: Gastroenterology;  Laterality: N/A;   FRACTURE SURGERY     foot, screws placed   JOINT REPLACEMENT Right 2009   knee    Current Medications: Current Meds  Medication Sig   ezetimibe (ZETIA) 10 MG tablet Take 1 tablet (10 mg total) by mouth daily.   FLUoxetine (PROZAC) 40 MG capsule TAKE ONE CAPSULE BY MOUTH DAILY   losartan-hydrochlorothiazide (HYZAAR) 100-12.5 MG tablet TAKE ONE TABLET BY MOUTH DAILY   Vitamin D, Ergocalciferol, (DRISDOL) 1.25 MG (50000 UNIT) CAPS capsule Take 1 capsule (50,000 Units total) by mouth every 7 (seven) days.   [DISCONTINUED] amLODipine (NORVASC) 5 MG tablet TAKE ONE TABLET BY MOUTH DAILY   [DISCONTINUED] atenolol (TENORMIN) 50 MG tablet TAKE ONE TABLET BY MOUTH DAILY     Allergies:   Patient has no known allergies.   Social History   Socioeconomic History   Marital status: Married    Spouse name: Not on file   Number of children: Not on file   Years of education: Not on file   Highest education level: Not on file  Occupational History   Not on file  Tobacco Use   Smoking status: Former    Types: Cigarettes    Quit date: 01/19/1988    Years since quitting:  34.2   Smokeless tobacco: Never  Vaping Use   Vaping Use: Never used  Substance and Sexual Activity   Alcohol use: Yes    Alcohol/week: 3.0 standard drinks of alcohol    Types: 3 Shots of liquor per week    Comment: 3 drinks a week socially    Drug use: No   Sexual activity: Not on file  Other Topics Concern   Not on file  Social History Narrative   Not on file   Social Determinants of Health   Financial Resource Strain: Low Risk  (10/11/2021)   Overall Financial Resource Strain (CARDIA)    Difficulty of Paying Living Expenses: Not hard at all  Food Insecurity: No Food Insecurity  (10/11/2021)   Hunger Vital Sign    Worried About Running Out of Food in the Last Year: Never true    Ran Out of Food in the Last Year: Never true  Transportation Needs: No Transportation Needs (10/11/2021)   PRAPARE - Hydrologist (Medical): No    Lack of Transportation (Non-Medical): No  Physical Activity: Insufficiently Active (10/11/2021)   Exercise Vital Sign    Days of Exercise per Week: 3 days    Minutes of Exercise per Session: 30 min  Stress: No Stress Concern Present (10/11/2021)   [place]    Feeling of Stress : Not at all  Social Connections: Moderately Integrated (10/11/2021)   Social Connection and Isolation Panel [NHANES]    Frequency of Communication with Friends and Family: Never    Frequency of Social Gatherings with Friends and Family: More than three times a week    Attends Religious Services: More than 4 times per year    Active Member of [employer] or Organizations: No    Attends Music therapist: Never    Marital Status: Married     Family History: The patient's family history includes AAA (abdominal aortic aneurysm) in her father; Breast cancer (age of onset: 11) in her mother; Cancer in her mother; Depression in her father; Early death in her father; Heart attack in her mother; Heart disease in her father and mother; Heart failure in her mother; Hyperlipidemia in her father; Hypertension in her father and sister; Obesity in her father.  ROS:   Please see the history of present illness.     All other systems reviewed and are negative.  EKGs/Labs/Other Studies Reviewed:    The following studies were reviewed today:   EKG:  EKG is  ordered today.  The ekg ordered today demonstrates sinus bradycardia, heart rate 53  Recent Labs: 08/16/2021: Hemoglobin 13.9; Platelets 258.0 11/29/2021: ALT 12; BUN 20; Creatinine, Ser 0.62; Potassium 4.2; Sodium 138 12/23/2021:  TSH 1.990  Recent Lipid Panel    Component Value Date/Time   CHOL 245 (H) 11/29/2021 0752   CHOL 177 04/09/2018 1124   TRIG 162.0 (H) 11/29/2021 0752   TRIG 83 04/09/2018 1124   HDL 81.90 11/29/2021 0752   HDL 109 09/28/2017 1557   CHOLHDL 3 11/29/2021 0752   VLDL 32.4 11/29/2021 0752   VLDL 17 04/09/2018 1124   LDLCALC 131 (H) 11/29/2021 0752   LDLCALC 110 (H) 09/28/2017 1557    Physical Exam:    VS:  BP 122/78 (BP Location: Left Arm, Patient Position: Sitting, Cuff Size: Normal)   Pulse 97   Ht '5\' 5"'$  (1.651 m)   Wt 231 lb 12.8 oz (105.1 kg)   SpO2  97%   BMI 38.57 kg/m     Wt Readings from Last 3 Encounters:  03/24/22 231 lb 12.8 oz (105.1 kg)  03/01/22 224 lb (101.6 kg)  02/21/22 230 lb 3.2 oz (104.4 kg)     GEN:  Well nourished, well developed in no acute distress HEENT: Normal NECK: No JVD; No carotid bruits CARDIAC: RRR, no murmurs, rubs, gallops RESPIRATORY:  Clear to auscultation without rales, wheezing or rhonchi  ABDOMEN: Soft, non-tender, non-distended MUSCULOSKELETAL:  No edema; No deformity  SKIN: Warm and dry NEUROLOGIC:  Alert and oriented x 3 PSYCHIATRIC:  Normal affect   ASSESSMENT:    1. Pre-operative cardiovascular examination   2. Primary hypertension   3. BMI 38.0-38.9,adult   4. Pure hypercholesterolemia   5. Essential hypertension    PLAN:    In order of problems listed above:  Preop evaluation, weight loss surgery being planned.  Echo 12/2019 normal EF 60 to 65%, Lexiscan Myoview was normal.  No new symptoms, denies chest pain. shortness of breath previously evaluated with echo and Myoview as above.  Obesity likely reason for shortness of breath.  Okay to proceed with procedure from a cardiac perspective. hypertension, blood pressure controlled.  Continue losartan, HCTZ, Norvasc.  Bradycardia, reduce atenolol to 25 mg daily. Obesity, low-calorie diet, planning on weight loss surgery. Hyperlipidemia, not tolerant to statins, start Zetia  10 mg daily.  Repeat lipid panel in 3 to 6 months  Follow-up in 12 months.  Total encounter time 40 minutes  Greater than 50% was spent in counseling and coordination of care with the patient   This note was generated in part or whole with voice recognition software. Voice recognition is usually quite accurate but there are transcription errors that can and very often do occur. I apologize for any typographical errors that were not detected and corrected.  Medication Adjustments/Labs and Tests Ordered: Current medicines are reviewed at length with the patient today.  Concerns regarding medicines are outlined above.  Orders Placed This Encounter  Procedures   Lipid panel   EKG 12-Lead   Meds ordered this encounter  Medications   atenolol (TENORMIN) 25 MG tablet    Sig: Take 1 tablet (25 mg total) by mouth daily.    Dispense:  90 tablet    Refill:  3   amLODipine (NORVASC) 5 MG tablet    Sig: Take 1 tablet (5 mg total) by mouth daily.    Dispense:  90 tablet    Refill:  3   ezetimibe (ZETIA) 10 MG tablet    Sig: Take 1 tablet (10 mg total) by mouth daily.    Dispense:  30 tablet    Refill:  11    Patient Instructions  Medication Instructions:   Your physician has recommended you make the following change in your medication:     DECREASE your Atenolol to 25 MG once a day.  2.     START taking Ezetimibe (Zetia) 10 MG once a day.  *If you need a refill on your cardiac medications before your next appointment, please call your pharmacy*   Lab Work:  Your physician recommends that you return for a FASTING lipid profile:  IN 6 MONTHS (APRIL/MAY of 2023)  - You will need to be fasting. Please do not have anything to eat or drink after midnight the morning you have the lab work. You may only have water or black coffee with no cream or sugar.   - Please go to the [facility]  Medical Mall. You will check in at the front desk to the right as you walk into the atrium. Valet Parking is  offered if needed. - No appointment needed. You may go any day between 7 am and 6 pm.    Follow-Up: At [facility], you and your health needs are our priority.  As part of our continuing mission to provide you with exceptional heart care, we have created designated Provider Care Teams.  These Care Teams include your primary Cardiologist (physician) and Advanced Practice Providers (APPs -  Physician Assistants and Nurse Practitioners) who all work together to provide you with the care you need, when you need it.  We recommend signing up for the patient portal called "MyChart".  Sign up information is provided on this After Visit Summary.  MyChart is used to connect with patients for Virtual Visits (Telemedicine).  Patients are able to view lab/test results, encounter notes, upcoming appointments, etc.  Non-urgent messages can be sent to your provider as well.   To learn more about what you can do with MyChart, go to [email or web address].    Your next appointment:   1 year(s)  The format for your next appointment:   In Person  Provider:   Kate Sable, MD    Other Instructions   Important Information About Sugar         Signed, Sherry Sable, MD  03/24/2022 10:47 AM    [place]

## 2022-03-24 NOTE — Patient Instructions (Signed)
Medication Instructions:   Your physician has recommended you make the following change in your medication:     DECREASE your Atenolol to 25 MG once a day.  2.     START taking Ezetimibe (Zetia) 10 MG once a day.  *If you need a refill on your cardiac medications before your next appointment, please call your pharmacy*   Lab Work:  Your physician recommends that you return for a FASTING lipid profile:  IN 6 MONTHS (APRIL/MAY of 2023)  - You will need to be fasting. Please do not have anything to eat or drink after midnight the morning you have the lab work. You may only have water or black coffee with no cream or sugar.   - Please go to the [facility]. You will check in at the front desk to the right as you walk into the atrium. Valet Parking is offered if needed. - No appointment needed. You may go any day between 7 am and 6 pm.    Follow-Up: At [facility], you and your health needs are our priority.  As part of our continuing mission to provide you with exceptional heart care, we have created designated Provider Care Teams.  These Care Teams include your primary Cardiologist (physician) and Advanced Practice Providers (APPs -  Physician Assistants and Nurse Practitioners) who all work together to provide you with the care you need, when you need it.  We recommend signing up for the patient portal called "MyChart".  Sign up information is provided on this After Visit Summary.  MyChart is used to connect with patients for Virtual Visits (Telemedicine).  Patients are able to view lab/test results, encounter notes, upcoming appointments, etc.  Non-urgent messages can be sent to your provider as well.   To learn more about what you can do with MyChart, go to [email or web address].    Your next appointment:   1 year(s)  The format for your next appointment:   In Person  Provider:   Kate Sable, MD    Other Instructions   Important Information About  Sugar

## 2022-03-28 DIAGNOSIS — F509 Eating disorder, unspecified: Secondary | ICD-10-CM | POA: Diagnosis not present

## 2022-03-28 DIAGNOSIS — F341 Dysthymic disorder: Secondary | ICD-10-CM | POA: Diagnosis not present

## 2022-03-30 DIAGNOSIS — Z23 Encounter for immunization: Secondary | ICD-10-CM | POA: Diagnosis not present

## 2022-03-30 DIAGNOSIS — F341 Dysthymic disorder: Secondary | ICD-10-CM | POA: Diagnosis not present

## 2022-03-30 DIAGNOSIS — F509 Eating disorder, unspecified: Secondary | ICD-10-CM | POA: Diagnosis not present

## 2022-04-01 ENCOUNTER — Telehealth: Payer: Self-pay | Admitting: Cardiology

## 2022-04-01 NOTE — Telephone Encounter (Signed)
Clearance faxed to number requested with confirmation received.

## 2022-04-01 NOTE — Telephone Encounter (Signed)
Patient called stating clearance letter was never received.  It can be faxed at [phone]

## 2022-04-08 ENCOUNTER — Other Ambulatory Visit: Payer: Self-pay | Admitting: Internal Medicine

## 2022-04-08 DIAGNOSIS — F3342 Major depressive disorder, recurrent, in full remission: Secondary | ICD-10-CM

## 2022-04-08 DIAGNOSIS — I10 Essential (primary) hypertension: Secondary | ICD-10-CM

## 2022-04-22 DIAGNOSIS — M48062 Spinal stenosis, lumbar region with neurogenic claudication: Secondary | ICD-10-CM | POA: Diagnosis not present

## 2022-04-25 ENCOUNTER — Other Ambulatory Visit: Payer: Self-pay

## 2022-04-25 ENCOUNTER — Encounter: Payer: Self-pay | Admitting: Emergency Medicine

## 2022-04-25 ENCOUNTER — Ambulatory Visit: Payer: Medicare Other | Admitting: Family Medicine

## 2022-04-25 ENCOUNTER — Emergency Department
Admission: EM | Admit: 2022-04-25 | Discharge: 2022-04-25 | Disposition: A | Discharge status: Home / Self Care | Payer: Medicare Other | Attending: Student in an Organized Health Care Education/Training Program | Admitting: Student in an Organized Health Care Education/Training Program

## 2022-04-25 ENCOUNTER — Emergency Department: Payer: Medicare Other

## 2022-04-25 ENCOUNTER — Telehealth: Payer: Self-pay | Admitting: Internal Medicine

## 2022-04-25 DIAGNOSIS — S299XXA Unspecified injury of thorax, initial encounter: Secondary | ICD-10-CM | POA: Diagnosis present

## 2022-04-25 DIAGNOSIS — S20212A Contusion of left front wall of thorax, initial encounter: Secondary | ICD-10-CM | POA: Diagnosis not present

## 2022-04-25 DIAGNOSIS — W01198A Fall on same level from slipping, tripping and stumbling with subsequent striking against other object, initial encounter: Secondary | ICD-10-CM | POA: Insufficient documentation

## 2022-04-25 DIAGNOSIS — R0781 Pleurodynia: Secondary | ICD-10-CM | POA: Diagnosis not present

## 2022-04-25 DIAGNOSIS — S20219A Contusion of unspecified front wall of thorax, initial encounter: Secondary | ICD-10-CM | POA: Diagnosis not present

## 2022-04-25 DIAGNOSIS — W19XXXA Unspecified fall, initial encounter: Secondary | ICD-10-CM

## 2022-04-25 DIAGNOSIS — I10 Essential (primary) hypertension: Secondary | ICD-10-CM | POA: Insufficient documentation

## 2022-04-25 DIAGNOSIS — S20211A Contusion of right front wall of thorax, initial encounter: Secondary | ICD-10-CM | POA: Diagnosis not present

## 2022-04-25 MED ORDER — LIDOCAINE 5 % EX PTCH: 1.0000 | MEDICATED_PATCH | Freq: Two times a day (BID) | CUTANEOUS | 0 refills | Status: AC

## 2022-04-25 MED ORDER — LIDOCAINE 5 % EX PTCH
1.0000 | MEDICATED_PATCH | CUTANEOUS | Status: DC
  Administered 2022-04-25: 1 via TRANSDERMAL
  Filled 2022-04-25: qty 1

## 2022-04-25 MED ORDER — ACETAMINOPHEN 325 MG PO TABS
650.0000 mg | ORAL_TABLET | Freq: Once | ORAL | Status: AC
  Administered 2022-04-25: 650 mg via ORAL
  Filled 2022-04-25: qty 2

## 2022-04-25 MED ORDER — KETOROLAC TROMETHAMINE 15 MG/ML IJ SOLN
15.0000 mg | Freq: Once | INTRAMUSCULAR | Status: AC
  Administered 2022-04-25: 15 mg via INTRAMUSCULAR
  Filled 2022-04-25: qty 1

## 2022-04-25 NOTE — ED Notes (Signed)
See triage note  Presents with pain to left rib area  s/p fall couple of days ago   Increased pain with inspiration and movement

## 2022-04-25 NOTE — ED Provider Notes (Signed)
[facility] Provider Note    Event Date/Time   First MD Initiated Contact with Patient 04/25/22 1516     (approximate)   History   Fall   HPI  Sherry Davila is a 76 y.o. female with a past medical history of hypertension, depression, obesity who presents today for evaluation of anterior chest wall pain after a fall that occurred 2 days ago.  Patient reports that she slipped on her wet flip flops and fell forward, striking her chest on the tile.  There was no head strike or LOC.  She reports that she has continued to have pain to her anterior chest, right worse than left for the past 2 days.  She reports that she only has pain if she pushes on the area, rotates her trunk, or sneezes/coughs.  She does not feel short of breath.  She has no worsening pain with exertion.  No calf pain or leg swelling.  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   Low back pain 08/13/2021   Major depressive disorder, single episode, mild ([place]) 07/08/2021   Leg pain, bilateral 07/06/2021   Left hip pain 04/01/2021   Hyperglycemia 08/30/2020   Obesity (BMI 30-39.9) 04/20/2020   Acid reflux 10/13/2019   Chest pain 10/11/2019   Healthcare maintenance 10/01/2019   Constipation 10/28/2018   Personal history of other malignant neoplasm of skin 08/06/2018   Sleep apnea 09/28/2017   Atrophic vaginitis 01/19/2015   Hypertension    Hyperlipidemia    Anxiety    Depression, major, single episode, mild [facility])           Physical Exam   Triage Vital Signs: ED Triage Vitals  Enc Vitals Group     BP 04/25/22 1110 (!) 164/65     Pulse Rate 04/25/22 1110 60     Resp 04/25/22 1109 18     Temp 04/25/22 1109 98 F (36.7 C)     Temp src --      SpO2 04/25/22 1110 95 %      Weight 04/25/22 1055 231 lb 11.3 oz (105.1 kg)     Height 04/25/22 1055 '5\' 5"'$  (1.651 m)     Head Circumference --      Peak Flow --      Pain Score 04/25/22 1055 7     Pain Loc --      Pain Edu? --      Excl. in [place]? --     Most recent vital signs: Vitals:   04/25/22 1109 04/25/22 1110  BP:  (!) 164/65  Pulse:  60  Resp: 18   Temp: 98 F (36.7 C)   SpO2:  95%    Physical Exam Vitals and nursing note reviewed.  Constitutional:      General: Awake and alert. No acute distress.    Appearance: Normal appearance. The patient is overweight.  HENT:     Head: Normocephalic and atraumatic.     Mouth: Mucous membranes are moist.  Eyes:     General: PERRL. Normal EOMs        Right eye: No discharge.        Left eye: No discharge.     Conjunctiva/sclera: Conjunctivae normal.  Cardiovascular:     Rate and Rhythm: Normal  rate and regular rhythm.     Pulses: Normal pulses.     Heart sounds: Normal heart sounds Pulmonary:     Effort: Pulmonary effort is normal. No respiratory distress.     Breath sounds: Normal breath sounds.  Mild anterior chest wall pain without ecchymosis or erythema or evidence of injury Abdominal:     Abdomen is soft. There is no abdominal tenderness. No rebound or guarding. No distention.  No abdominal tenderness Musculoskeletal:        General: No swelling. Normal range of motion.     Cervical back: Normal range of motion and neck supple.  No midline cervical spine tenderness.  Full range of motion of neck.  Negative Spurling test.  Negative Lhermitte sign.  Normal strength and sensation in bilateral upper extremities. Normal grip strength bilaterally.  Normal intrinsic muscle function of the hand bilaterally.  Normal radial pulses bilaterally. Skin:    General: Skin is warm and dry.     Capillary Refill: Capillary refill takes less than 2 seconds.     Findings: No rash.  Neurological:     Mental Status: The patient is awake and alert.   Neurological: GCS 15 alert and oriented x3 Normal speech, no expressive or receptive aphasia or dysarthria Cranial nerves II through XII intact Normal visual fields 5 out of 5 strength in all 4 extremities with intact sensation throughout No extremity drift Normal finger-to-nose testing, no limb or truncal ataxia     ED Results / Procedures / Treatments   Labs (all labs ordered are listed, but only abnormal results are displayed) Labs Reviewed - No data to display   EKG     RADIOLOGY I independently reviewed and interpreted imaging and agree with radiologists findings.     PROCEDURES:  Critical Care performed:   Procedures   MEDICATIONS ORDERED IN ED: Medications  lidocaine (LIDODERM) 5 % 1 patch (1 patch Transdermal Patch Applied 04/25/22 1531)  ketorolac (TORADOL) 15 MG/ML injection 15 mg (15 mg Intramuscular Given 04/25/22 1531)  acetaminophen (TYLENOL) tablet 650 mg (650 mg Oral Given 04/25/22 1532)     IMPRESSION / MDM / ASSESSMENT AND PLAN / ED COURSE  I reviewed the triage vital signs and the nursing notes.   Differential diagnosis includes, but is not limited to, contusion, rib fracture, costochondritis, pneumothorax.  Patient is awake and alert, hemodynamically stable and afebrile.  She demonstrates no acute distress or increased work of breathing.  She is easily reproducible tenderness palpation to her anterior rib cage without ecchymosis or skin changes.  X-ray obtained in triage is normal.  I discussed further workup with advanced imaging for further evaluation and blood work, however patient declined as she feels it is "just a bruise."  Also recommend CT head and neck per [place] criteria, however patient denies head strike or LOC, no headache, this occurred 2 days ago.  She would like lidoderm patches.  These were given to her.  Her pain is reproducible with palpation, truncal rotation, or coughing/sneezing.  She has no dyspnea on exertion, no associated  shortness of breath, no nausea or vomiting, do not suspect acute coronary syndrome.  No calf pain or leg swelling or clinical signs or symptoms of DVT to suggest PE as a source of her symptoms today.  We discussed symptomatic management and return precautions.  Patient understands and agrees with plan.  Discharged in stable condition.   Patient's presentation is most consistent with acute complicated illness / injury requiring diagnostic workup.  FINAL CLINICAL IMPRESSION(S) / ED DIAGNOSES   Final diagnoses:  Fall, initial encounter  Contusion of chest wall, unspecified laterality, initial encounter     Rx / DC Orders   ED Discharge Orders          Ordered    lidocaine (LIDODERM) 5 %  Every 12 hours        04/25/22 1553             Note:  This document was prepared using Dragon voice recognition software and may include unintentional dictation errors.   Emeline Gins 04/25/22 1836    Merlyn Lot, MD 04/25/22 [phone]

## 2022-04-25 NOTE — Telephone Encounter (Signed)
Patient fell over the weekend. She is having chest pain and shallow breathing. Patient advised to go to the ED.

## 2022-04-25 NOTE — Discharge Instructions (Addendum)
Your xray was normal. You may continue to take Tylenol/ibuprofen per package instructions to help with your symptoms.  You may also use the patches as prescribed.  Please return for any new, worsening, or change in symptoms or other concerns.  It was a pleasure caring for you today.

## 2022-04-25 NOTE — ED Triage Notes (Signed)
C/o mechanical fall Saturday night.  C/O bilateral rib pain left worse than right.  States slipped and fell onto tile floor.

## 2022-04-26 ENCOUNTER — Encounter (INDEPENDENT_AMBULATORY_CARE_PROVIDER_SITE_OTHER): Payer: Self-pay | Admitting: Family Medicine

## 2022-04-26 ENCOUNTER — Ambulatory Visit (INDEPENDENT_AMBULATORY_CARE_PROVIDER_SITE_OTHER): Payer: Medicare Other | Admitting: Family Medicine

## 2022-04-26 VITALS — BP 169/80 | HR 64 | Temp 98.5°F | Ht 65.0 in | Wt 225.0 lb

## 2022-04-26 DIAGNOSIS — E559 Vitamin D deficiency, unspecified: Secondary | ICD-10-CM | POA: Diagnosis not present

## 2022-04-26 DIAGNOSIS — M549 Dorsalgia, unspecified: Secondary | ICD-10-CM | POA: Diagnosis not present

## 2022-04-26 DIAGNOSIS — E669 Obesity, unspecified: Secondary | ICD-10-CM

## 2022-04-26 DIAGNOSIS — Z6837 Body mass index (BMI) 37.0-37.9, adult: Secondary | ICD-10-CM | POA: Diagnosis not present

## 2022-04-26 MED ORDER — VITAMIN D (ERGOCALCIFEROL) 1.25 MG (50000 UNIT) PO CAPS: 50000.0000 [IU] | ORAL_CAPSULE | ORAL | 0 refills | Status: DC

## 2022-05-04 DIAGNOSIS — Z01818 Encounter for other preprocedural examination: Secondary | ICD-10-CM | POA: Diagnosis not present

## 2022-05-04 DIAGNOSIS — B9681 Helicobacter pylori [H. pylori] as the cause of diseases classified elsewhere: Secondary | ICD-10-CM | POA: Diagnosis not present

## 2022-05-04 DIAGNOSIS — Z6837 Body mass index (BMI) 37.0-37.9, adult: Secondary | ICD-10-CM | POA: Diagnosis not present

## 2022-05-04 DIAGNOSIS — K2950 Unspecified chronic gastritis without bleeding: Secondary | ICD-10-CM | POA: Diagnosis not present

## 2022-05-04 DIAGNOSIS — K2970 Gastritis, unspecified, without bleeding: Secondary | ICD-10-CM | POA: Diagnosis not present

## 2022-05-04 NOTE — Progress Notes (Signed)
Chief Complaint:   OBESITY Sherry Davila is here to discuss her progress with her obesity treatment plan along with follow-up of her obesity related diagnoses. [name] is on weight watchers 1500-calorie plan and states she is following her eating plan approximately 80% of the time. [name] states she is not exercising.  Today's visit was #: 5 Starting weight: 27 LBS Starting date: 12/23/2021 Today's weight: 225 LBS Today's date: 04/26/2022 Total lbs lost to date: 0 Total lbs lost since last in-office visit: +1 LB  Interim History: Patient switched from weight watchers to 1500-calorie diet plan per [facility] in preparation for bariatric surgery.  She has met with the registered dietitian, behavioral therapist, and Psychologist, sport and exercise.  She has good support system at home.  She plans to walk the dog, and go back to the gym.  She is scheduled for preop, EGD next week.  Subjective:   1. Back pain, unspecified back location, unspecified back pain laterality, unspecified chronicity She has had a flare of back pain due to a fall in late November.  She received a steroid injection which helped with pain.  Limited physical activity for the following.  2. Vitamin D deficiency Her last vitamin D level rechecked by [facility] on 03/22/2022, was 31.  Assessment/Plan:   1. Back pain, unspecified back location, unspecified back pain laterality, unspecified chronicity Encouraged patient to notify bariatric surgeon about her recent corticosteroid injection.  2. Vitamin D deficiency Refill- Vitamin D, Ergocalciferol, (DRISDOL) 1.25 MG (50000 UNIT) CAPS capsule; Take 1 capsule (50,000 Units total) by mouth every 7 (seven) days.  Dispense: 12 capsule; Refill: 0  3. Obesity,current BMI 37.4 Proceed with [facility] bariatric surgery program.  [name] is currently in the action stage of change. As such, her goal is to continue with weight loss efforts. She has agreed to keeping a food journal and adhering to recommended goals of 1500  calories and 85 protein daily.  Exercise goals:  Walking, gym 3-4 times a week.  Behavioral modification strategies: increasing lean protein intake, increasing vegetables, increasing water intake, decreasing eating out, no skipping meals, meal planning and cooking strategies, keeping healthy foods in the home, and planning for success.  [name] has agreed to follow-up with our clinic in 7-8 weeks. She was informed of the importance of frequent follow-up visits to maximize her success with intensive lifestyle modifications for her multiple health conditions.   Objective:   Blood pressure (!) 169/80, pulse 64, temperature 98.5 F (36.9 C), height 5' 5" (1.651 m), weight 225 lb (102.1 kg), SpO2 100 %. Body mass index is 37.44 kg/m.  General: Cooperative, alert, well developed, in no acute distress. HEENT: Conjunctivae and lids unremarkable. Cardiovascular: Regular rhythm.  Lungs: Normal work of breathing. Neurologic: No focal deficits.   Lab Results  Component Value Date   CREATININE 0.62 11/29/2021   BUN 20 11/29/2021   NA 138 11/29/2021   K 4.2 11/29/2021   CL 105 11/29/2021   CO2 28 11/29/2021   Lab Results  Component Value Date   ALT 12 11/29/2021   AST 13 11/29/2021   ALKPHOS 88 11/29/2021   BILITOT 0.5 11/29/2021   Lab Results  Component Value Date   HGBA1C 5.5 11/29/2021   HGBA1C 5.5 06/23/2021   HGBA1C 5.6 10/07/2020   No results found for: "INSULIN" Lab Results  Component Value Date   TSH 1.990 12/23/2021   Lab Results  Component Value Date   CHOL 245 (H) 11/29/2021   HDL 81.90 11/29/2021   [place]  131 (H) 11/29/2021   TRIG 162.0 (H) 11/29/2021   CHOLHDL 3 11/29/2021   Lab Results  Component Value Date   VD25OH 24.2 (L) 12/23/2021   Lab Results  Component Value Date   WBC 5.9 08/16/2021   HGB 13.9 08/16/2021   HCT 41.6 08/16/2021   MCV 92.4 08/16/2021   PLT 258.0 08/16/2021   No results found for: "IRON", "TIBC", "FERRITIN"  Attestation  Statements:   Reviewed by clinician on day of visit: allergies, medications, problem list, medical history, surgical history, family history, social history, and previous encounter notes.  I, Davy Pique, am acting as Location manager for Loyal Gambler, DO.  I have reviewed the above documentation for accuracy and completeness, and I agree with the above. Dell Ponto, DO

## 2022-05-05 ENCOUNTER — Other Ambulatory Visit (INDEPENDENT_AMBULATORY_CARE_PROVIDER_SITE_OTHER): Payer: Medicare Other

## 2022-05-05 ENCOUNTER — Ambulatory Visit (INDEPENDENT_AMBULATORY_CARE_PROVIDER_SITE_OTHER): Payer: Medicare Other | Admitting: Internal Medicine

## 2022-05-05 ENCOUNTER — Encounter: Payer: Self-pay | Admitting: Internal Medicine

## 2022-05-05 VITALS — BP 144/72 | HR 63 | Temp 99.1°F | Ht 65.0 in | Wt 231.0 lb

## 2022-05-05 DIAGNOSIS — F320 Major depressive disorder, single episode, mild: Secondary | ICD-10-CM | POA: Diagnosis not present

## 2022-05-05 DIAGNOSIS — G720 Drug-induced myopathy: Secondary | ICD-10-CM

## 2022-05-05 DIAGNOSIS — F3342 Major depressive disorder, recurrent, in full remission: Secondary | ICD-10-CM

## 2022-05-05 DIAGNOSIS — R079 Chest pain, unspecified: Secondary | ICD-10-CM | POA: Diagnosis not present

## 2022-05-05 DIAGNOSIS — T466X5A Adverse effect of antihyperlipidemic and antiarteriosclerotic drugs, initial encounter: Secondary | ICD-10-CM

## 2022-05-05 DIAGNOSIS — K219 Gastro-esophageal reflux disease without esophagitis: Secondary | ICD-10-CM | POA: Diagnosis not present

## 2022-05-05 DIAGNOSIS — M5442 Lumbago with sciatica, left side: Secondary | ICD-10-CM

## 2022-05-05 DIAGNOSIS — I10 Essential (primary) hypertension: Secondary | ICD-10-CM

## 2022-05-05 DIAGNOSIS — G4730 Sleep apnea, unspecified: Secondary | ICD-10-CM

## 2022-05-05 DIAGNOSIS — E785 Hyperlipidemia, unspecified: Secondary | ICD-10-CM

## 2022-05-05 DIAGNOSIS — R739 Hyperglycemia, unspecified: Secondary | ICD-10-CM

## 2022-05-05 LAB — HEPATIC FUNCTION PANEL
ALT: 15 U/L (ref 0–35)
AST: 15 U/L (ref 0–37)
Albumin: 4.1 g/dL (ref 3.5–5.2)
Alkaline Phosphatase: 116 U/L (ref 39–117)
Bilirubin, Direct: 0.1 mg/dL (ref 0.0–0.3)
Total Bilirubin: 0.5 mg/dL (ref 0.2–1.2)
Total Protein: 6.4 g/dL (ref 6.0–8.3)

## 2022-05-05 LAB — LIPID PANEL
Cholesterol: 227 mg/dL — ABNORMAL HIGH (ref 0–200)
HDL: 98.2 mg/dL (ref >39.00)
LDL Cholesterol: 105 mg/dL — ABNORMAL HIGH (ref 0–99)
NonHDL: 129.28
Total CHOL/HDL Ratio: 2
Triglycerides: 123 mg/dL (ref 0.0–149.0)
VLDL: 24.6 mg/dL (ref 0.0–40.0)

## 2022-05-05 LAB — BASIC METABOLIC PANEL
BUN: 15 mg/dL (ref 6–23)
CO2: 29 mEq/L (ref 19–32)
Calcium: 9.4 mg/dL (ref 8.4–10.5)
Chloride: 105 mEq/L (ref 96–112)
Creatinine, Ser: 0.58 mg/dL (ref 0.40–1.20)
GFR: 88.77 mL/min (ref >60.00)
Glucose, Bld: 98 mg/dL (ref 70–99)
Potassium: 4.1 mEq/L (ref 3.5–5.1)
Sodium: 141 mEq/L (ref 135–145)

## 2022-05-05 LAB — HEMOGLOBIN A1C: Hgb A1c MFr Bld: 5.6 % (ref 4.6–6.5)

## 2022-05-05 MED ORDER — FLUOXETINE HCL 40 MG PO CAPS: 40.0000 mg | ORAL_CAPSULE | Freq: Every day | ORAL | 1 refills | Status: DC

## 2022-05-05 MED ORDER — AMLODIPINE BESYLATE 10 MG PO TABS: 10.0000 mg | ORAL_TABLET | Freq: Every day | ORAL | 1 refills | Status: DC

## 2022-05-05 MED ORDER — LOSARTAN POTASSIUM-HCTZ 100-12.5 MG PO TABS: 1.0000 | ORAL_TABLET | Freq: Every day | ORAL | 1 refills | Status: DC

## 2022-05-05 NOTE — Progress Notes (Signed)
Patient ID: Sherry Davila, female   DOB: 06/06/1945, 76 y.o.   MRN: [id number]   Subjective:    Patient ID: Sherry Davila, female    DOB: 07/04/1945, 76 y.o.   MRN: [id number]   Patient here for  Chief Complaint  Patient presents with   Medical Management of Chronic Issues   .   HPI Here to follow up regarding increased stress and blood pressure.  She reports she is doing relatively well.  No chest pain or sob reported.  No abdominal pain reported.  Miralax - q day.  Was seeing ortho - low back pain.  Was attending PT for low back pain.  Last visit was having persistent pain  - low back/buttock and left leg.  Had f/u ortho 02/24/22 - recommended MRI.  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  Recommended referral to physiatry for evaluation - ESI. Has continued PT.  Has established with bariatric clinic - discussions of bariatric surgery.  Had pre op evaluation 03/24/22 - cardiology - per review - ok to proceed with surgery from a cardiac standpoint.  Did recommend reducing atenolol to 25mg q day - due to bradycardia.  Was evaluated 04/25/22 - s/p fall.  Slipped - wet - flip flops and fell forward - hit her chest.  Evaluated for persistent anterior chest wall pain. Reproducible tenderness to palpation.  Xray - unrevealing.  Denies head injury or LOC.  Lidocaine patches.  S/p EGD 05/04/22.  Reports feels - stable.  No increased chest pain reported.  Breathing stable.  No increased cough or congestion.  No abdominal pain or bowel change reported.    Past Medical History:  Diagnosis Date   Actinic keratosis 04/04/2018   L nose lat to supra tip   Anxiety    Basal cell carcinoma 04/04/2018   R prox nasal ala   Depression    Dysplastic nevus 05/07/2013   R abdomen - mod to severe, excision 05/29/2013   Hyperlipidemia    Hypertension    Obesity    Sleep apnea    has a cpap, doesn't wear    Varicose vein    Past Surgical History:  Procedure Laterality  Date   COLONOSCOPY WITH PROPOFOL N/A 09/08/2016   Procedure: COLONOSCOPY WITH PROPOFOL;  Surgeon: Kiran Anna, MD;  Location: [facility];  Service: Endoscopy;  Laterality: N/A;   COLONOSCOPY WITH PROPOFOL N/A 01/07/2020   Procedure: COLONOSCOPY WITH PROPOFOL;  Surgeon: Anna, Kiran, MD;  Location: [facility] ENDOSCOPY;  Service: Gastroenterology;  Laterality: N/A;   FRACTURE SURGERY     foot, screws placed   JOINT REPLACEMENT Right 2009   knee   Family History  Problem Relation Age of Onset   Cancer Mother    Heart failure Mother    Heart disease Mother    Breast cancer Mother 78   Heart attack Mother    Obesity Father    Depression Father    Early death Father    Hyperlipidemia Father    AAA (abdominal aortic aneurysm) Father    Hypertension Father    Heart disease Father    Hypertension Sister    Social History   Socioeconomic History   Marital status: Married    Spouse name: Not on file   Number of children: Not on file   Years of education: Not on file   Highest education level: Not on file  Occupational History   Not on file  Tobacco Use   Smoking   status: Former    Types: Cigarettes    Quit date: 01/19/1988    Years since quitting: 34.3   Smokeless tobacco: Never  Vaping Use   Vaping Use: Never used  Substance and Sexual Activity   Alcohol use: Yes    Alcohol/week: 3.0 standard drinks of alcohol    Types: 3 Shots of liquor per week    Comment: 3 drinks a week socially    Drug use: No   Sexual activity: Not on file  Other Topics Concern   Not on file  Social History Narrative   Not on file   Social Determinants of Health   Financial Resource Strain: Low Risk  (10/11/2021)   Overall Financial Resource Strain (CARDIA)    Difficulty of Paying Living Expenses: Not hard at all  Food Insecurity: No Food Insecurity (10/11/2021)   Hunger Vital Sign    Worried About Running Out of Food in the Last Year: Never true    Ran Out of Food in the Last Year: Never true   Transportation Needs: No Transportation Needs (10/11/2021)   PRAPARE - Transportation    Lack of Transportation (Medical): No    Lack of Transportation (Non-Medical): No  Physical Activity: Insufficiently Active (10/11/2021)   Exercise Vital Sign    Days of Exercise per Week: 3 days    Minutes of Exercise per Session: 30 min  Stress: No Stress Concern Present (10/11/2021)   [employer] - Occupational Stress Questionnaire    Feeling of Stress : Not at all  Social Connections: Moderately Integrated (10/11/2021)   Social Connection and Isolation Panel [NHANES]    Frequency of Communication with Friends and Family: Never    Frequency of Social Gatherings with Friends and Family: More than three times a week    Attends Religious Services: More than 4 times per year    Active Member of Clubs or Organizations: No    Attends Club or Organization Meetings: Never    Marital Status: Married     Review of Systems  Constitutional:  Negative for appetite change and unexpected weight change.  HENT:  Negative for congestion and sinus pressure.   Respiratory:  Negative for cough and chest tightness.        Breathing stable.   Cardiovascular:  Negative for palpitations and leg swelling.       No increased chest pain.   Gastrointestinal:  Negative for abdominal pain, diarrhea, nausea and vomiting.  Genitourinary:  Negative for difficulty urinating and dysuria.  Musculoskeletal:  Negative for myalgias.       Low back pain and left leg pain - followed by ortho and PT  Skin:  Negative for color change and rash.  Neurological:  Negative for dizziness, light-headedness and headaches.  Psychiatric/Behavioral:  Negative for agitation and dysphoric mood.        Increased stress.         Objective:     BP (!) 144/72   Pulse 63   Temp 99.1 F (37.3 C)   Ht 5' 5" (1.651 m)   Wt 231 lb (104.8 kg)   SpO2 94%   BMI 38.44 kg/m  Wt Readings from Last 3 Encounters:   05/05/22 231 lb (104.8 kg)  04/26/22 225 lb (102.1 kg)  04/25/22 231 lb 11.3 oz (105.1 kg)    Physical Exam Vitals reviewed.  Constitutional:      General: She is not in acute distress.    Appearance: Normal appearance.  HENT:       Head: Normocephalic and atraumatic.     Right Ear: External ear normal.     Left Ear: External ear normal.  Eyes:     General: No scleral icterus.       Right eye: No discharge.        Left eye: No discharge.     Conjunctiva/sclera: Conjunctivae normal.  Neck:     Thyroid: No thyromegaly.  Cardiovascular:     Rate and Rhythm: Normal rate and regular rhythm.  Pulmonary:     Effort: No respiratory distress.     Breath sounds: Normal breath sounds. No wheezing.  Abdominal:     General: Bowel sounds are normal.     Palpations: Abdomen is soft.     Tenderness: There is no abdominal tenderness.  Musculoskeletal:        General: No swelling or tenderness.     Cervical back: Neck supple. No tenderness.  Lymphadenopathy:     Cervical: No cervical adenopathy.  Skin:    Findings: No erythema or rash.  Neurological:     Mental Status: She is alert.  Psychiatric:        Mood and Affect: Mood normal.        Behavior: Behavior normal.      Outpatient Encounter Medications as of 05/05/2022  Medication Sig   amLODipine (NORVASC) 10 MG tablet Take 1 tablet (10 mg total) by mouth daily.   atenolol (TENORMIN) 25 MG tablet Take 1 tablet (25 mg total) by mouth daily.   ezetimibe (ZETIA) 10 MG tablet Take 1 tablet (10 mg total) by mouth daily.   Vitamin D, Ergocalciferol, (DRISDOL) 1.25 MG (50000 UNIT) CAPS capsule Take 1 capsule (50,000 Units total) by mouth every 7 (seven) days.   [DISCONTINUED] amLODipine (NORVASC) 5 MG tablet Take 1 tablet (5 mg total) by mouth daily.   FLUoxetine (PROZAC) 40 MG capsule Take 1 capsule (40 mg total) by mouth daily.   losartan-hydrochlorothiazide (HYZAAR) 100-12.5 MG tablet Take 1 tablet by mouth daily.   [DISCONTINUED]  FLUoxetine (PROZAC) 40 MG capsule TAKE 1 CAPSULE BY MOUTH DAILY   [DISCONTINUED] losartan-hydrochlorothiazide (HYZAAR) 100-12.5 MG tablet TAKE 1 TABLET BY MOUTH DAILY   No facility-administered encounter medications on file as of 05/05/2022.     Lab Results  Component Value Date   WBC 5.9 08/16/2021   HGB 13.9 08/16/2021   HCT 41.6 08/16/2021   PLT 258.0 08/16/2021   GLUCOSE 98 05/05/2022   CHOL 227 (H) 05/05/2022   TRIG 123.0 05/05/2022   HDL 98.20 05/05/2022   LDLCALC 105 (H) 05/05/2022   ALT 15 05/05/2022   AST 15 05/05/2022   NA 141 05/05/2022   K 4.1 05/05/2022   CL 105 05/05/2022   CREATININE 0.58 05/05/2022   BUN 15 05/05/2022   CO2 29 05/05/2022   TSH 1.990 12/23/2021   HGBA1C 5.6 05/05/2022    MM 3D SCREEN BREAST BILATERAL  Result Date: 10/15/2021 CLINICAL DATA:  Screening. EXAM: DIGITAL SCREENING BILATERAL MAMMOGRAM WITH TOMOSYNTHESIS AND CAD TECHNIQUE: Bilateral screening digital craniocaudal and mediolateral oblique mammograms were obtained. Bilateral screening digital breast tomosynthesis was performed. The images were evaluated with computer-aided detection. COMPARISON:  Previous exam(s). ACR Breast Density Category b: There are scattered areas of fibroglandular density. FINDINGS: There are no findings suspicious for malignancy. IMPRESSION: No mammographic evidence of malignancy. A result letter of this screening mammogram will be mailed directly to the patient. RECOMMENDATION: Screening mammogram in one year. (Code:SM-B-01Y) BI-RADS CATEGORY  1: Negative. Electronically Signed     By: Dorise Bullion III M.D.   On: 10/15/2021 14:37       Assessment & Plan:   Problem List Items Addressed This Visit     Acid reflux - Primary    Just had EGD.  Obtain results.        Chest pain    Recent chest pain s/p fall.  Better.  Had pre op evaluation - cardiology.  Reviewed.  Previous testing as outlined.  No new symptoms.  Follow. Continue risk factor modification.        Depression    Discussed increased stress.  Previously discussion regarding counselors - information given.       Relevant Medications   FLUoxetine (PROZAC) 40 MG capsule   Depression, major, single episode, mild (HCC)    Continues on prozac.  Counseling as outlined. Follow.       Relevant Medications   FLUoxetine (PROZAC) 40 MG capsule   Essential hypertension   Relevant Medications   losartan-hydrochlorothiazide (HYZAAR) 100-12.5 MG tablet   amLODipine (NORVASC) 10 MG tablet   Hyperglycemia    Low carb diet and exercise. Follow met b and a1c.  Lab Results  Component Value Date   HGBA1C 5.6 05/05/2022       Hyperlipidemia    The 10-year ASCVD risk score (Arnett DK, et al., 2019) is: 24.8%   Values used to calculate the score:     Age: 13 years     Sex: Female     Is Non-Hispanic African American: No     Diabetic: No     Tobacco smoker: No     Systolic Blood Pressure: 767 mmHg     Is BP treated: Yes     HDL Cholesterol: 98.2 mg/dL     Total Cholesterol: 227 mg/dL  Have discussed calculated cholesterol risk. Had muscle aches with statin medication.  Zetia. work on diet and exercise.  Follow lipid panel.       Relevant Medications   losartan-hydrochlorothiazide (HYZAAR) 100-12.5 MG tablet   amLODipine (NORVASC) 10 MG tablet   Hypertension    Continue losartan/hctz, amlodipine and atenolol.  Blood pressure elevated.  Increase amlodipine to 60m q day.  Follow pressures.  Follow metabolic panel.        Relevant Medications   losartan-hydrochlorothiazide (HYZAAR) 100-12.5 MG tablet   amLODipine (NORVASC) 10 MG tablet   Low back pain    Has seen ortho.  PT.  Follow.       Sleep apnea    CPAP.       Statin myopathy    Muscle aches with statin.  Zetia.         CEinar Pheasant MD

## 2022-05-09 ENCOUNTER — Encounter: Payer: Self-pay | Admitting: Internal Medicine

## 2022-05-10 DIAGNOSIS — G8929 Other chronic pain: Secondary | ICD-10-CM | POA: Diagnosis not present

## 2022-05-10 DIAGNOSIS — M48062 Spinal stenosis, lumbar region with neurogenic claudication: Secondary | ICD-10-CM | POA: Diagnosis not present

## 2022-05-10 DIAGNOSIS — M5442 Lumbago with sciatica, left side: Secondary | ICD-10-CM | POA: Diagnosis not present

## 2022-05-10 NOTE — Telephone Encounter (Signed)
Who is she wanting to see - to be able to place the order for the referral - need name of physician or practice.

## 2022-05-11 ENCOUNTER — Other Ambulatory Visit: Payer: Self-pay

## 2022-05-11 NOTE — Telephone Encounter (Signed)
Ok

## 2022-05-19 ENCOUNTER — Encounter: Payer: Self-pay | Admitting: Internal Medicine

## 2022-05-19 DIAGNOSIS — T466X5A Adverse effect of antihyperlipidemic and antiarteriosclerotic drugs, initial encounter: Secondary | ICD-10-CM | POA: Insufficient documentation

## 2022-05-19 DIAGNOSIS — G720 Drug-induced myopathy: Secondary | ICD-10-CM | POA: Insufficient documentation

## 2022-05-19 NOTE — Assessment & Plan Note (Signed)
Low carb diet and exercise. Follow met b and a1c.  Lab Results  Component Value Date   HGBA1C 5.6 05/05/2022

## 2022-05-19 NOTE — Assessment & Plan Note (Signed)
The 10-year ASCVD risk score (Arnett DK, et al., 2019) is: 24.8%   Values used to calculate the score:     Age: 76 years     Sex: Female     Is Non-Hispanic African American: No     Diabetic: No     Tobacco smoker: No     Systolic Blood Pressure: 338 mmHg     Is BP treated: Yes     HDL Cholesterol: 98.2 mg/dL     Total Cholesterol: 227 mg/dL  Have discussed calculated cholesterol risk. Had muscle aches with statin medication.  Zetia. work on diet and exercise.  Follow lipid panel.

## 2022-05-19 NOTE — Assessment & Plan Note (Signed)
Has seen ortho.  PT.  Follow.

## 2022-05-19 NOTE — Assessment & Plan Note (Signed)
Discussed increased stress.  Previously discussion regarding counselors - information given.

## 2022-05-19 NOTE — Assessment & Plan Note (Signed)
Muscle aches with statin.  Zetia.  

## 2022-05-19 NOTE — Assessment & Plan Note (Signed)
Continue losartan/hctz, amlodipine and atenolol.  Blood pressure elevated.  Increase amlodipine to '10mg'$  q day.  Follow pressures.  Follow metabolic panel.

## 2022-05-19 NOTE — Assessment & Plan Note (Signed)
CPAP.  

## 2022-05-19 NOTE — Assessment & Plan Note (Signed)
Just had EGD.  Obtain results.

## 2022-05-19 NOTE — Assessment & Plan Note (Signed)
Recent chest pain s/p fall.  Better.  Had pre op evaluation - cardiology.  Reviewed.  Previous testing as outlined.  No new symptoms.  Follow. Continue risk factor modification.

## 2022-05-19 NOTE — Assessment & Plan Note (Signed)
Continues on prozac.  Counseling as outlined. Follow.

## 2022-05-29 ENCOUNTER — Other Ambulatory Visit (INDEPENDENT_AMBULATORY_CARE_PROVIDER_SITE_OTHER): Payer: Self-pay | Admitting: Family Medicine

## 2022-05-29 DIAGNOSIS — E559 Vitamin D deficiency, unspecified: Secondary | ICD-10-CM

## 2022-06-15 ENCOUNTER — Encounter (INDEPENDENT_AMBULATORY_CARE_PROVIDER_SITE_OTHER): Payer: Self-pay | Admitting: Family Medicine

## 2022-06-15 ENCOUNTER — Ambulatory Visit (INDEPENDENT_AMBULATORY_CARE_PROVIDER_SITE_OTHER): Payer: Medicare Other | Admitting: Family Medicine

## 2022-06-15 VITALS — BP 146/78 | HR 69 | Temp 99.0°F | Ht 65.0 in | Wt 222.0 lb

## 2022-06-15 DIAGNOSIS — G8929 Other chronic pain: Secondary | ICD-10-CM

## 2022-06-15 DIAGNOSIS — E66812 Obesity, class 2: Secondary | ICD-10-CM

## 2022-06-15 DIAGNOSIS — I10 Essential (primary) hypertension: Secondary | ICD-10-CM

## 2022-06-15 DIAGNOSIS — M549 Dorsalgia, unspecified: Secondary | ICD-10-CM

## 2022-06-15 DIAGNOSIS — E559 Vitamin D deficiency, unspecified: Secondary | ICD-10-CM | POA: Diagnosis not present

## 2022-06-15 DIAGNOSIS — Z6836 Body mass index (BMI) 36.0-36.9, adult: Secondary | ICD-10-CM

## 2022-06-15 DIAGNOSIS — E669 Obesity, unspecified: Secondary | ICD-10-CM | POA: Diagnosis not present

## 2022-06-15 MED ORDER — VITAMIN D (ERGOCALCIFEROL) 1.25 MG (50000 UNIT) PO CAPS: 50000.0000 [IU] | ORAL_CAPSULE | ORAL | 0 refills | Status: DC

## 2022-06-28 DIAGNOSIS — F341 Dysthymic disorder: Secondary | ICD-10-CM | POA: Diagnosis not present

## 2022-06-30 ENCOUNTER — Ambulatory Visit (INDEPENDENT_AMBULATORY_CARE_PROVIDER_SITE_OTHER): Payer: Medicare Other | Admitting: Internal Medicine

## 2022-06-30 ENCOUNTER — Encounter: Payer: Self-pay | Admitting: Internal Medicine

## 2022-06-30 VITALS — BP 128/72 | HR 71 | Temp 97.9°F | Resp 16 | Ht 66.0 in | Wt 226.0 lb

## 2022-06-30 DIAGNOSIS — T466X5A Adverse effect of antihyperlipidemic and antiarteriosclerotic drugs, initial encounter: Secondary | ICD-10-CM

## 2022-06-30 DIAGNOSIS — Z1231 Encounter for screening mammogram for malignant neoplasm of breast: Secondary | ICD-10-CM

## 2022-06-30 DIAGNOSIS — G4730 Sleep apnea, unspecified: Secondary | ICD-10-CM

## 2022-06-30 DIAGNOSIS — R739 Hyperglycemia, unspecified: Secondary | ICD-10-CM

## 2022-06-30 DIAGNOSIS — K219 Gastro-esophageal reflux disease without esophagitis: Secondary | ICD-10-CM | POA: Diagnosis not present

## 2022-06-30 DIAGNOSIS — E669 Obesity, unspecified: Secondary | ICD-10-CM | POA: Diagnosis not present

## 2022-06-30 DIAGNOSIS — G720 Drug-induced myopathy: Secondary | ICD-10-CM | POA: Diagnosis not present

## 2022-06-30 DIAGNOSIS — F320 Major depressive disorder, single episode, mild: Secondary | ICD-10-CM

## 2022-06-30 DIAGNOSIS — D692 Other nonthrombocytopenic purpura: Secondary | ICD-10-CM

## 2022-06-30 DIAGNOSIS — I10 Essential (primary) hypertension: Secondary | ICD-10-CM | POA: Diagnosis not present

## 2022-06-30 DIAGNOSIS — E785 Hyperlipidemia, unspecified: Secondary | ICD-10-CM | POA: Diagnosis not present

## 2022-06-30 MED ORDER — ZEPBOUND 2.5 MG/0.5ML ~~LOC~~ SOAJ: 2.5000 mg | SUBCUTANEOUS | 1 refills | Status: DC

## 2022-06-30 NOTE — Progress Notes (Signed)
Subjective:    Patient ID: Sherry Davila, female    DOB: [birth year], 77 y.o.   MRN: [id number]  Patient here for  Chief Complaint  Patient presents with   Medical Management of Chronic Issues    HPI Here to follow up regarding increased stress and blood pressure. Has seen ortho and physiatry - for low back pain.  Also has established with bariatric clinic.  Discussed trial of GLP-1 agonist.  Wants try zepbound.  Discussed possible side effects of medication.  No family history or personal history of neuroendocrine tumors/cancer.  No swallowing issues.  No gallbladder or pancreas issues.  No chest pain or sob reported.  No cough or congestion.  No abdominal pain. Blood pressure elevated last visit.  Amlodipine increased to 72m.  Remains on losartan/hctz and atenolol.  Blood pressure doing better.  Regarding stress - overall doing better.    Past Medical History:  Diagnosis Date   Actinic keratosis 04/04/2018   L nose lat to supra tip   Anxiety    Basal cell carcinoma 04/04/2018   R prox nasal ala   Depression    Dysplastic nevus 05/07/2013   R abdomen - mod to severe, excision 05/29/2013   Hyperlipidemia    Hypertension    Obesity    Sleep apnea    has a cpap, doesn't wear    Varicose vein    Past Surgical History:  Procedure Laterality Date   COLONOSCOPY WITH PROPOFOL N/A 09/08/2016   Procedure: COLONOSCOPY WITH PROPOFOL;  Surgeon: KJonathon Bellows MD;  Location: [facility] ENDOSCOPY;  Service: Endoscopy;  Laterality: N/A;   COLONOSCOPY WITH PROPOFOL N/A 01/07/2020   Procedure: COLONOSCOPY WITH PROPOFOL;  Surgeon: AJonathon Bellows MD;  [facility];  Service: Gastroenterology;  Laterality: N/A;   FRACTURE SURGERY     foot, screws placed   JOINT REPLACEMENT Right 2009   knee   Family History  Problem Relation Age of Onset   Cancer Mother    Heart failure Mother    Heart disease Mother    Breast cancer Mother 770  Heart attack Mother    Obesity Father    Depression  Father    Early death Father    Hyperlipidemia Father    AAA (abdominal aortic aneurysm) Father    Hypertension Father    Heart disease Father    Hypertension Sister    Social History   Socioeconomic History   Marital status: Married    Spouse name: Not on file   Number of children: Not on file   Years of education: Not on file   Highest education level: Not on file  Occupational History   Not on file  Tobacco Use   Smoking status: Former    Types: Cigarettes    Quit date: 01/19/1988    Years since quitting: 34.4   Smokeless tobacco: Never  Vaping Use   Vaping Use: Never used  Substance and Sexual Activity   Alcohol use: Yes    Alcohol/week: 3.0 standard drinks of alcohol    Types: 3 Shots of liquor per week    Comment: 3 drinks a week socially    Drug use: No   Sexual activity: Not on file  Other Topics Concern   Not on file  Social History Narrative   Not on file   Social Determinants of Health   Financial Resource Strain: Low Risk  (10/11/2021)   Overall Financial Resource Strain (CARDIA)    Difficulty of Paying Living  Expenses: Not hard at all  Food Insecurity: No Food Insecurity (10/11/2021)   Hunger Vital Sign    Worried About Running Out of Food in the Last Year: Never true    Ran Out of Food in the Last Year: Never true  Transportation Needs: No Transportation Needs (10/11/2021)   PRAPARE - Hydrologist (Medical): No    Lack of Transportation (Non-Medical): No  Physical Activity: Insufficiently Active (10/11/2021)   Exercise Vital Sign    Days of Exercise per Week: 3 days    Minutes of Exercise per Session: 30 min  Stress: No Stress Concern Present (10/11/2021)   [place]    Feeling of Stress : Not at all  Social Connections: Moderately Integrated (10/11/2021)   Social Connection and Isolation Panel [NHANES]    Frequency of Communication with Friends and Family:  Never    Frequency of Social Gatherings with Friends and Family: More than three times a week    Attends Religious Services: More than 4 times per year    Active Member of [employer] or Organizations: No    Attends Archivist Meetings: Never    Marital Status: Married     Review of Systems  Constitutional:  Negative for appetite change and unexpected weight change.  HENT:  Negative for congestion and sinus pressure.   Respiratory:  Negative for cough, chest tightness and shortness of breath.   Cardiovascular:  Negative for chest pain, palpitations and leg swelling.  Gastrointestinal:  Negative for abdominal pain, diarrhea, nausea and vomiting.  Genitourinary:  Negative for difficulty urinating and dysuria.  Musculoskeletal:  Negative for joint swelling and myalgias.  Skin:  Negative for color change and rash.  Neurological:  Negative for dizziness and headaches.  Psychiatric/Behavioral:  Negative for agitation and dysphoric mood.        Objective:     BP 128/72   Pulse 71   Temp 97.9 F (36.6 C)   Resp 16   Ht 5' 6"$  (1.676 m)   Wt 226 lb (102.5 kg)   SpO2 98%   BMI 36.48 kg/m  Wt Readings from Last 3 Encounters:  06/30/22 226 lb (102.5 kg)  06/15/22 222 lb (100.7 kg)  05/05/22 231 lb (104.8 kg)    Physical Exam Vitals reviewed.  Constitutional:      General: She is not in acute distress.    Appearance: Normal appearance.  HENT:     Head: Normocephalic and atraumatic.     Right Ear: External ear normal.     Left Ear: External ear normal.  Eyes:     General: No scleral icterus.       Right eye: No discharge.        Left eye: No discharge.     Conjunctiva/sclera: Conjunctivae normal.  Neck:     Thyroid: No thyromegaly.  Cardiovascular:     Rate and Rhythm: Normal rate and regular rhythm.  Pulmonary:     Effort: No respiratory distress.     Breath sounds: Normal breath sounds. No wheezing.  Abdominal:     General: Bowel sounds are normal.      Palpations: Abdomen is soft.     Tenderness: There is no abdominal tenderness.  Musculoskeletal:        General: No swelling or tenderness.     Cervical back: Neck supple. No tenderness.  Lymphadenopathy:     Cervical: No cervical adenopathy.  Skin:  Findings: No erythema or rash.  Neurological:     Mental Status: She is alert.  Psychiatric:        Mood and Affect: Mood normal.        Behavior: Behavior normal.      Outpatient Encounter Medications as of 06/30/2022  Medication Sig   tirzepatide (ZEPBOUND) 2.5 MG/0.5ML Pen Inject 2.5 mg into the skin once a week.   amLODipine (NORVASC) 10 MG tablet Take 1 tablet (10 mg total) by mouth daily.   atenolol (TENORMIN) 25 MG tablet Take 1 tablet (25 mg total) by mouth daily.   ezetimibe (ZETIA) 10 MG tablet Take 1 tablet (10 mg total) by mouth daily.   FLUoxetine (PROZAC) 40 MG capsule Take 1 capsule (40 mg total) by mouth daily.   losartan-hydrochlorothiazide (HYZAAR) 100-12.5 MG tablet Take 1 tablet by mouth daily.   Vitamin D, Ergocalciferol, (DRISDOL) 1.25 MG (50000 UNIT) CAPS capsule Take 1 capsule (50,000 Units total) by mouth every 7 (seven) days.   No facility-administered encounter medications on file as of 06/30/2022.     Lab Results  Component Value Date   WBC 5.9 08/16/2021   HGB 13.9 08/16/2021   HCT 41.6 08/16/2021   PLT 258.0 08/16/2021   GLUCOSE 98 05/05/2022   CHOL 227 (H) 05/05/2022   TRIG 123.0 05/05/2022   HDL 98.20 05/05/2022   LDLCALC 105 (H) 05/05/2022   ALT 15 05/05/2022   AST 15 05/05/2022   NA 141 05/05/2022   K 4.1 05/05/2022   CL 105 05/05/2022   CREATININE 0.58 05/05/2022   BUN 15 05/05/2022   CO2 29 05/05/2022   TSH 1.990 12/23/2021   HGBA1C 5.6 05/05/2022    DG Chest 2 View  Result Date: 04/25/2022 CLINICAL DATA:  Fall, pain, bilateral rib pain EXAM: CHEST - 2 VIEW COMPARISON:  None Available. FINDINGS: Mild cardiomegaly. Both lungs are clear. Disc degenerative disease of the thoracic  spine. IMPRESSION: 1.  Mild cardiomegaly without acute abnormality of the lungs. 2.  No obvious displaced rib fractures on chest radiographs. Electronically Signed   By: Delanna Ahmadi M.D.   On: 04/25/2022 11:44       Assessment & Plan:  Essential hypertension -     Basic metabolic panel; Future  Hyperlipidemia, unspecified hyperlipidemia type Assessment & Plan: The 10-year ASCVD risk score (Arnett DK, et al., 2019) is: 21.5%   Values used to calculate the score:     Age: 46 years     Sex: Female     Is Non-Hispanic African American: No     Diabetic: No     Tobacco smoker: No     Systolic Blood Pressure: 0000000 mmHg     Is BP treated: Yes     HDL Cholesterol: 98.2 mg/dL     Total Cholesterol: 227 mg/dL  Have discussed calculated cholesterol risk. Had muscle aches with statin medication.  Zetia. work on diet and exercise.  Follow lipid panel.   Orders: -     Lipid panel; Future -     Hepatic function panel; Future  Hyperglycemia Assessment & Plan: Low carb diet and exercise. Follow met b and a1c.  Lab Results  Component Value Date   HGBA1C 5.6 05/05/2022    Orders: -     Hemoglobin A1c; Future  Visit for screening mammogram -     3D Screening Mammogram, Left and Right; Future  Gastroesophageal reflux disease, unspecified whether esophagitis present Assessment & Plan: 04/2022 EGD - Impression: - Normal  esophagus. Gastroesophageal flap valve classified as Hill Grade II (fold present, opens with respiration). Normal gastroesophageal junction, cardia, gastric fundus and gastric body, Gastritis. Biopsied. Normal examined duodenum.  No upper symptoms reported.    Depression, major, single episode, mild ([place]) Assessment & Plan: Continues on prozac.  Counseling as outlined. Follow. Overall appears to be doing better. Feels better.    Hypertension, unspecified type Assessment & Plan: Continue losartan/hctz, amlodipine and atenolol.  Blood pressure better.  Follow pressures.   Follow metabolic panel.     Major depressive disorder, single episode, mild ([facility]) Assessment & Plan: Continues on prozac.  Overall she feels she is doing ok.  Appears to be doing better. Follow.    Other nonthrombocytopenic purpura [facility]) Assessment & Plan: Per review has seen Dr Nehemiah Massed.  Continue f/u with dermatology.    Sleep apnea, unspecified type Assessment & Plan: CPAP.    Statin myopathy Assessment & Plan: Muscle aches with statin.  Zetia.    Obesity (BMI 30-39.9) Assessment & Plan: Discussed diet and exercise.  Evaluated at bariatric clinic.  Recommended zepbound.  Discussed today. Discussed possible side effects.  Rx sent in.  Follow.    Other orders -     Zepbound; Inject 2.5 mg into the skin once a week.  Dispense: 6 mL; Refill: 1     Einar Pheasant, MD

## 2022-07-03 ENCOUNTER — Encounter: Payer: Self-pay | Admitting: Internal Medicine

## 2022-07-03 NOTE — Progress Notes (Signed)
Chief Complaint:   OBESITY Sherry Davila is here to discuss her progress with her obesity treatment plan along with follow-up of her obesity related diagnoses. Miller is on keeping a food journal and adhering to recommended goals of 1500 calories and 85 grams protein and states she is following her eating plan approximately 0% of the time. Ivey states she is not currently exercising.  Today's visit was #: 6 Starting weight: 224 lbs Starting date: 12/23/2021 Today's weight: 222 lbs Today's date: 06/15/2022 Total lbs lost to date: 2 Total lbs lost since last in-office visit: 3  Interim History: Sherry Davila is getting prepared for bariatric surgery at [facility]. She has cut back on alcohol and started making dietary changes, but has had multiple birthday celebrations. Her husband is supportive. Pt is doing 1500 calories diet per [facility] recommendations. She is not exercising.  Subjective:   1. Chronic back pain, unspecified back location, unspecified back pain laterality Started with PM&R. Physical therapy wasn't helping much.  Pt can do some light walking. She denies pain medication use. Aleecia had her last corticosteroid injection in November.  2. Essential hypertension Poorly controlled. Jette is on amlodipine 10 mg daily, atenolol 25 mg daily, and Hyzaar 100-12.5 mg daily. She reports compliance with all meds.  3. Vitamin D deficiency On 03/22/2022, Renley's Vitamin D level was 31. She was taking prescription Vitamin D 50,000 IU weekly at her December visit.  Assessment/Plan:   1. Chronic back pain, unspecified back location, unspecified back pain laterality Exercise as tolerated. Consider water aerobics.  2. Essential hypertension Continue BP meds as prescribed. Look for BP improvements post bariatric surgery.  3. Vitamin D deficiency Continue current treatment plan.  Refill- Vitamin D, Ergocalciferol, (DRISDOL) 1.25 MG (50000 UNIT) CAPS capsule; Take 1 capsule (50,000 Units total) by mouth  every 7 (seven) days.  Dispense: 12 capsule; Refill: 0  4. Obesity,current BMI 36.9 Sherry Davila is currently in the action stage of change. As such, her goal is to continue with weight loss efforts. She has agreed to keeping a food journal and adhering to recommended goals of 1500 calories and 85 grams protein.   Low carb diet information given. Start 30 minutes of walking 3 times a week.  Exercise goals: All adults should avoid inactivity. Some physical activity is better than none, and adults who participate in any amount of physical activity gain some health benefits.  Behavioral modification strategies: increasing lean protein intake, increasing water intake, decreasing liquid calories, decreasing alcohol intake, decreasing eating out, no skipping meals, meal planning and cooking strategies, keeping healthy foods in the home, keeping a strict food journal, and decreasing junk food.  Sherry Davila has agreed to follow-up with our clinic in 12 weeks. She was informed of the importance of frequent follow-up visits to maximize her success with intensive lifestyle modifications for her multiple health conditions.   Objective:   Blood pressure (!) 146/78, pulse 69, temperature 99 F (37.2 C), height 5' 5"$  (1.651 m), weight 222 lb (100.7 kg), SpO2 98 %. Body mass index is 36.94 kg/m.  General: Cooperative, alert, well developed, in no acute distress. HEENT: Conjunctivae and lids unremarkable. Cardiovascular: Regular rhythm.  Lungs: Normal work of breathing. Neurologic: No focal deficits.   Lab Results  Component Value Date   CREATININE 0.58 05/05/2022   BUN 15 05/05/2022   NA 141 05/05/2022   K 4.1 05/05/2022   CL 105 05/05/2022   CO2 29 05/05/2022   Lab Results  Component Value Date  ALT 15 05/05/2022   AST 15 05/05/2022   ALKPHOS 116 05/05/2022   BILITOT 0.5 05/05/2022   Lab Results  Component Value Date   HGBA1C 5.6 05/05/2022   HGBA1C 5.5 11/29/2021   HGBA1C 5.5 06/23/2021    HGBA1C 5.6 10/07/2020   No results found for: "INSULIN" Lab Results  Component Value Date   TSH 1.990 12/23/2021   Lab Results  Component Value Date   CHOL 227 (H) 05/05/2022   HDL 98.20 05/05/2022   LDLCALC 105 (H) 05/05/2022   TRIG 123.0 05/05/2022   CHOLHDL 2 05/05/2022   Lab Results  Component Value Date   VD25OH 24.2 (L) 12/23/2021   Lab Results  Component Value Date   WBC 5.9 08/16/2021   HGB 13.9 08/16/2021   HCT 41.6 08/16/2021   MCV 92.4 08/16/2021   PLT 258.0 08/16/2021    Attestation Statements:   Reviewed by clinician on day of visit: allergies, medications, problem list, medical history, surgical history, family history, social history, and previous encounter notes.  Time spent on visit including pre-visit chart review and post-visit care and charting was 30 minutes.   I, Kathlene November, BS, CMA, am acting as transcriptionist for Loyal Gambler, DO.   I have reviewed the above documentation for accuracy and completeness, and I agree with the above. Dell Ponto, DO

## 2022-07-03 NOTE — Assessment & Plan Note (Signed)
Continue losartan/hctz, amlodipine and atenolol.  Blood pressure better.  Follow pressures.  Follow metabolic panel.

## 2022-07-03 NOTE — Assessment & Plan Note (Signed)
Muscle aches with statin.  Zetia.

## 2022-07-03 NOTE — Assessment & Plan Note (Signed)
CPAP.  

## 2022-07-03 NOTE — Assessment & Plan Note (Signed)
The 10-year ASCVD risk score (Arnett DK, et al., 2019) is: 21.5%   Values used to calculate the score:     Age: 77 years     Sex: Female     Is Non-Hispanic African American: No     Diabetic: No     Tobacco smoker: No     Systolic Blood Pressure: 0000000 mmHg     Is BP treated: Yes     HDL Cholesterol: 98.2 mg/dL     Total Cholesterol: 227 mg/dL  Have discussed calculated cholesterol risk. Had muscle aches with statin medication.  Zetia. work on diet and exercise.  Follow lipid panel.

## 2022-07-03 NOTE — Assessment & Plan Note (Signed)
Per review has seen Dr Nehemiah Massed.  Continue f/u with dermatology.

## 2022-07-03 NOTE — Assessment & Plan Note (Signed)
Continues on prozac.  Overall she feels she is doing ok.  Appears to be doing better. Follow.

## 2022-07-03 NOTE — Assessment & Plan Note (Signed)
Discussed diet and exercise.  Evaluated at bariatric clinic.  Recommended zepbound.  Discussed today. Discussed possible side effects.  Rx sent in.  Follow.

## 2022-07-03 NOTE — Assessment & Plan Note (Addendum)
Continues on prozac.  Counseling as outlined. Follow. Overall appears to be doing better. Feels better.

## 2022-07-03 NOTE — Assessment & Plan Note (Signed)
Low carb diet and exercise. Follow met b and a1c.  Lab Results  Component Value Date   HGBA1C 5.6 05/05/2022   

## 2022-07-03 NOTE — Assessment & Plan Note (Signed)
04/2022 EGD - Impression: - Normal esophagus. Gastroesophageal flap valve classified as Hill Grade II (fold present, opens with respiration). Normal gastroesophageal junction, cardia, gastric fundus and gastric body, Gastritis. Biopsied. Normal examined duodenum.  No upper symptoms reported.

## 2022-07-04 ENCOUNTER — Ambulatory Visit (INDEPENDENT_AMBULATORY_CARE_PROVIDER_SITE_OTHER): Payer: Medicare Other

## 2022-07-04 DIAGNOSIS — E669 Obesity, unspecified: Secondary | ICD-10-CM

## 2022-07-04 NOTE — Progress Notes (Signed)
Pt presented for her Zepbound teaching on today. Pt was identified through two identifiers. Pt was walked through the steps and was able to administer her first dose in her right thigh with no problems.

## 2022-07-07 ENCOUNTER — Ambulatory Visit: Payer: Medicare Other

## 2022-07-13 ENCOUNTER — Encounter: Payer: Self-pay | Admitting: Family Medicine

## 2022-07-13 ENCOUNTER — Ambulatory Visit (INDEPENDENT_AMBULATORY_CARE_PROVIDER_SITE_OTHER): Payer: Medicare Other | Admitting: Family Medicine

## 2022-07-13 VITALS — BP 122/80 | HR 83 | Temp 98.2°F | Ht 66.0 in | Wt 215.2 lb

## 2022-07-13 DIAGNOSIS — J989 Respiratory disorder, unspecified: Secondary | ICD-10-CM

## 2022-07-13 DIAGNOSIS — J40 Bronchitis, not specified as acute or chronic: Secondary | ICD-10-CM | POA: Insufficient documentation

## 2022-07-13 MED ORDER — GUAIFENESIN-CODEINE 100-10 MG/5ML PO SOLN: 10.0000 mL | Freq: Three times a day (TID) | ORAL | 0 refills | Status: DC | PRN

## 2022-07-13 MED ORDER — AMOXICILLIN-POT CLAVULANATE 875-125 MG PO TABS: 1.0000 | ORAL_TABLET | Freq: Two times a day (BID) | ORAL | 0 refills | Status: DC

## 2022-07-13 NOTE — Patient Instructions (Signed)
Nice to see you. I am going to treat you for possible sinusitis vs bronchitis. Please take the entire course of augmentin. If you develop diarrhea with this please let us know.  You can use the cough syrup as prescribed. If it makes you excessively drowsy please stop taking it and let us know. Do not drive while taking it.  If your symptoms are not improving or if the worsen please let us know.

## 2022-07-13 NOTE — Assessment & Plan Note (Signed)
Suspect patient has sinusitis versus bronchitis.  Given duration of symptoms we will proceed with treat with antibiotics.  I will start her on Augmentin 1 tablet twice daily for 7 days.  She will monitor for diarrhea with this.  I will treat her cough with codeine cough syrup.  Advised on the risk of drowsiness with this and advised not to drive while taking this.  If she is excessively drowsy with taking the cough syrup she will let us know and she will stop taking the cough syrup.  If her symptoms or not improving or if she has any worsening symptoms she will contact us right away.

## 2022-07-13 NOTE — Progress Notes (Signed)
Sherry Rumps, MD Phone: [phone]  Sherry Davila is a 77 y.o. female who presents today for same day visit.   Respiratory illness: Patient notes onset of symptoms about 10 days ago.  Started with head cold symptoms with congestion, sore throat, and postnasal drip.  The head congestion has improved some though she has been coughing over the last few days with her postnasal drip.  She notes some chest congestion.  No fevers or shortness of breath.  Does have headache from the cough.  No taste or smell disturbances.  No known COVID or flu exposures.  She did not test for COVID.  Over-the-counter cold and flu medications have not been terribly beneficial.  Social History   Tobacco Use  Smoking Status Former   Types: Cigarettes   Quit date: 01/19/1988   Years since quitting: 34.5  Smokeless Tobacco Never    Current Outpatient Medications on File Prior to Visit  Medication Sig Dispense Refill   amLODipine (NORVASC) 10 MG tablet Take 1 tablet (10 mg total) by mouth daily. 90 tablet 1   atenolol (TENORMIN) 25 MG tablet Take 1 tablet (25 mg total) by mouth daily. 90 tablet 3   ezetimibe (ZETIA) 10 MG tablet Take 1 tablet (10 mg total) by mouth daily. 30 tablet 11   FLUoxetine (PROZAC) 40 MG capsule Take 1 capsule (40 mg total) by mouth daily. 90 capsule 1   losartan-hydrochlorothiazide (HYZAAR) 100-12.5 MG tablet Take 1 tablet by mouth daily. 90 tablet 1   tirzepatide (ZEPBOUND) 2.5 MG/0.5ML Pen Inject 2.5 mg into the skin once a week. 6 mL 1   Vitamin D, Ergocalciferol, (DRISDOL) 1.25 MG (50000 UNIT) CAPS capsule Take 1 capsule (50,000 Units total) by mouth every 7 (seven) days. 12 capsule 0   No current facility-administered medications on file prior to visit.     ROS see history of present illness  Objective  Physical Exam Vitals:   07/13/22 1017 07/13/22 1024  BP: 128/82 122/80  Pulse: 83   Temp: 98.2 F (36.8 C)   SpO2: 98%     BP Readings from Last 3 Encounters:   07/13/22 122/80  06/30/22 128/72  06/15/22 (!) 146/78   Wt Readings from Last 3 Encounters:  07/13/22 215 lb 3.2 oz (97.6 kg)  06/30/22 226 lb (102.5 kg)  06/15/22 222 lb (100.7 kg)    Physical Exam Constitutional:      General: She is not in acute distress.    Appearance: She is not diaphoretic.  HENT:     Left Ear: Tympanic membrane normal.     Ears:     Comments: Right TM obscured by cerumen Cardiovascular:     Rate and Rhythm: Normal rate and regular rhythm.     Heart sounds: Normal heart sounds.  Pulmonary:     Effort: Pulmonary effort is normal.     Breath sounds: Normal breath sounds.  Lymphadenopathy:     Cervical: No cervical adenopathy.  Skin:    General: Skin is warm and dry.  Neurological:     Mental Status: She is alert.      Assessment/Plan: Please see individual problem list.  Respiratory illness Assessment & Plan: Suspect patient has sinusitis versus bronchitis.  Given duration of symptoms we will proceed with treat with antibiotics.  I will start her on Augmentin 1 tablet twice daily for 7 days.  She will monitor for diarrhea with this.  I will treat her cough with codeine cough syrup.  Advised on the  risk of drowsiness with this and advised not to drive while taking this.  If she is excessively drowsy with taking the cough syrup she will let us know and she will stop taking the cough syrup.  If her symptoms or not improving or if she has any worsening symptoms she will contact us right away.  Orders: -     Amoxicillin-Pot Clavulanate; Take 1 tablet by mouth 2 (two) times daily.  Dispense: 14 tablet; Refill: 0 -     guaiFENesin-Codeine; Take 10 mLs by mouth 3 (three) times daily as needed for cough.  Dispense: 120 mL; Refill: 0    Return if symptoms worsen or fail to improve.   Sherry Rumps, MD [place]

## 2022-07-18 ENCOUNTER — Encounter: Payer: Self-pay | Admitting: Family

## 2022-07-18 ENCOUNTER — Telehealth (INDEPENDENT_AMBULATORY_CARE_PROVIDER_SITE_OTHER): Payer: Medicare Other | Admitting: Family

## 2022-07-18 VITALS — Ht 66.0 in | Wt 215.4 lb

## 2022-07-18 DIAGNOSIS — R051 Acute cough: Secondary | ICD-10-CM | POA: Diagnosis not present

## 2022-07-18 DIAGNOSIS — J40 Bronchitis, not specified as acute or chronic: Secondary | ICD-10-CM

## 2022-07-18 LAB — POC COVID19 BINAXNOW: SARS Coronavirus 2 Ag: NEGATIVE

## 2022-07-18 LAB — POCT INFLUENZA A/B
Influenza A, POC: NEGATIVE
Influenza B, POC: NEGATIVE

## 2022-07-18 MED ORDER — BENZONATATE 100 MG PO CAPS: 100.0000 mg | ORAL_CAPSULE | Freq: Three times a day (TID) | ORAL | 0 refills | Status: DC | PRN

## 2022-07-18 MED ORDER — HYDROCOD POLI-CHLORPHE POLI ER 10-8 MG/5ML PO SUER: 5.0000 mL | Freq: Every evening | ORAL | 0 refills | Status: DC | PRN

## 2022-07-18 NOTE — Assessment & Plan Note (Addendum)
Negative flu, COVID.  No acute respiratory distress. congestion has moved from sinus to chest.  Advised to complete Augmentin.  We agreed more likely to be viral etiology.  We will first try conservative therapy with use of Cheratussin, Mucinex and Tessalon Perles during the day.  Patient will let me know if no improvement or certainly worsening.  We discussed chest x-ray and starting azithromycin.

## 2022-07-18 NOTE — Patient Instructions (Addendum)
Please complete augmentin.   I provided you with 2 different cough medications; one is more daytime preparation, Tessalon Perles.  I have also provided you with a codeine-based preparation called Tussionex to use INSTEAD of Cheratussin (guaifenesin-codeine as prescribed by Dr Biagio Quint). Do not use both cough syrups together as this would be too sedating.   Please take cough medication at night only as needed. As we discussed, I do not recommend dosing throughout the day as coughing is a protective mechanism . It also helps to break up thick mucous.  Do not take cough suppressants with alcohol as can lead to trouble breathing. Advise caution if taking cough suppressant and operating machinery ( i.e driving a car) as you may feel very tired.

## 2022-07-18 NOTE — Progress Notes (Signed)
Virtual Visit via Video Note  I connected with Sherry Davila on 07/18/22 at 12:00 PM EST by a video enabled telemedicine application and verified that I am speaking with the correct person using two identifiers. Location patient: home Location provider: work  Persons participating in the virtual visit: patient, provider  I discussed the limitations of evaluation and management by telemedicine and the availability of in person appointments. The patient expressed understanding and agreed to proceed.  HPI: Complains of congestion for approximately 14 days.   Noticed more chest congestion as  previously had been more sinus congestion. Most bothered dry cough during the day and worse ar night and feels there is congestion she needs to get rid of. Augmentin nor Cheratussin has not been helpful. She has been taking nyquil , dayquil.   No sob, wheezing, ear pain, sore throat  5 days ago seen by Dr Caryl Bis and started on Augmentin, guaifenesin codeine H/o OSA, HTN  No seasonal allergies.  No h/o CKD   ROS: See pertinent positives and negatives per HPI.  EXAM:  VITALS per patient if applicable: Ht '5\' 6"'$  (1.676 m)   Wt 215 lb 6.4 oz (97.7 kg)   BMI 34.77 kg/m  BP Readings from Last 3 Encounters:  07/13/22 122/80  06/30/22 128/72  06/15/22 (!) 146/78   Wt Readings from Last 3 Encounters:  07/18/22 215 lb 6.4 oz (97.7 kg)  07/13/22 215 lb 3.2 oz (97.6 kg)  06/30/22 226 lb (102.5 kg)    GENERAL: alert, oriented, appears well and in no acute distress  HEENT: atraumatic, conjunttiva clear, no obvious abnormalities on inspection of external nose and ears  NECK: normal movements of the head and neck  LUNGS: on inspection no signs of respiratory distress, breathing rate appears normal, no obvious gross SOB, gasping or wheezing  CV: no obvious cyanosis  MS: moves all visible extremities without noticeable abnormality  PSYCH/NEURO: pleasant and cooperative, no obvious  depression or anxiety, speech and thought processing grossly intact  ASSESSMENT AND PLAN: Bronchitis Assessment & Plan: Negative flu, COVID.  No acute respiratory distress. congestion has moved from sinus to chest.  Advised to complete Augmentin.  We agreed more likely to be viral etiology.  We will first try conservative therapy with use of Cheratussin, Mucinex and Tessalon Perles during the day.  Patient will let me know if no improvement or certainly worsening.  We discussed chest x-ray and starting azithromycin.  Orders: -     Hydrocod Poli-Chlorphe Poli ER; Take 5 mLs by mouth at bedtime as needed for cough.  Dispense: 60 mL; Refill: 0 -     Benzonatate; Take 1 capsule (100 mg total) by mouth 3 (three) times daily as needed for cough.  Dispense: 30 capsule; Refill: 0  Acute cough -     POC COVID-19 BinaxNow -     POCT Influenza A/B     -we discussed possible serious and likely etiologies, options for evaluation and workup, limitations of telemedicine visit vs in person visit, treatment, treatment risks and precautions. Pt prefers to treat via telemedicine empirically rather then risking or undertaking an in person visit at this moment.    I discussed the assessment and treatment plan with the patient. The patient was provided an opportunity to ask questions and all were answered. The patient agreed with the plan and demonstrated an understanding of the instructions.   The patient was advised to call back or seek an in-person evaluation if the symptoms worsen or if  the condition fails to improve as anticipated.  Advised if desired AVS can be mailed or viewed via [place] if [place] user.   Mable Paris, FNP

## 2022-07-20 ENCOUNTER — Other Ambulatory Visit: Payer: Self-pay | Admitting: Family

## 2022-07-20 ENCOUNTER — Telehealth: Payer: Self-pay | Admitting: Internal Medicine

## 2022-07-20 ENCOUNTER — Other Ambulatory Visit: Payer: Medicare Other

## 2022-07-20 ENCOUNTER — Ambulatory Visit (INDEPENDENT_AMBULATORY_CARE_PROVIDER_SITE_OTHER): Payer: Medicare Other

## 2022-07-20 DIAGNOSIS — R059 Cough, unspecified: Secondary | ICD-10-CM | POA: Diagnosis not present

## 2022-07-20 DIAGNOSIS — J40 Bronchitis, not specified as acute or chronic: Secondary | ICD-10-CM | POA: Diagnosis not present

## 2022-07-20 MED ORDER — AZITHROMYCIN 250 MG PO TABS: ORAL_TABLET | ORAL | 0 refills | Status: AC

## 2022-07-20 NOTE — Telephone Encounter (Signed)
Pt would like to come in and do a chest xray because she is still not feeling better

## 2022-07-20 NOTE — Telephone Encounter (Signed)
Spoke to pt and scheduled her for Chest xray for today.. Can you please order

## 2022-07-20 NOTE — Telephone Encounter (Signed)
CXR ordered 

## 2022-07-28 ENCOUNTER — Ambulatory Visit (INDEPENDENT_AMBULATORY_CARE_PROVIDER_SITE_OTHER): Payer: Medicare Other | Admitting: Internal Medicine

## 2022-07-28 ENCOUNTER — Encounter: Payer: Self-pay | Admitting: Internal Medicine

## 2022-07-28 VITALS — BP 128/78 | HR 80 | Temp 98.0°F | Resp 16 | Ht 66.0 in | Wt 212.8 lb

## 2022-07-28 DIAGNOSIS — F320 Major depressive disorder, single episode, mild: Secondary | ICD-10-CM

## 2022-07-28 DIAGNOSIS — G720 Drug-induced myopathy: Secondary | ICD-10-CM

## 2022-07-28 DIAGNOSIS — G4730 Sleep apnea, unspecified: Secondary | ICD-10-CM

## 2022-07-28 DIAGNOSIS — R739 Hyperglycemia, unspecified: Secondary | ICD-10-CM | POA: Diagnosis not present

## 2022-07-28 DIAGNOSIS — K219 Gastro-esophageal reflux disease without esophagitis: Secondary | ICD-10-CM

## 2022-07-28 DIAGNOSIS — E669 Obesity, unspecified: Secondary | ICD-10-CM | POA: Diagnosis not present

## 2022-07-28 DIAGNOSIS — E785 Hyperlipidemia, unspecified: Secondary | ICD-10-CM

## 2022-07-28 DIAGNOSIS — T466X5A Adverse effect of antihyperlipidemic and antiarteriosclerotic drugs, initial encounter: Secondary | ICD-10-CM

## 2022-07-28 DIAGNOSIS — J40 Bronchitis, not specified as acute or chronic: Secondary | ICD-10-CM

## 2022-07-28 DIAGNOSIS — I10 Essential (primary) hypertension: Secondary | ICD-10-CM

## 2022-07-28 MED ORDER — PREDNISONE 10 MG PO TABS: ORAL_TABLET | ORAL | 0 refills | Status: DC

## 2022-07-28 NOTE — Progress Notes (Signed)
Subjective:    Patient ID: Sherry Davila, female    DOB: [birth year], 77 y.o.   MRN: [id number]  Patient here for  Chief Complaint  Patient presents with   Medical Management of Chronic Issues    Follow up from acute visit with NP    HPI Here to follow up regarding  increased stress and blood pressure. Has seen ortho and physiatry - for low back pain.  Also has established with bariatric clinic. Discussed trial of GLP-1 agonist last visit. Started on zepbound. Tolerating.  Recently evaluated 07/18/22 - cough - diagnosed with bronchitis.  Treated with cheratussin, mucinex and tessalon perles.  Had previously been seen 2/21 - prescribed augmentin.  Reports persistent nasal congestion and increased drainage.  Also persistent cough. No abdominal pain or bowel change reported.    Past Medical History:  Diagnosis Date   Actinic keratosis 04/04/2018   L nose lat to supra tip   Anxiety    Basal cell carcinoma 04/04/2018   R prox nasal ala   Depression    Dysplastic nevus 05/07/2013   R abdomen - mod to severe, excision 05/29/2013   Hyperlipidemia    Hypertension    Obesity    Sleep apnea    has a cpap, doesn't wear    Varicose vein    Past Surgical History:  Procedure Laterality Date   COLONOSCOPY WITH PROPOFOL N/A 09/08/2016   Procedure: COLONOSCOPY WITH PROPOFOL;  Surgeon: Jonathon Bellows, MD;  Location: [facility] ENDOSCOPY;  Service: Endoscopy;  Laterality: N/A;   COLONOSCOPY WITH PROPOFOL N/A 01/07/2020   Procedure: COLONOSCOPY WITH PROPOFOL;  Surgeon: Jonathon Bellows, MD;  Location: [facility] ENDOSCOPY;  Service: Gastroenterology;  Laterality: N/A;   FRACTURE SURGERY     foot, screws placed   JOINT REPLACEMENT Right 2009   knee   Family History  Problem Relation Age of Onset   Cancer Mother    Heart failure Mother    Heart disease Mother    Breast cancer Mother 67   Heart attack Mother    Obesity Father    Depression Father    Early death Father    Hyperlipidemia Father    AAA  (abdominal aortic aneurysm) Father    Hypertension Father    Heart disease Father    Hypertension Sister    Social History   Socioeconomic History   Marital status: Married    Spouse name: Not on file   Number of children: Not on file   Years of education: Not on file   Highest education level: Not on file  Occupational History   Not on file  Tobacco Use   Smoking status: Former    Types: Cigarettes    Quit date: 01/19/1988    Years since quitting: 34.5   Smokeless tobacco: Never  Vaping Use   Vaping Use: Never used  Substance and Sexual Activity   Alcohol use: Yes    Alcohol/week: 3.0 standard drinks of alcohol    Types: 3 Shots of liquor per week    Comment: 3 drinks a week socially    Drug use: No   Sexual activity: Not on file  Other Topics Concern   Not on file  Social History Narrative   Not on file   Social Determinants of Health   Financial Resource Strain: Low Risk  (10/11/2021)   Overall Financial Resource Strain (CARDIA)    Difficulty of Paying Living Expenses: Not hard at all  Food Insecurity: No Food Insecurity (10/11/2021)  Hunger Vital Sign    Worried About Running Out of Food in the Last Year: Never true    Ran Out of Food in the Last Year: Never true  Transportation Needs: No Transportation Needs (10/11/2021)   PRAPARE - Hydrologist (Medical): No    Lack of Transportation (Non-Medical): No  Physical Activity: Insufficiently Active (10/11/2021)   Exercise Vital Sign    Days of Exercise per Week: 3 days    Minutes of Exercise per Session: 30 min  Stress: No Stress Concern Present (10/11/2021)   [place]    Feeling of Stress : Not at all  Social Connections: Moderately Integrated (10/11/2021)   Social Connection and Isolation Panel [NHANES]    Frequency of Communication with Friends and Family: Never    Frequency of Social Gatherings with Friends and  Family: More than three times a week    Attends Religious Services: More than 4 times per year    Active Member of [employer] or Organizations: No    Attends Archivist Meetings: Never    Marital Status: Married     Review of Systems  Constitutional:  Negative for appetite change and unexpected weight change.  HENT:  Positive for congestion and postnasal drip.   Respiratory:  Positive for cough. Negative for chest tightness.   Cardiovascular:  Negative for chest pain and palpitations.  Gastrointestinal:  Negative for abdominal pain, diarrhea and vomiting.  Genitourinary:  Negative for difficulty urinating and dysuria.  Musculoskeletal:  Negative for joint swelling and myalgias.  Skin:  Negative for color change and rash.  Neurological:  Negative for dizziness and headaches.  Psychiatric/Behavioral:  Negative for agitation and dysphoric mood.        Objective:     BP 128/78   Pulse 80   Temp 98 F (36.7 C)   Resp 16   Ht 5\' 6"  (1.676 m)   Wt 212 lb 12.8 oz (96.5 kg)   SpO2 98%   BMI 34.35 kg/m  Wt Readings from Last 3 Encounters:  07/28/22 212 lb 12.8 oz (96.5 kg)  07/18/22 215 lb 6.4 oz (97.7 kg)  07/13/22 215 lb 3.2 oz (97.6 kg)    Physical Exam Vitals reviewed.  Constitutional:      General: She is not in acute distress.    Appearance: Normal appearance.  HENT:     Head: Normocephalic and atraumatic.     Right Ear: External ear normal.     Left Ear: External ear normal.  Eyes:     General: No scleral icterus.       Right eye: No discharge.        Left eye: No discharge.     Conjunctiva/sclera: Conjunctivae normal.  Neck:     Thyroid: No thyromegaly.  Cardiovascular:     Rate and Rhythm: Normal rate and regular rhythm.  Pulmonary:     Effort: No respiratory distress.     Breath sounds: Normal breath sounds. No wheezing.  Abdominal:     General: Bowel sounds are normal.     Palpations: Abdomen is soft.     Tenderness: There is no abdominal  tenderness.  Musculoskeletal:        General: No swelling or tenderness.     Cervical back: Neck supple. No tenderness.  Lymphadenopathy:     Cervical: No cervical adenopathy.  Skin:    Findings: No erythema or rash.  Neurological:  Mental Status: She is alert.  Psychiatric:        Mood and Affect: Mood normal.        Behavior: Behavior normal.      Outpatient Encounter Medications as of 07/28/2022  Medication Sig   predniSONE (DELTASONE) 10 MG tablet Take 4 tablets x 1 day and then decrease by 1/2 tablet per day until down to zero mg.   amLODipine (NORVASC) 10 MG tablet Take 1 tablet (10 mg total) by mouth daily.   atenolol (TENORMIN) 25 MG tablet Take 1 tablet (25 mg total) by mouth daily.   ezetimibe (ZETIA) 10 MG tablet Take 1 tablet (10 mg total) by mouth daily.   FLUoxetine (PROZAC) 40 MG capsule Take 1 capsule (40 mg total) by mouth daily.   losartan-hydrochlorothiazide (HYZAAR) 100-12.5 MG tablet Take 1 tablet by mouth daily.   tirzepatide (ZEPBOUND) 2.5 MG/0.5ML Pen Inject 2.5 mg into the skin once a week.   Vitamin D, Ergocalciferol, (DRISDOL) 1.25 MG (50000 UNIT) CAPS capsule Take 1 capsule (50,000 Units total) by mouth every 7 (seven) days.   [DISCONTINUED] amoxicillin-clavulanate (AUGMENTIN) 875-125 MG tablet Take 1 tablet by mouth 2 (two) times daily.   [DISCONTINUED] benzonatate (TESSALON) 100 MG capsule Take 1 capsule (100 mg total) by mouth 3 (three) times daily as needed for cough.   [DISCONTINUED] chlorpheniramine-HYDROcodone (TUSSIONEX) 10-8 MG/5ML Take 5 mLs by mouth at bedtime as needed for cough.   No facility-administered encounter medications on file as of 07/28/2022.     Lab Results  Component Value Date   WBC 5.9 08/16/2021   HGB 13.9 08/16/2021   HCT 41.6 08/16/2021   PLT 258.0 08/16/2021   GLUCOSE 98 05/05/2022   CHOL 227 (H) 05/05/2022   TRIG 123.0 05/05/2022   HDL 98.20 05/05/2022   LDLCALC 105 (H) 05/05/2022   ALT 15 05/05/2022   AST 15  05/05/2022   NA 141 05/05/2022   K 4.1 05/05/2022   CL 105 05/05/2022   CREATININE 0.58 05/05/2022   BUN 15 05/05/2022   CO2 29 05/05/2022   TSH 1.990 12/23/2021   HGBA1C 5.6 05/05/2022    DG Chest 2 View  Result Date: 04/25/2022 CLINICAL DATA:  Fall, pain, bilateral rib pain EXAM: CHEST - 2 VIEW COMPARISON:  None Available. FINDINGS: Mild cardiomegaly. Both lungs are clear. Disc degenerative disease of the thoracic spine. IMPRESSION: 1.  Mild cardiomegaly without acute abnormality of the lungs. 2.  No obvious displaced rib fractures on chest radiographs. Electronically Signed   By: Delanna Ahmadi M.D.   On: 04/25/2022 11:44       Assessment & Plan:  Gastroesophageal reflux disease, unspecified whether esophagitis present Assessment & Plan: 04/2022 EGD - Impression: - Normal esophagus. Gastroesophageal flap valve classified as Hill Grade II (fold present, opens with respiration). Normal gastroesophageal junction, cardia, gastric fundus and gastric body, Gastritis. Biopsied. Normal examined duodenum.  No upper symptoms reported.    Bronchitis Assessment & Plan: Negative flu and covid previously.  Persistent cough and congestion.  Has been treated with augmentin, cheratussin, mucinex and tessalon perles.  CXR 07/20/22 - Mild diffuse bilateral interstitial pulmonary opacity, consistent with edema or atypical/viral infection. No focal airspace opacity.  Scheduled for f/u cxr.  Treat with prednisone taper as directed.  Nasacort nasal spray - to help with nasal congestion and drainage. Follow.  Call with update.     Depression, major, single episode, mild ([place]) Assessment & Plan: Continues on prozac.  Follow. Overall appears to be doing  better. Feels better.    Hyperglycemia Assessment & Plan: Low carb diet and exercise. Follow met b and a1c.  Lab Results  Component Value Date   HGBA1C 5.6 05/05/2022     Hyperlipidemia, unspecified hyperlipidemia type Assessment & Plan: The  10-year ASCVD risk score (Arnett DK, et al., 2019) is: 21.5%   Values used to calculate the score:     Age: 67 years     Sex: Female     Is Non-Hispanic African American: No     Diabetic: No     Tobacco smoker: No     Systolic Blood Pressure: 416 mmHg     Is BP treated: Yes     HDL Cholesterol: 98.2 mg/dL     Total Cholesterol: 227 mg/dL  Have discussed calculated cholesterol risk. Had muscle aches with statin medication.  Zetia. work on diet and exercise.  Follow lipid panel.    Hypertension, unspecified type Assessment & Plan: Continue losartan/hctz, amlodipine and atenolol.  Blood pressure better.  Follow pressures.  Follow metabolic panel.     Sleep apnea, unspecified type Assessment & Plan: CPAP.    Statin myopathy Assessment & Plan: Muscle aches with statin.  Zetia.    Obesity (BMI 30-39.9) Assessment & Plan: Tolerating zepbound.  Follow.    Other orders -     predniSONE; Take 4 tablets x 1 day and then decrease by 1/2 tablet per day until down to zero mg.  Dispense: 18 tablet; Refill: 0     Einar Pheasant, MD

## 2022-07-28 NOTE — Patient Instructions (Signed)
Nasacort nasal spray - 2 sprays each nostril one time per day.  Do this in the evening.

## 2022-08-03 ENCOUNTER — Ambulatory Visit: Payer: Medicare Other | Admitting: Dermatology

## 2022-08-06 ENCOUNTER — Encounter: Payer: Self-pay | Admitting: Internal Medicine

## 2022-08-06 NOTE — Assessment & Plan Note (Signed)
Continue losartan/hctz, amlodipine and atenolol.  Blood pressure better.  Follow pressures.  Follow metabolic panel.   

## 2022-08-06 NOTE — Assessment & Plan Note (Signed)
Muscle aches with statin.  Zetia.  

## 2022-08-06 NOTE — Assessment & Plan Note (Signed)
CPAP.  

## 2022-08-06 NOTE — Assessment & Plan Note (Addendum)
Negative flu and covid previously.  Persistent cough and congestion.  Has been treated with augmentin, cheratussin, mucinex and tessalon perles.  CXR 07/20/22 - Mild diffuse bilateral interstitial pulmonary opacity, consistent with edema or atypical/viral infection. No focal airspace opacity.  Scheduled for f/u cxr.  Treat with prednisone taper as directed.  Nasacort nasal spray - to help with nasal congestion and drainage. Follow.  Call with update.

## 2022-08-06 NOTE — Assessment & Plan Note (Signed)
Continues on prozac.  Follow. Overall appears to be doing better. Feels better.

## 2022-08-06 NOTE — Assessment & Plan Note (Signed)
Low carb diet and exercise. Follow met b and a1c.  Lab Results  Component Value Date   HGBA1C 5.6 05/05/2022   

## 2022-08-06 NOTE — Assessment & Plan Note (Signed)
04/2022 EGD - Impression: - Normal esophagus. Gastroesophageal flap valve classified as Hill Grade II (fold present, opens with respiration). Normal gastroesophageal junction, cardia, gastric fundus and gastric body, Gastritis. Biopsied. Normal examined duodenum.  No upper symptoms reported.  

## 2022-08-06 NOTE — Assessment & Plan Note (Signed)
The 10-year ASCVD risk score (Arnett DK, et al., 2019) is: 21.5%   Values used to calculate the score:     Age: 76 years     Sex: Female     Is Non-Hispanic African American: No     Diabetic: No     Tobacco smoker: No     Systolic Blood Pressure: 128 mmHg     Is BP treated: Yes     HDL Cholesterol: 98.2 mg/dL     Total Cholesterol: 227 mg/dL  Have discussed calculated cholesterol risk. Had muscle aches with statin medication.  Zetia. work on diet and exercise.  Follow lipid panel.  

## 2022-08-06 NOTE — Assessment & Plan Note (Signed)
Tolerating zepbound.  Follow.

## 2022-08-10 ENCOUNTER — Encounter: Payer: Self-pay | Admitting: Internal Medicine

## 2022-08-10 MED ORDER — ZEPBOUND 5 MG/0.5ML ~~LOC~~ SOAJ: 5.0000 mg | SUBCUTANEOUS | 1 refills | Status: DC

## 2022-08-10 NOTE — Telephone Encounter (Signed)
I have sent in the prescription for the 5mg  dose.  She is requesting the 2.5mg  be canceled.  I have changed it in the computer.  Please call and cancel at pharmacy and please let pt know 5mg  dose has been called in.  Thanks.

## 2022-08-15 ENCOUNTER — Other Ambulatory Visit (INDEPENDENT_AMBULATORY_CARE_PROVIDER_SITE_OTHER): Payer: Medicare Other

## 2022-08-15 ENCOUNTER — Other Ambulatory Visit: Payer: Medicare Other

## 2022-08-15 ENCOUNTER — Ambulatory Visit (INDEPENDENT_AMBULATORY_CARE_PROVIDER_SITE_OTHER): Payer: Medicare Other

## 2022-08-15 DIAGNOSIS — E785 Hyperlipidemia, unspecified: Secondary | ICD-10-CM

## 2022-08-15 DIAGNOSIS — R739 Hyperglycemia, unspecified: Secondary | ICD-10-CM | POA: Diagnosis not present

## 2022-08-15 DIAGNOSIS — J40 Bronchitis, not specified as acute or chronic: Secondary | ICD-10-CM | POA: Diagnosis not present

## 2022-08-15 DIAGNOSIS — I10 Essential (primary) hypertension: Secondary | ICD-10-CM

## 2022-08-15 DIAGNOSIS — Z0389 Encounter for observation for other suspected diseases and conditions ruled out: Secondary | ICD-10-CM | POA: Diagnosis not present

## 2022-08-15 LAB — HEMOGLOBIN A1C: Hgb A1c MFr Bld: 5.4 % (ref 4.6–6.5)

## 2022-08-15 LAB — BASIC METABOLIC PANEL
BUN: 17 mg/dL (ref 6–23)
CO2: 28 mEq/L (ref 19–32)
Calcium: 9.5 mg/dL (ref 8.4–10.5)
Chloride: 100 mEq/L (ref 96–112)
Creatinine, Ser: 0.72 mg/dL (ref 0.40–1.20)
GFR: 81.86 mL/min (ref >60.00)
Glucose, Bld: 88 mg/dL (ref 70–99)
Potassium: 4.4 mEq/L (ref 3.5–5.1)
Sodium: 137 mEq/L (ref 135–145)

## 2022-08-15 LAB — LIPID PANEL
Cholesterol: 244 mg/dL — ABNORMAL HIGH (ref 0–200)
HDL: 85.1 mg/dL (ref >39.00)
LDL Cholesterol: 143 mg/dL — ABNORMAL HIGH (ref 0–99)
NonHDL: 158.67
Total CHOL/HDL Ratio: 3
Triglycerides: 79 mg/dL (ref 0.0–149.0)
VLDL: 15.8 mg/dL (ref 0.0–40.0)

## 2022-08-15 LAB — HEPATIC FUNCTION PANEL
ALT: 17 U/L (ref 0–35)
AST: 17 U/L (ref 0–37)
Albumin: 4.3 g/dL (ref 3.5–5.2)
Alkaline Phosphatase: 99 U/L (ref 39–117)
Bilirubin, Direct: 0.2 mg/dL (ref 0.0–0.3)
Total Bilirubin: 0.8 mg/dL (ref 0.2–1.2)
Total Protein: 6.6 g/dL (ref 6.0–8.3)

## 2022-08-16 MED ORDER — ZEPBOUND 5 MG/0.5ML ~~LOC~~ SOAJ: 5.0000 mg | SUBCUTANEOUS | 1 refills | Status: DC

## 2022-08-16 NOTE — Telephone Encounter (Signed)
Prescription Request  08/16/2022  LOV: 07/28/2022  What is the name of the medication or equipment? tirzepatide (ZEPBOUND) 5 MG/0.5ML Pen  Have you contacted your pharmacy to request a refill? Yes   Which pharmacy would you like this sent to?   [facility].    Patient notified that their request is being sent to the clinical staff for review and that they should receive a response within 2 business days.   Please advise at Mobile [phone] (mobile)

## 2022-08-17 ENCOUNTER — Encounter: Payer: Self-pay | Admitting: Dermatology

## 2022-08-17 ENCOUNTER — Ambulatory Visit (INDEPENDENT_AMBULATORY_CARE_PROVIDER_SITE_OTHER): Payer: Medicare Other | Admitting: Dermatology

## 2022-08-17 VITALS — BP 119/69 | HR 64

## 2022-08-17 DIAGNOSIS — Z86018 Personal history of other benign neoplasm: Secondary | ICD-10-CM | POA: Diagnosis not present

## 2022-08-17 DIAGNOSIS — Z1283 Encounter for screening for malignant neoplasm of skin: Secondary | ICD-10-CM

## 2022-08-17 DIAGNOSIS — L578 Other skin changes due to chronic exposure to nonionizing radiation: Secondary | ICD-10-CM

## 2022-08-17 DIAGNOSIS — L821 Other seborrheic keratosis: Secondary | ICD-10-CM

## 2022-08-17 DIAGNOSIS — L820 Inflamed seborrheic keratosis: Secondary | ICD-10-CM | POA: Diagnosis not present

## 2022-08-17 DIAGNOSIS — D229 Melanocytic nevi, unspecified: Secondary | ICD-10-CM

## 2022-08-17 DIAGNOSIS — Z85828 Personal history of other malignant neoplasm of skin: Secondary | ICD-10-CM | POA: Diagnosis not present

## 2022-08-17 DIAGNOSIS — L814 Other melanin hyperpigmentation: Secondary | ICD-10-CM | POA: Diagnosis not present

## 2022-08-17 DIAGNOSIS — D1801 Hemangioma of skin and subcutaneous tissue: Secondary | ICD-10-CM

## 2022-08-17 NOTE — Progress Notes (Signed)
   Follow-Up Visit   Subjective  Sherry Davila is a 77 y.o. female who presents for the following: Skin Cancer Screening and Full Body Skin Exam The patient presents for Total-Body Skin Exam (TBSE) for skin cancer screening and mole check. The patient has spots, moles and lesions to be evaluated, some may be new or changing and the patient has concerns that these could be cancer.  The following portions of the chart were reviewed this encounter and updated as appropriate: medications, allergies, medical history  Review of Systems:  No other skin or systemic complaints except as noted in HPI or Assessment and Plan.  Objective  Well appearing patient in no apparent distress; mood and affect are within normal limits.  A full examination was performed including scalp, head, eyes, ears, nose, lips, neck, chest, axillae, abdomen, back, buttocks, bilateral upper extremities, bilateral lower extremities, hands, feet, fingers, toes, fingernails, and toenails. All findings within normal limits unless otherwise noted below.   Relevant physical exam findings are noted in the Assessment and Plan.   Assessment & Plan   INFLAMED SEBORRHEIC KERATOSIS Exam: Erythematous keratotic or waxy stuck-on papule or plaque.  Symptomatic, irritating, patient would like treated.  Benign-appearing.  Call clinic for new or changing lesions.   Prior to procedure, discussed risks of blister formation, small wound, skin dyspigmentation, or rare scar following treatment. Recommend Vaseline ointment to treated areas while healing.  Destruction Procedure Note Destruction method: cryotherapy   Informed consent: discussed and consent obtained   Lesion destroyed using liquid nitrogen: Yes   Outcome: patient tolerated procedure well with no complications   Post-procedure details: wound care instructions given   Locations: left cheek x1 # of Lesions Treated: 1  LENTIGINES, SEBORRHEIC KERATOSES at left scalp   HEMANGIOMAS - Benign normal skin lesions - Benign-appearing - Call for any changes  MELANOCYTIC NEVI - Tan-brown and/or pink-flesh-colored symmetric macules and papules - Benign appearing on exam today - Observation - Call clinic for new or changing moles - Recommend daily use of broad spectrum spf 30+ sunscreen to sun-exposed areas.   ACTINIC DAMAGE - Chronic condition, secondary to cumulative UV/sun exposure - diffuse scaly erythematous macules with underlying dyspigmentation - Recommend daily broad spectrum sunscreen SPF 30+ to sun-exposed areas, reapply every 2 hours as needed.  - Staying in the shade or wearing long sleeves, sun glasses (UVA+UVB protection) and wide brim hats (4-inch brim around the entire circumference of the hat) are also recommended for sun protection.  - Call for new or changing lesions.  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  - Call if any new or changing lesions are noted between office visits  HISTORY OF BASAL CELL CARCINOMA OF THE SKIN Right proximal nasal ala 2019 - No evidence of recurrence today - Recommend regular full body skin exams - Recommend daily broad spectrum sunscreen SPF 30+ to sun-exposed areas, reapply every 2 hours as needed.  - Call if any new or changing lesions are noted between office visits  SKIN CANCER SCREENING PERFORMED TODAY.  Return in about 1 year (around 08/17/2023) for TBSE.  IRuthell Rummage, CMA, am acting as scribe for Sarina Ser, MD.  Documentation: I have reviewed the above documentation for accuracy and completeness, and I agree with the above.  Sarina Ser, MD

## 2022-08-17 NOTE — Patient Instructions (Addendum)
Seborrheic Keratosis  What causes seborrheic keratoses? Seborrheic keratoses are harmless, common skin growths that first appear during adult life.  As time goes by, more growths appear.  Some people may develop a large number of them.  Seborrheic keratoses appear on both covered and uncovered body parts.  They are not caused by sunlight.  The tendency to develop seborrheic keratoses can be inherited.  They vary in color from skin-colored to gray, brown, or even black.  They can be either smooth or have a rough, warty surface.   Seborrheic keratoses are superficial and look as if they were stuck on the skin.  Under the microscope this type of keratosis looks like layers upon layers of skin.  That is why at times the top layer may seem to fall off, but the rest of the growth remains and re-grows.    Treatment Seborrheic keratoses do not need to be treated, but can easily be removed in the office.  Seborrheic keratoses often cause symptoms when they rub on clothing or jewelry.  Lesions can be in the way of shaving.  If they become inflamed, they can cause itching, soreness, or burning.  Removal of a seborrheic keratosis can be accomplished by freezing, burning, or surgery. If any spot bleeds, scabs, or grows rapidly, please return to have it checked, as these can be an indication of a skin cancer.  Cryotherapy Aftercare  Wash gently with soap and water everyday.   Apply Vaseline and Band-Aid daily until healed.    Melanoma ABCDEs  Melanoma is the most dangerous type of skin cancer, and is the leading cause of death from skin disease.  You are more likely to develop melanoma if you: Have light-colored skin, light-colored eyes, or red or blond hair Spend a lot of time in the sun Tan regularly, either outdoors or in a tanning bed Have had blistering sunburns, especially during childhood Have a close family member who has had a melanoma Have atypical moles or large birthmarks  Early detection of  melanoma is key since treatment is typically straightforward and cure rates are extremely high if we catch it early.   The first sign of melanoma is often a change in a mole or a new dark spot.  The ABCDE system is a way of remembering the signs of melanoma.  A for asymmetry:  The two halves do not match. B for border:  The edges of the growth are irregular. C for color:  A mixture of colors are present instead of an even brown color. D for diameter:  Melanomas are usually (but not always) greater than 6mm - the size of a pencil eraser. E for evolution:  The spot keeps changing in size, shape, and color.  Please check your skin once per month between visits. You can use a small mirror in front and a large mirror behind you to keep an eye on the back side or your body.   If you see any new or changing lesions before your next follow-up, please call to schedule a visit.  Please continue daily skin protection including broad spectrum sunscreen SPF 30+ to sun-exposed areas, reapplying every 2 hours as needed when you're outdoors.   Staying in the shade or wearing long sleeves, sun glasses (UVA+UVB protection) and wide brim hats (4-inch brim around the entire circumference of the hat) are also recommended for sun protection.    Due to recent changes in healthcare laws, you may see results of your pathology and/or laboratory   studies on MyChart before the doctors have had a chance to review them. We understand that in some cases there may be results that are confusing or concerning to you. Please understand that not all results are received at the same time and often the doctors may need to interpret multiple results in order to provide you with the best plan of care or course of treatment. Therefore, we ask that you please give us 2 business days to thoroughly review all your results before contacting the office for clarification. Should we see a critical lab result, you will be contacted sooner.   If  You Need Anything After Your Visit  If you have any questions or concerns for your doctor, please call our main line at [phone] and press option 4 to reach your doctor's medical assistant. If no one answers, please leave a voicemail as directed and we will return your call as soon as possible. Messages left after 4 pm will be answered the following business day.   You may also send us a message via MyChart. We typically respond to MyChart messages within 1-2 business days.  For prescription refills, please ask your pharmacy to contact our office. Our fax number is [phone].  If you have an urgent issue when the clinic is closed that cannot wait until the next business day, you can page your doctor at the number below.    Please note that while we do our best to be available for urgent issues outside of office hours, we are not available 24/7.   If you have an urgent issue and are unable to reach us, you may choose to seek medical care at your doctor's office, retail clinic, urgent care center, or emergency room.  If you have a medical emergency, please immediately call 911 or go to the emergency department.  Pager Numbers  - Dr. Kowalski: [phone]  - Dr. Moye: [phone]  - Dr. Stewart: [phone]  In the event of inclement weather, please call our main line at [phone] for an update on the status of any delays or closures.  Dermatology Medication Tips: Please keep the boxes that topical medications come in in order to help keep track of the instructions about where and how to use these. Pharmacies typically print the medication instructions only on the boxes and not directly on the medication tubes.   If your medication is too expensive, please contact our office at [phone] option 4 or send us a message through MyChart.   We are unable to tell what your co-pay for medications will be in advance as this is different depending on your insurance coverage.  However, we may be able to find a substitute medication at lower cost or fill out paperwork to get insurance to cover a needed medication.   If a prior authorization is required to get your medication covered by your insurance company, please allow us 1-2 business days to complete this process.  Drug prices often vary depending on where the prescription is filled and some pharmacies may offer cheaper prices.  The website www.goodrx.com contains coupons for medications through different pharmacies. The prices here do not account for what the cost may be with help from insurance (it may be cheaper with your insurance), but the website can give you the price if you did not use any insurance.  - You can print the associated coupon and take it with your prescription to the pharmacy.  - You may also stop by our office during   regular business hours and pick up a GoodRx coupon card.  - If you need your prescription sent electronically to a different pharmacy, notify our office through [facility] MyChart or by phone at [phone] option 4.     Si Usted Necesita Algo Despus de Su Visita  Tambin puede enviarnos un mensaje a travs de MyChart. Por lo general respondemos a los mensajes de MyChart en el transcurso de 1 a 2 das hbiles.  Para renovar recetas, por favor pida a su farmacia que se ponga en contacto con nuestra oficina. Nuestro nmero de fax es el [phone].  Si tiene un asunto urgente cuando la clnica est cerrada y que no puede esperar hasta el siguiente da hbil, puede llamar/localizar a su doctor(a) al nmero que aparece a continuacin.   Por favor, tenga en cuenta que aunque hacemos todo lo posible para estar disponibles para asuntos urgentes fuera del horario de oficina, no estamos disponibles las 24 horas del da, los 7 das de la semana.   Si tiene un problema urgente y no puede comunicarse con nosotros, puede optar por buscar atencin mdica  en el consultorio de su  doctor(a), en una clnica privada, en un centro de atencin urgente o en una sala de emergencias.  Si tiene una emergencia mdica, por favor llame inmediatamente al 911 o vaya a la sala de emergencias.  Nmeros de bper  - Dr. Kowalski: [phone]  - Dra. Moye: [phone]  - Dra. Stewart: [phone]  En caso de inclemencias del tiempo, por favor llame a nuestra lnea principal al [phone] para una actualizacin sobre el estado de cualquier retraso o cierre.  Consejos para la medicacin en dermatologa: Por favor, guarde las cajas en las que vienen los medicamentos de uso tpico para ayudarle a seguir las instrucciones sobre dnde y cmo usarlos. Las farmacias generalmente imprimen las instrucciones del medicamento slo en las cajas y no directamente en los tubos del medicamento.   Si su medicamento es muy caro, por favor, pngase en contacto con nuestra oficina llamando al [phone] y presione la opcin 4 o envenos un mensaje a travs de MyChart.   No podemos decirle cul ser su copago por los medicamentos por adelantado ya que esto es diferente dependiendo de la cobertura de su seguro. Sin embargo, es posible que podamos encontrar un medicamento sustituto a menor costo o llenar un formulario para que el seguro cubra el medicamento que se considera necesario.   Si se requiere una autorizacin previa para que su compaa de seguros cubra su medicamento, por favor permtanos de 1 a 2 das hbiles para completar este proceso.  Los precios de los medicamentos varan con frecuencia dependiendo del lugar de dnde se surte la receta y alguna farmacias pueden ofrecer precios ms baratos.  El sitio web www.goodrx.com tiene cupones para medicamentos de diferentes farmacias. Los precios aqu no tienen en cuenta lo que podra costar con la ayuda del seguro (puede ser ms barato con su seguro), pero el sitio web puede darle el precio si no utiliz ningn seguro.  - Puede imprimir el cupn  correspondiente y llevarlo con su receta a la farmacia.  - Tambin puede pasar por nuestra oficina durante el horario de atencin regular y recoger una tarjeta de cupones de GoodRx.  - Si necesita que su receta se enve electrnicamente a una farmacia diferente, informe a nuestra oficina a travs de MyChart de [place] o por telfono llamando al [phone] y presione la opcin 4.   

## 2022-08-18 ENCOUNTER — Telehealth: Payer: Self-pay

## 2022-08-18 NOTE — Telephone Encounter (Signed)
-----   Message from Einar Pheasant, MD sent at 08/17/2022 11:18 PM EDT ----- Please call and notify - CXR is ok.  Lungs are clear.  Confirm doing ok.

## 2022-08-18 NOTE — Telephone Encounter (Signed)
LMTCB

## 2022-08-19 ENCOUNTER — Other Ambulatory Visit: Payer: Medicare Other

## 2022-08-23 NOTE — Telephone Encounter (Signed)
Noted  

## 2022-08-23 NOTE — Telephone Encounter (Signed)
Patient returned office phone call and note was read. Patient states everything is fine.

## 2022-08-24 ENCOUNTER — Ambulatory Visit: Payer: Medicare Other | Admitting: Internal Medicine

## 2022-08-24 ENCOUNTER — Encounter: Payer: Self-pay | Admitting: Internal Medicine

## 2022-08-24 ENCOUNTER — Ambulatory Visit (INDEPENDENT_AMBULATORY_CARE_PROVIDER_SITE_OTHER): Payer: Medicare Other | Admitting: Internal Medicine

## 2022-08-24 VITALS — BP 118/70 | HR 60 | Temp 98.2°F | Resp 16 | Ht 66.0 in | Wt 207.0 lb

## 2022-08-24 DIAGNOSIS — I10 Essential (primary) hypertension: Secondary | ICD-10-CM | POA: Diagnosis not present

## 2022-08-24 DIAGNOSIS — R739 Hyperglycemia, unspecified: Secondary | ICD-10-CM

## 2022-08-24 DIAGNOSIS — T466X5A Adverse effect of antihyperlipidemic and antiarteriosclerotic drugs, initial encounter: Secondary | ICD-10-CM | POA: Diagnosis not present

## 2022-08-24 DIAGNOSIS — G720 Drug-induced myopathy: Secondary | ICD-10-CM

## 2022-08-24 DIAGNOSIS — Z713 Dietary counseling and surveillance: Secondary | ICD-10-CM | POA: Diagnosis not present

## 2022-08-24 DIAGNOSIS — F320 Major depressive disorder, single episode, mild: Secondary | ICD-10-CM

## 2022-08-24 DIAGNOSIS — E785 Hyperlipidemia, unspecified: Secondary | ICD-10-CM | POA: Diagnosis not present

## 2022-08-24 MED ORDER — REPATHA SURECLICK 140 MG/ML ~~LOC~~ SOAJ: 140.0000 mg | SUBCUTANEOUS | 3 refills | Status: DC

## 2022-08-24 NOTE — Assessment & Plan Note (Addendum)
The 10-year ASCVD risk score (Arnett DK, et al., 2019) is: 18.6%   Values used to calculate the score:     Age: 77 years     Sex: Female     Is Non-Hispanic African American: No     Diabetic: No     Tobacco smoker: No     Systolic Blood Pressure: 118 mmHg     Is BP treated: Yes     HDL Cholesterol: 85.1 mg/dL     Total Cholesterol: 244 mg/dL  Have discussed calculated cholesterol risk. Had muscle aches with statin medication.  Taking Zetia. Working on diet and exercise.  Losing weight.  Given cholesterol not at goal, discussed repatha.  Agreeable.  Stop zetia.  Start repatha.  Follow lipid panel.

## 2022-08-24 NOTE — Progress Notes (Signed)
Subjective:    Patient ID: Sherry Davila, female    DOB: 23-Jun-1945, 77 y.o.   MRN: [id number]  Patient here for  Chief Complaint  Patient presents with   Medical Management of Chronic Issues    HPI Here to follow up regarding weight loss.  Was started on zepbound.  Tolerating.  Discussed medication and possible side effects.  No swallowing issues.  No increased nausea.  Has lost weight.  Discussed continued diet and exercise.  She desires to increase dose.  No chest pain or sob reported.  Handling stress.  No cough or congestion. Cleared.  Overall she feels she is doing well. Discussed labs.  Discussed cholesterol.  Had muscle aches with statin medication.  On zetia.  LDL not to goal.  Discussed repatha.  Agreeable to start.     Past Medical History:  Diagnosis Date   Actinic keratosis 04/04/2018   L nose lat to supra tip   Anxiety    Basal cell carcinoma 04/04/2018   R prox nasal ala   Depression    Dysplastic nevus 05/07/2013   R abdomen - mod to severe, excision 05/29/2013   Hyperlipidemia    Hypertension    Obesity    Sleep apnea    has a cpap, doesn't wear    Varicose vein    Past Surgical History:  Procedure Laterality Date   COLONOSCOPY WITH PROPOFOL N/A 09/08/2016   Procedure: COLONOSCOPY WITH PROPOFOL;  Surgeon: Wyline Mood, MD;  Location: [facility] ENDOSCOPY;  Service: Endoscopy;  Laterality: N/A;   COLONOSCOPY WITH PROPOFOL N/A 01/07/2020   Procedure: COLONOSCOPY WITH PROPOFOL;  Surgeon: Wyline Mood, MD;  Location: [facility] ENDOSCOPY;  Service: Gastroenterology;  Laterality: N/A;   FRACTURE SURGERY     foot, screws placed   JOINT REPLACEMENT Right 2009   knee   Family History  Problem Relation Age of Onset   Cancer Mother    Heart failure Mother    Heart disease Mother    Breast cancer Mother 9   Heart attack Mother    Obesity Father    Depression Father    Early death Father    Hyperlipidemia Father    AAA (abdominal aortic aneurysm) Father     Hypertension Father    Heart disease Father    Hypertension Sister    Social History   Socioeconomic History   Marital status: Married    Spouse name: Not on file   Number of children: Not on file   Years of education: Not on file   Highest education level: 12th grade  Occupational History   Not on file  Tobacco Use   Smoking status: Former    Types: Cigarettes    Quit date: 01/19/1988    Years since quitting: 34.6   Smokeless tobacco: Never  Vaping Use   Vaping Use: Never used  Substance and Sexual Activity   Alcohol use: Yes    Alcohol/week: 3.0 standard drinks of alcohol    Types: 3 Shots of liquor per week    Comment: 3 drinks a week socially    Drug use: No   Sexual activity: Not on file  Other Topics Concern   Not on file  Social History Narrative   Not on file   Social Determinants of Health   Financial Resource Strain: Low Risk  (08/23/2022)   Overall Financial Resource Strain (CARDIA)    Difficulty of Paying Living Expenses: Not hard at all  Food Insecurity: No Food Insecurity (08/23/2022)  Hunger Vital Sign    Worried About Running Out of Food in the Last Year: Never true    Ran Out of Food in the Last Year: Never true  Transportation Needs: No Transportation Needs (08/23/2022)   PRAPARE - Administrator, Civil Service (Medical): No    Lack of Transportation (Non-Medical): No  Physical Activity: Insufficiently Active (08/23/2022)   Exercise Vital Sign    Days of Exercise per Week: 3 days    Minutes of Exercise per Session: 20 min  Stress: No Stress Concern Present (08/23/2022)   [employer] of Occupational Health - Occupational Stress Questionnaire    Feeling of Stress : Not at all  Social Connections: Socially Integrated (08/23/2022)   Social Connection and Isolation Panel [NHANES]    Frequency of Communication with Friends and Family: Twice a week    Frequency of Social Gatherings with Friends and Family: Once a week    Attends Religious  Services: 1 to 4 times per year    Active Member of [employer] or Organizations: Yes    Attends Engineer, structural: More than 4 times per year    Marital Status: Married     Review of Systems  Constitutional:  Negative for appetite change and unexpected weight change.  HENT:  Negative for congestion and sinus pressure.   Respiratory:  Negative for cough, chest tightness and shortness of breath.   Cardiovascular:  Negative for chest pain and palpitations.  Gastrointestinal:  Negative for abdominal pain, diarrhea, nausea and vomiting.  Genitourinary:  Negative for difficulty urinating and dysuria.  Musculoskeletal:  Negative for joint swelling and myalgias.  Skin:  Negative for color change and rash.  Neurological:  Negative for dizziness and headaches.  Psychiatric/Behavioral:  Negative for agitation and dysphoric mood.        Objective:     BP 118/70   Pulse 60   Temp 98.2 F (36.8 C)   Resp 16   Ht 5\' 6"  (1.676 m)   Wt 207 lb (93.9 kg)   SpO2 98%   BMI 33.41 kg/m  Wt Readings from Last 3 Encounters:  08/24/22 207 lb (93.9 kg)  07/28/22 212 lb 12.8 oz (96.5 kg)  07/18/22 215 lb 6.4 oz (97.7 kg)    Physical Exam Vitals reviewed.  Constitutional:      General: She is not in acute distress.    Appearance: Normal appearance.  HENT:     Head: Normocephalic and atraumatic.     Right Ear: External ear normal.     Left Ear: External ear normal.  Eyes:     General: No scleral icterus.       Right eye: No discharge.        Left eye: No discharge.     Conjunctiva/sclera: Conjunctivae normal.  Neck:     Thyroid: No thyromegaly.  Cardiovascular:     Rate and Rhythm: Normal rate and regular rhythm.  Pulmonary:     Effort: No respiratory distress.     Breath sounds: Normal breath sounds. No wheezing.  Abdominal:     General: Bowel sounds are normal.     Palpations: Abdomen is soft.     Tenderness: There is no abdominal tenderness.  Musculoskeletal:         General: No swelling or tenderness.     Cervical back: Neck supple. No tenderness.  Lymphadenopathy:     Cervical: No cervical adenopathy.  Skin:    Findings: No erythema or rash.  Neurological:     Mental Status: She is alert.  Psychiatric:        Mood and Affect: Mood normal.        Behavior: Behavior normal.      Outpatient Encounter Medications as of 08/24/2022  Medication Sig   Evolocumab (REPATHA SURECLICK) 140 MG/ML SOAJ Inject 140 mg into the skin every 14 (fourteen) days.   amLODipine (NORVASC) 10 MG tablet Take 1 tablet (10 mg total) by mouth daily.   atenolol (TENORMIN) 25 MG tablet Take 1 tablet (25 mg total) by mouth daily.   FLUoxetine (PROZAC) 40 MG capsule Take 1 capsule (40 mg total) by mouth daily.   losartan-hydrochlorothiazide (HYZAAR) 100-12.5 MG tablet Take 1 tablet by mouth daily.   tirzepatide (ZEPBOUND) 5 MG/0.5ML Pen Inject 5 mg into the skin once a week.   [DISCONTINUED] ezetimibe (ZETIA) 10 MG tablet Take 1 tablet (10 mg total) by mouth daily.   [DISCONTINUED] predniSONE (DELTASONE) 10 MG tablet Take 4 tablets x 1 day and then decrease by 1/2 tablet per day until down to zero mg.   [DISCONTINUED] Vitamin D, Ergocalciferol, (DRISDOL) 1.25 MG (50000 UNIT) CAPS capsule Take 1 capsule (50,000 Units total) by mouth every 7 (seven) days.   No facility-administered encounter medications on file as of 08/24/2022.     Lab Results  Component Value Date   WBC 5.9 08/16/2021   HGB 13.9 08/16/2021   HCT 41.6 08/16/2021   PLT 258.0 08/16/2021   GLUCOSE 88 08/15/2022   CHOL 244 (H) 08/15/2022   TRIG 79.0 08/15/2022   HDL 85.10 08/15/2022   LDLCALC 143 (H) 08/15/2022   ALT 17 08/15/2022   AST 17 08/15/2022   NA 137 08/15/2022   K 4.4 08/15/2022   CL 100 08/15/2022   CREATININE 0.72 08/15/2022   BUN 17 08/15/2022   CO2 28 08/15/2022   TSH 1.990 12/23/2021   HGBA1C 5.4 08/15/2022    DG Chest 2 View  Result Date: 04/25/2022 CLINICAL DATA:  Fall, pain,  bilateral rib pain EXAM: CHEST - 2 VIEW COMPARISON:  None Available. FINDINGS: Mild cardiomegaly. Both lungs are clear. Disc degenerative disease of the thoracic spine. IMPRESSION: 1.  Mild cardiomegaly without acute abnormality of the lungs. 2.  No obvious displaced rib fractures on chest radiographs. Electronically Signed   By: Jearld Lesch M.D.   On: 04/25/2022 11:44       Assessment & Plan:  Hyperlipidemia, unspecified hyperlipidemia type Assessment & Plan: The 10-year ASCVD risk score (Arnett DK, et al., 2019) is: 18.6%   Values used to calculate the score:     Age: 49 years     Sex: Female     Is Non-Hispanic African American: No     Diabetic: No     Tobacco smoker: No     Systolic Blood Pressure: 118 mmHg     Is BP treated: Yes     HDL Cholesterol: 85.1 mg/dL     Total Cholesterol: 244 mg/dL  Have discussed calculated cholesterol risk. Had muscle aches with statin medication.  Taking Zetia. Working on diet and exercise.  Losing weight.  Given cholesterol not at goal, discussed repatha.  Agreeable.  Stop zetia.  Start repatha.  Follow lipid panel.   Orders: -     Lipid panel; Future -     Hepatic function panel; Future  Depression, major, single episode, mild Assessment & Plan: Continues on prozac.  Follow. Overall appears to be doing better. Feels better.  Hyperglycemia Assessment & Plan: Low carb diet and exercise. Follow met b and a1c.  Lab Results  Component Value Date   HGBA1C 5.4 08/15/2022    Orders: -     Hemoglobin A1c; Future  Hypertension, unspecified type Assessment & Plan: Continue losartan/hctz, amlodipine and atenolol.  Blood pressure better.  Follow pressures.  Follow metabolic panel.    Orders: -     Basic metabolic panel; Future  Statin myopathy Assessment & Plan: Muscle aches with statin.  Discussed repatha.     Weight loss counseling, encounter for Assessment & Plan: On zepbound.  Tolerating.  Discussed continuing diet and exercise.   Increase zepbound to  q day.     Other orders -     Repatha SureClick; Inject 140 mg into the skin every 14 (fourteen) days.  Dispense: 2 mL; Refill: 3     Dale [place], MD

## 2022-08-25 ENCOUNTER — Other Ambulatory Visit: Payer: Medicare Other

## 2022-08-27 ENCOUNTER — Encounter: Payer: Self-pay | Admitting: Internal Medicine

## 2022-08-27 DIAGNOSIS — Z713 Dietary counseling and surveillance: Secondary | ICD-10-CM | POA: Insufficient documentation

## 2022-08-27 NOTE — Assessment & Plan Note (Signed)
On zepbound.  Tolerating.  Discussed continuing diet and exercise.  Increase zepbound to 5mg  q day.

## 2022-08-27 NOTE — Assessment & Plan Note (Signed)
Continues on prozac.  Follow. Overall appears to be doing better. Feels better.  

## 2022-08-27 NOTE — Assessment & Plan Note (Signed)
Continue losartan/hctz, amlodipine and atenolol.  Blood pressure better.  Follow pressures.  Follow metabolic panel.   

## 2022-08-27 NOTE — Assessment & Plan Note (Signed)
Muscle aches with statin.  Discussed repatha.

## 2022-08-27 NOTE — Assessment & Plan Note (Signed)
Low carb diet and exercise. Follow met b and a1c.  Lab Results  Component Value Date   HGBA1C 5.4 08/15/2022

## 2022-08-30 ENCOUNTER — Ambulatory Visit: Payer: Medicare Other | Admitting: Internal Medicine

## 2022-09-05 ENCOUNTER — Ambulatory Visit (INDEPENDENT_AMBULATORY_CARE_PROVIDER_SITE_OTHER): Payer: Medicare Other | Admitting: Family Medicine

## 2022-09-12 ENCOUNTER — Ambulatory Visit (INDEPENDENT_AMBULATORY_CARE_PROVIDER_SITE_OTHER): Payer: Medicare Other | Admitting: Family Medicine

## 2022-09-12 ENCOUNTER — Encounter (INDEPENDENT_AMBULATORY_CARE_PROVIDER_SITE_OTHER): Payer: Self-pay | Admitting: Family Medicine

## 2022-09-12 ENCOUNTER — Encounter (INDEPENDENT_AMBULATORY_CARE_PROVIDER_SITE_OTHER): Payer: Self-pay

## 2022-09-12 VITALS — BP 130/77 | HR 59 | Temp 97.7°F | Ht 66.0 in | Wt 203.0 lb

## 2022-09-12 DIAGNOSIS — E559 Vitamin D deficiency, unspecified: Secondary | ICD-10-CM | POA: Diagnosis not present

## 2022-09-12 DIAGNOSIS — I10 Essential (primary) hypertension: Secondary | ICD-10-CM | POA: Diagnosis not present

## 2022-09-12 DIAGNOSIS — M549 Dorsalgia, unspecified: Secondary | ICD-10-CM | POA: Diagnosis not present

## 2022-09-12 DIAGNOSIS — E669 Obesity, unspecified: Secondary | ICD-10-CM

## 2022-09-12 DIAGNOSIS — Z6832 Body mass index (BMI) 32.0-32.9, adult: Secondary | ICD-10-CM | POA: Diagnosis not present

## 2022-09-12 DIAGNOSIS — G8929 Other chronic pain: Secondary | ICD-10-CM

## 2022-09-12 MED ORDER — ZEPBOUND 7.5 MG/0.5ML ~~LOC~~ SOAJ: 7.5000 mg | SUBCUTANEOUS | 0 refills | Status: DC

## 2022-09-12 NOTE — Assessment & Plan Note (Signed)
Last vitamin D Lab Results  Component Value Date   VD25OH 24.2 (L) 12/23/2021   She has been taking RX vitamin D 50,000 IU weekly.  Her energy level is improving.  She reports having Vitamin D level rechecked and will bring in a copy of her labs next visit.  Target level 50-70.

## 2022-09-12 NOTE — Progress Notes (Signed)
Office: [phone]  /  Fax: [phone]  WEIGHT SUMMARY AND BIOMETRICS  Starting Date: 12/23/21  Starting Weight: 224   Weight Lost Since Last Visit: 19lb   Vitals Temp: 97.7 F (36.5 C) BP: 130/77 Pulse Rate: (!) 59 SpO2: 100 %   Body Composition  Body Fat %: 45.9 % Fat Mass (lbs): 93.6 lbs Muscle Mass (lbs): 104.6 lbs Total Body Water (lbs): 75.8 lbs Visceral Fat Rating : 14     HPI  Chief Complaint: OBESITY  Sherry Davila is here to discuss her progress with her obesity treatment plan. She is on the keeping a food journal and adhering to recommended goals of 1500 calories and 85 protein and states she is following her eating plan approximately 75 % of the time. She states she is walking 1 mile.   Interval History:  Since last office visit she is is down 19 lb in the past 3 mos She has been traveling a lot She is walking more She has some free weights Her back pain has improved some with weight loss and her energy level has improved She is tracking her calories She started Zepbound in March.  Currently on 5 mg weekly, paying out of pocket Enjoying added satiety without adverse side effects.  Pharmacotherapy: Zepbound 5 mg weekly  PHYSICAL EXAM:  Blood pressure 130/77, pulse (!) 59, temperature 97.7 F (36.5 C), height  (1.676 m), weight 203 lb (92.1 kg), SpO2 100 %. Body mass index is 32.77 kg/m.  General: She is overweight, cooperative, alert, well developed, and in no acute distress. PSYCH: Has normal mood, affect and thought process.   Lungs: Normal breathing effort, no conversational dyspnea.   ASSESSMENT AND PLAN  TREATMENT PLAN FOR OBESITY:  Recommended Dietary Goals  Sherry Davila is currently in the action stage of change. As such, her goal is to continue weight management plan. She has agreed to keeping a food journal and adhering to recommended goals of 1200 calories and 80g of  protein.  Behavioral Intervention  We discussed the following  Behavioral Modification Strategies today: increasing lean protein intake, decreasing simple carbohydrates , increasing vegetables, increasing lower glycemic fruits, increasing fiber rich foods, increasing water intake, reading food labels , continue to work on implementation of reduced calorie nutritional plan, continue to practice mindfulness when eating, planning for success, and keeping healthy foods at home.  Additional resources provided today: NA  Recommended Physical Activity Goals  Sherry Davila has been advised to work up to 150 minutes of moderate intensity aerobic activity a week and strengthening exercises 2-3 times per week for cardiovascular health, weight loss maintenance and preservation of muscle mass.   She has agreed to Think about ways to increase physical activity  Pharmacotherapy changes for the treatment of obesity: increase Zepbound to 7.5 mg weekly  ASSOCIATED CONDITIONS ADDRESSED TODAY  Hypertension, unspecified type Assessment & Plan: BP is well control today on amlodipine 10 mg daily + atenolol 25 mg daily + Hyzaar 100-125 mg daily.    She denies adverse side effects and is happy to see BP improvements with healthy lifestyle changes and weight loss.  Continue current medications.  Avoid high sodium foods.    Generalized obesity -     Zepbound; Inject 7.5 mg into the skin once a week.  Dispense: 2 mL; Refill: 0  BMI 32.0-32.9,adult  Chronic back pain, unspecified back location, unspecified back pain laterality  Vitamin D deficiency Assessment & Plan: Last vitamin D Lab Results  Component Value Date  VD25OH 24.2 (L) 12/23/2021   She has been taking RX vitamin D 50,000 IU weekly.  Her energy level is improving.  She reports having Vitamin D level rechecked and will bring in a copy of her labs next visit.  Target level 50-70.          She was informed of the importance of frequent follow up visits to maximize her success with intensive lifestyle  modifications for her multiple health conditions.   ATTESTASTION STATEMENTS:  Reviewed by clinician on day of visit: allergies, medications, problem list, medical history, surgical history, family history, social history, and previous encounter notes pertinent to obesity diagnosis.   I have personally spent 30 minutes total time today in preparation, patient care, nutritional counseling and documentation for this visit, including the following: review of clinical lab tests; review of medical tests/procedures/services.      Glennis Brink, DO DABFM, DABOM [facility] [address] [place], [place] [address] [phone]

## 2022-09-12 NOTE — Assessment & Plan Note (Signed)
Chronic back pain is starting to improve with weight loss and she has been able to walk more.    We talked about additional ways to get in physical activity.  Continue active plan for weight reduction.

## 2022-09-12 NOTE — Assessment & Plan Note (Signed)
BP is well control today on amlodipine 10 mg daily + atenolol 25 mg daily + Hyzaar 100-125 mg daily.    She denies adverse side effects and is happy to see BP improvements with healthy lifestyle changes and weight loss.  Continue current medications.  Avoid high sodium foods.

## 2022-09-15 NOTE — Telephone Encounter (Signed)
Faxed over the prior authorization form to [facility] per patients insurance. Waiting for determination.

## 2022-09-18 DIAGNOSIS — H109 Unspecified conjunctivitis: Secondary | ICD-10-CM | POA: Diagnosis not present

## 2022-09-19 ENCOUNTER — Encounter: Payer: Self-pay | Admitting: Family

## 2022-09-19 ENCOUNTER — Ambulatory Visit (INDEPENDENT_AMBULATORY_CARE_PROVIDER_SITE_OTHER): Payer: Medicare Other | Admitting: Family

## 2022-09-19 VITALS — BP 130/70 | HR 67 | Temp 98.2°F | Ht 66.0 in | Wt 204.2 lb

## 2022-09-19 DIAGNOSIS — H1013 Acute atopic conjunctivitis, bilateral: Secondary | ICD-10-CM

## 2022-09-19 DIAGNOSIS — J029 Acute pharyngitis, unspecified: Secondary | ICD-10-CM | POA: Diagnosis not present

## 2022-09-19 DIAGNOSIS — H1010 Acute atopic conjunctivitis, unspecified eye: Secondary | ICD-10-CM | POA: Insufficient documentation

## 2022-09-19 LAB — POCT RAPID STREP A (OFFICE): Rapid Strep A Screen: NEGATIVE

## 2022-09-19 MED ORDER — LORATADINE 10 MG PO TABS: 10.0000 mg | ORAL_TABLET | Freq: Two times a day (BID) | ORAL | 0 refills | Status: DC

## 2022-09-19 NOTE — Progress Notes (Unsigned)
Assessment & Plan:  There are no diagnoses linked to this encounter.   Return precautions given.   Risks, benefits, and alternatives of the medications and treatment plan prescribed today were discussed, and patient expressed understanding.   Education regarding symptom management and diagnosis given to patient on AVS either electronically or printed.  No follow-ups on file.  Rennie Plowman, FNP  Subjective:    Patient ID: Sherry Davila, female    DOB: 1946-02-02, 77 y.o.   MRN: [id number]  CC: [place] is a 77 y.o. female who presents today for an acute visit.    HPI: Complains bilateral eye redness x one day, somewhat better today  Initially worse right eye  Crusty in eyelashes and eyes 'stuck shut'.  Eyes are itchy.   Describes gritty sensation.    Yellow to clear discharge from both eyes  No contacts lenses, photophobia  Endorses PND.   No fever , chills, foreign body sensation,   No fume exposure, h/o seasonal allergies     Seen at [facility] and started polytrim yesterday    Allergies: Patient has no known allergies. Current Outpatient Medications on File Prior to Visit  Medication Sig Dispense Refill   amLODipine (NORVASC) 10 MG tablet Take 1 tablet (10 mg total) by mouth daily. 90 tablet 1   atenolol (TENORMIN) 25 MG tablet Take 1 tablet (25 mg total) by mouth daily. 90 tablet 3   Evolocumab (REPATHA SURECLICK) 140 MG/ML SOAJ Inject 140 mg into the skin every 14 (fourteen) days. 2 mL 3   FLUoxetine (PROZAC) 40 MG capsule Take 1 capsule (40 mg total) by mouth daily. 90 capsule 1   losartan-hydrochlorothiazide (HYZAAR) 100-12.5 MG tablet Take 1 tablet by mouth daily. 90 tablet 1   tirzepatide (ZEPBOUND) 5 MG/0.5ML Pen Inject 5 mg into the skin once a week. 6 mL 1   tirzepatide (ZEPBOUND) 7.5 MG/0.5ML Pen Inject 7.5 mg into the skin once a week. 2 mL 0   No current facility-administered medications on file prior to visit.    Review of  Systems    Objective:    BP 130/70   Pulse 67   Temp 98.2 F (36.8 C) (Oral)   Ht 5\' 6"  (1.676 m)   Wt 204 lb 3.2 oz (92.6 kg)   SpO2 97%   BMI 32.96 kg/m   BP Readings from Last 3 Encounters:  09/19/22 130/70  09/12/22 130/77  08/24/22 118/70   Wt Readings from Last 3 Encounters:  09/19/22 204 lb 3.2 oz (92.6 kg)  09/12/22 203 lb (92.1 kg)  08/24/22 207 lb (93.9 kg)    Physical Exam Vitals reviewed.  Constitutional:      Appearance: She is well-developed.  HENT:     Head: Normocephalic and atraumatic.     Right Ear: Hearing, tympanic membrane, ear canal and external ear normal. No decreased hearing noted. No drainage, swelling or tenderness. No middle ear effusion. No foreign body. Tympanic membrane is not erythematous or bulging.     Left Ear: Hearing, tympanic membrane, ear canal and external ear normal. No decreased hearing noted. No drainage, swelling or tenderness.  No middle ear effusion. No foreign body. Tympanic membrane is not erythematous or bulging.     Nose: No rhinorrhea.     Right Sinus: No maxillary sinus tenderness or frontal sinus tenderness.     Left Sinus: No maxillary sinus tenderness or frontal sinus tenderness.     Mouth/Throat:     Pharynx: Uvula midline.  No oropharyngeal exudate or posterior oropharyngeal erythema.     Tonsils: No tonsillar abscesses.  Eyes:     General: Lids are everted, no foreign bodies appreciated. No scleral icterus.       Right eye: No discharge.        Left eye: No discharge or hordeolum.     Conjunctiva/sclera:     Right eye: Right conjunctiva is not injected. No hemorrhage.    Left eye: Left conjunctiva is not injected. No hemorrhage.    Pupils: Pupils are equal, round, and reactive to light.     Comments: No external eye lesions. Surrounding skin intact.   Bilateral eye:   Diffuse injection of the conjunctiva. No white spots, opacity, or foreign body appreciated. No collection of blood or pus in the anterior  chamber. No ciliary flush surrounding iris.   No photophobia or eye pain appreciated during exam.   Cardiovascular:     Rate and Rhythm: Regular rhythm.     Pulses: Normal pulses.     Heart sounds: Normal heart sounds.  Pulmonary:     Effort: Pulmonary effort is normal.     Breath sounds: Normal breath sounds. No wheezing, rhonchi or rales.  Lymphadenopathy:     Head:     Right side of head: No submental, submandibular, tonsillar, preauricular, posterior auricular or occipital adenopathy.     Left side of head: No submental, submandibular, tonsillar, preauricular, posterior auricular or occipital adenopathy.     Cervical: No cervical adenopathy.  Skin:    General: Skin is warm and dry.  Neurological:     Mental Status: She is alert.  Psychiatric:        Speech: Speech normal.        Behavior: Behavior normal.        Thought Content: Thought content normal.

## 2022-09-20 NOTE — Patient Instructions (Signed)
Complete polytrim  Start claritin 10mg  twice daily  Cool compresses to eyes  Let me know how you are doing  Allergic Conjunctivitis, Adult Allergic conjunctivitis is inflammation of the conjunctiva. The conjunctiva is the thin, clear membrane that covers the white part of the eye and the inner surface of the eyelid. Allergies can affect this layer of the eye. In this condition: The blood vessels in the conjunctiva swell and become irritated. The eyes become red or pink and feel itchy. There is often a watery discharge from the eyes. Allergic conjunctivitis is not contagious. This means it cannot be spread from person to person. The condition can develop at any age and may be outgrown. What are the causes? This condition is caused by allergens. These are things that can cause an allergic reaction in some people. Common allergens include: Outdoor allergens, such as: Pollen, including pollen from grass and weeds. Mold spores. Car fumes. Pollution. Indoor allergens, such as: Dust. Smoke. Mold spores. Proteins in a pet's urine, saliva, or dander. Protein buildup on contact lenses. What increases the risk? You may be more likely to develop this condition if you have a family history of these things: Allergies. Conditions caused by being exposed to allergens, such as: Allergic rhinitis. This is an allergic reaction that affects the nose. Bronchial asthma. This condition affects the large airways in the lungs and makes breathing difficult. Atopic dermatitis (eczema). This is inflammation of the skin that is long-term (chronic). What are the signs or symptoms? Symptoms of this condition include eyes that are itchy, red, watery, or puffy. Your eyes may also: Sting or burn. Have clear fluid draining from them. Have thick mucus discharge and pain (vernal conjunctivitis). This happens in severe cases. How is this diagnosed? This condition may be diagnosed based on: Your medical  history. A physical exam, including an eye exam. Tests of the fluid draining from your eyes to rule out other causes. Other tests to confirm the diagnosis, including: Testing for allergies. The skin may be pricked with a tiny needle. The pricked area is then exposed to small amounts of allergens. Testing for other eye conditions. Tests may include: Blood tests. Tissue scrapings from your eyelid. The tissue is then checked under a microscope. How is this treated? Treatment for this condition may include: Using cold, wet cloths (cold compresses) to soothe itching and swelling. Washing your face and hair. Also, washing your clothes often to remove allergens. Using eye drops. These may be prescription or over-the-counter. You may need to try different types to see which one works best for you. Examples include: Eye drops that wash allergens out of the eyes (preservative-free artificial tears). Eye drops that block the allergic reaction (antihistamine). Eye drops that reduce swelling and irritation (anti-inflammatory). Steroid eye drops, which may be given if other treatments have not worked. Oral antihistamine medicines. These are medicines taken by mouth to lessen your allergic reaction. You may need these if eye drops do not help or are difficult to use. An air purifier at home and work. Wraparound sunglasses. This may help to decrease the amount of allergens reaching the eye. Not wearing contact lenses until symptoms improve, if the condition was caused by contact lenses. Change to daily wear disposable contact lenses, if possible. Follow these instructions at home: Eye care Apply a clean, cold compress to your eyes for 10-20 minutes, 3-4 times a day. Do not touch or rub your eyes. Do not wear contact lenses until the inflammation is gone. Wear  glasses instead. Do not wear eye makeup until the inflammation is gone. General instructions Avoid known allergens whenever possible. Take or  apply over-the-counter and prescription medicines only as told by your health care provider. These include any eye drops. Drink enough fluid to keep your urine pale yellow. Keep all follow-up visits. Contact a health care provider if: Your symptoms get worse or do not get better with treatment. You have mild eye pain. You become sensitive to light. You have spots or blisters on your eyes. You have a fever. Get help right away if: You have redness, swelling, or other symptoms in only one eye. Your vision is blurred or you have other vision changes. You have pus draining from your eyes. You have severe eye pain. Summary Allergic conjunctivitis is inflammation of the eye that is caused by allergens. It affects the clear membrane that covers the white part of the eye and the inner surface of the eyelid. It often causes eye itching, redness, and a watery discharge. Take or apply over-the-counter and prescription medicines only as told by your health care provider. These include eye drops. Do not touch or rub your eyes. Contact a health care provider if your symptoms get worse or do not get better with treatment. This information is not intended to replace advice given to you by your health care provider. Make sure you discuss any questions you have with your health care provider. Document Revised: 07/19/2021 Document Reviewed: 07/19/2021 Elsevier Patient Education  2023 [employer].

## 2022-09-20 NOTE — Assessment & Plan Note (Signed)
No alarm features at this time. C/w allergic versus bacterial conjunctivitis. I asked supervising Dr Darrick Huntsman to evaluate patient as well whom agreed with plan of care.  Complete polytrim as started by urgent care ; start claritin 10mg  BID due to systemic features. Counseled on alarm features which would warrant ophthalmology consult.

## 2022-09-29 ENCOUNTER — Telehealth: Payer: Self-pay

## 2022-09-29 ENCOUNTER — Telehealth: Payer: Self-pay | Admitting: Internal Medicine

## 2022-09-29 NOTE — Telephone Encounter (Signed)
Patient Advocate Encounter  Prior Authorization for Repatha SureClick 140MG /ML auto-injectors has been approved through [employer].  KeySammuel Hines    Effective: 09-23-2022 to until further notice

## 2022-09-29 NOTE — Telephone Encounter (Signed)
LM for patient

## 2022-09-29 NOTE — Telephone Encounter (Signed)
Copied from CRM [id number]. Topic: Medicare AWV >> Sep 29, 2022  2:15 PM Payton Doughty wrote: Reason for CRM: Called patient to schedule Medicare Annual Wellness Visit (AWV). Left message for patient to call back and schedule Medicare Annual Wellness Visit (AWV).  Last date of AWV: 10/11/21  Please schedule an appointment at any time with [facility].  If any questions, please contact me.  Thank you ,  Verlee Rossetti; Care Guide Ambulatory Clinical Support [facility] Direct Dial: [phone]

## 2022-09-30 ENCOUNTER — Telehealth: Payer: Self-pay

## 2022-09-30 NOTE — Telephone Encounter (Signed)
Prescription Request  09/30/2022  LOV: Visit date not found  What is the name of the medication or equipment? Evolocumab (REPATHA SURECLICK) 140 MG/ML SOAJ and tirzepatide (ZEPBOUND) 5 MG/0.5ML Pen    Have you contacted your pharmacy to request a refill?  Yes  Which pharmacy would you like this sent to?  [id number] [place], [place] - [address] [place] [address] Phone: [phone] Fax: [phone]   Patient notified that their request is being sent to the clinical staff for review and that they should receive a response within 2 business days.   Please advise at Mobile [phone] (mobile)  Patient returned our call and found out this medication has been approved by her insurance.  Patient states she would like for [place] to please send the prescription in for her. Patient states she has had trouble finding the Zepbound 5MG .  Patient states she would like to have a paper prescription from [place] so she can take it to whichever pharmacy she finds that has the medication.  Patient states she would like to come pick it up when ready.

## 2022-10-03 NOTE — Telephone Encounter (Signed)
Ok to remain on 5mg  dose.

## 2022-10-03 NOTE — Telephone Encounter (Signed)
LM for patient. Prescription for repatha was already sent in 08/2022. Need to confirm what dose of zepbound she is taking. She has 5.0 and 7.5 in her chart.

## 2022-10-03 NOTE — Telephone Encounter (Signed)
Pt called back and I read the message and she is taking the 5 for zepbound

## 2022-10-03 NOTE — Telephone Encounter (Signed)
[facility] in rx of zepbound 7.5 but patient says she is doing ok on the 5 mg and does not feel that she needs to increase yet. Ok to print rx for zepbound 5 mg so she can find somewhere that has it in stock to get it filled?

## 2022-10-04 ENCOUNTER — Other Ambulatory Visit: Payer: Self-pay

## 2022-10-04 MED ORDER — ZEPBOUND 5 MG/0.5ML ~~LOC~~ SOAJ: 5.0000 mg | SUBCUTANEOUS | 1 refills | Status: DC

## 2022-10-04 NOTE — Telephone Encounter (Signed)
Rx placed up front for pick up. Patient is aware.

## 2022-10-04 NOTE — Telephone Encounter (Signed)
Rx printed for signature. 

## 2022-10-18 ENCOUNTER — Ambulatory Visit (INDEPENDENT_AMBULATORY_CARE_PROVIDER_SITE_OTHER): Payer: Medicare Other | Admitting: Family Medicine

## 2022-10-18 ENCOUNTER — Encounter (INDEPENDENT_AMBULATORY_CARE_PROVIDER_SITE_OTHER): Payer: Self-pay | Admitting: Family Medicine

## 2022-10-18 VITALS — BP 136/81 | HR 59 | Temp 97.8°F | Ht 66.0 in | Wt 199.0 lb

## 2022-10-18 DIAGNOSIS — E559 Vitamin D deficiency, unspecified: Secondary | ICD-10-CM

## 2022-10-18 DIAGNOSIS — G47411 Narcolepsy with cataplexy: Secondary | ICD-10-CM

## 2022-10-18 DIAGNOSIS — Z6832 Body mass index (BMI) 32.0-32.9, adult: Secondary | ICD-10-CM

## 2022-10-18 MED ORDER — ZEPBOUND 7.5 MG/0.5ML ~~LOC~~ SOAJ: 7.5000 mg | SUBCUTANEOUS | 0 refills | Status: DC

## 2022-10-18 NOTE — Progress Notes (Signed)
Office: [phone]  /  Fax: [phone]  WEIGHT SUMMARY AND BIOMETRICS  Starting Date: 12/23/21  Starting Weight: 224lb   Weight Lost Since Last Visit: 4lb   Vitals Temp: 97.8 F (36.6 C) BP: 136/81 Pulse Rate: (!) 59 SpO2: 100 %   Body Composition  Body Fat %: 45.7 % Fat Mass (lbs): 91.2 lbs Muscle Mass (lbs): 102.8 lbs Total Body Water (lbs): 74.4 lbs Visceral Fat Rating : 14     HPI  Chief Complaint: OBESITY  Sherry Davila is here to discuss her progress with her obesity treatment plan. She is on the keeping a food journal and adhering to recommended goals of 1200 calories and 81 protein and states she is following her eating plan approximately 90 % of the time. She states she is exercising 0 minutes 0 times per week.   Interval History:  Since last office visit she is down 5 pounds She lost 1.8 pounds of muscle mass and lost 2.4 pounds of body fat in the past month She has a net weight loss of 25 pounds in the past 9 months of medically supervised weight management  this is an 11% total body weight loss. She has really seen benefit since starting on tirzepatide for obesity management.  She has improved appetite control without GI side effects.  She is paying out-of-pocket for this.  She is still on 5 mg weekly.  She has noticed a waning satiety over the past 2 weeks. She is logging her daily caloric intake.  There are times she is feeling a bit hungrier. Her back pain has been improving with weight loss and she is more active.  Pharmacotherapy: Tirzepatide 5 mg once weekly injection (Zepbound)  PHYSICAL EXAM:  Blood pressure 136/81, pulse (!) 59, temperature 97.8 F (36.6 C), height 5\' 6"  (1.676 m), weight 199 lb (90.3 kg), SpO2 100 %. Body mass index is 32.12 kg/m.  General: She is overweight, cooperative, alert, well developed, and in no acute distress. PSYCH: Has normal mood, affect and thought process.   Lungs: Normal breathing effort, no conversational  dyspnea.   ASSESSMENT AND PLAN  TREATMENT PLAN FOR OBESITY:  Recommended Dietary Goals  Sherry Davila is currently in the action stage of change. As such, her goal is to continue weight management plan. She has agreed to keeping a food journal and adhering to recommended goals of 1200 calories and 80 g of protein.  Behavioral Intervention  We discussed the following Behavioral Modification Strategies today: increasing lean protein intake, decreasing simple carbohydrates , increasing vegetables, increasing lower glycemic fruits, increasing fiber rich foods, avoiding skipping meals, increasing water intake, work on meal planning and preparation, keeping healthy foods at home, continue to practice mindfulness when eating, and planning for success.  Additional resources provided today: NA  Recommended Physical Activity Goals  Sherry Davila has been advised to work up to 150 minutes of moderate intensity aerobic activity a week and strengthening exercises 2-3 times per week for cardiovascular health, weight loss maintenance and preservation of muscle mass.   She has agreed to Start aerobic activity with a goal of 150 minutes a week at moderate intensity.   Pharmacotherapy changes for the treatment of obesity: Zepbound increased to 7.5 mg once weekly injection  ASSOCIATED CONDITIONS ADDRESSED TODAY  Transient total loss of muscle tone Assessment & Plan: Sherry Davila has lost approximately 7 pounds of muscle mass since starting our program of medically supervised weight management August 2023.  Her overall weight loss is 25 pounds in the past  9 months.  28% of her total body weight loss was in muscle mass.  She has been lacking resistance training but has increased her intake of dietary protein to at least 80 g/day.  We discussed the risk for muscle loss while on tirzepatide injections and the risk for reduced metabolic rate. Recommend water exercise, resistance bands or weight training exercise 3 days a week for  at least 20 minutes in addition to her cardio exercise. Continue tracking dietary protein intake with a target goal of at least 80 g/day.   Morbid obesity (HCC) with starting BMI 37 Assessment & Plan: Reviewed overall progress.  Patient is down 25 pounds in the past 9 months of medically supervised weight management.  She is seeing improvements in her back pain with weight reduction.  Her energy level has improved and she is motivated to continue working on regular exercise and healthy dietary changes.  She is seeing improvements in appetite control on Zepbound 5 mg weekly without adverse side effect.  Increase Zepbound to 7.5 mg once weekily injection while continuing to log dietary intake aiming for 1200 cal/day which should include at least 80 g of protein daily.  Add in resistance training exercise 2 days a week.  Orders: -     Zepbound; Inject 7.5 mg into the skin once a week.  Dispense: 2 mL; Refill: 0  Vitamin D deficiency Assessment & Plan: Last vitamin D Lab Results  Component Value Date   VD25OH 24.2 (L) 12/23/2021   She completed 12 weeks of vitamin D prescription 50,000 IU once weekly.  She reports having her vitamin D level rechecked by her PCP and was switched over to an over-the-counter vitamin D supplement.  She did not bring a copy of her labs in today.  Continue over the counter vitamin D once daily per PCP.   BMI 32.0-32.9,adult      She was informed of the importance of frequent follow up visits to maximize her success with intensive lifestyle modifications for her multiple health conditions.   ATTESTASTION STATEMENTS:  Reviewed by clinician on day of visit: allergies, medications, problem list, medical history, surgical history, family history, social history, and previous encounter notes pertinent to obesity diagnosis.   I have personally spent 30 minutes total time today in preparation, patient care, nutritional counseling and documentation for this  visit, including the following: review of clinical lab tests; review of medical tests/procedures/services.      Glennis Brink, DO DABFM, DABOM [facility] [address] [place], [place] [address] [phone]

## 2022-10-18 NOTE — Assessment & Plan Note (Signed)
Sherry Davila has lost approximately 7 pounds of muscle mass since starting our program of medically supervised weight management August 2023.  Her overall weight loss is 25 pounds in the past 9 months.  28% of her total body weight loss was in muscle mass.  She has been lacking resistance training but has increased her intake of dietary protein to at least 80 g/day.  We discussed the risk for muscle loss while on tirzepatide injections and the risk for reduced metabolic rate. Recommend water exercise, resistance bands or weight training exercise 3 days a week for at least 20 minutes in addition to her cardio exercise. Continue tracking dietary protein intake with a target goal of at least 80 g/day.

## 2022-10-18 NOTE — Assessment & Plan Note (Signed)
Last vitamin D Lab Results  Component Value Date   VD25OH 24.2 (L) 12/23/2021   She completed 12 weeks of vitamin D prescription 50,000 IU once weekly.  She reports having her vitamin D level rechecked by her PCP and was switched over to an over-the-counter vitamin D supplement.  She did not bring a copy of her labs in today.  Continue over the counter vitamin D once daily per PCP.

## 2022-10-18 NOTE — Assessment & Plan Note (Signed)
Reviewed overall progress.  Patient is down 25 pounds in the past 9 months of medically supervised weight management.  She is seeing improvements in her back pain with weight reduction.  Her energy level has improved and she is motivated to continue working on regular exercise and healthy dietary changes.  She is seeing improvements in appetite control on Zepbound 5 mg weekly without adverse side effect.  Increase Zepbound to 7.5 mg once weekily injection while continuing to log dietary intake aiming for 1200 cal/day which should include at least 80 g of protein daily.  Add in resistance training exercise 2 days a week.

## 2022-10-23 ENCOUNTER — Encounter: Payer: Self-pay | Admitting: Internal Medicine

## 2022-10-24 ENCOUNTER — Other Ambulatory Visit: Payer: Self-pay | Admitting: Internal Medicine

## 2022-10-24 NOTE — Telephone Encounter (Signed)
I can prescribe the scolopamine patches to help with sea sickness.  Please confirm if she has ever taken these for anything. Also, please confirm Remus Loffler is what she wants.  This is a sleeping pill.  (Usually use lorazepam or something more shorter acting).  Just let me know.

## 2022-10-25 MED ORDER — SCOPOLAMINE 1 MG/3DAYS TD PT72: 1.0000 | MEDICATED_PATCH | TRANSDERMAL | 0 refills | Status: DC

## 2022-10-25 MED ORDER — ZOLPIDEM TARTRATE 5 MG PO TABS: ORAL_TABLET | ORAL | 0 refills | Status: DC

## 2022-10-25 NOTE — Telephone Encounter (Signed)
Patient has never used the scopolamine patches before and says she will not use unless she needs them but wants to have something to take with her. Patient did request Remus Loffler because she is going to be on an overnight flight going to [place] and coming home.

## 2022-10-25 NOTE — Telephone Encounter (Signed)
Rx sent in for ambien and scolpolamine patches.

## 2022-10-26 DIAGNOSIS — H353132 Nonexudative age-related macular degeneration, bilateral, intermediate dry stage: Secondary | ICD-10-CM | POA: Diagnosis not present

## 2022-10-26 DIAGNOSIS — H2513 Age-related nuclear cataract, bilateral: Secondary | ICD-10-CM | POA: Diagnosis not present

## 2022-11-01 ENCOUNTER — Other Ambulatory Visit: Payer: Self-pay | Admitting: Internal Medicine

## 2022-11-14 ENCOUNTER — Other Ambulatory Visit (INDEPENDENT_AMBULATORY_CARE_PROVIDER_SITE_OTHER): Payer: Self-pay | Admitting: Family Medicine

## 2022-11-14 DIAGNOSIS — E559 Vitamin D deficiency, unspecified: Secondary | ICD-10-CM

## 2022-11-17 ENCOUNTER — Encounter: Payer: Self-pay | Admitting: Nurse Practitioner

## 2022-11-17 ENCOUNTER — Encounter (INDEPENDENT_AMBULATORY_CARE_PROVIDER_SITE_OTHER): Payer: Self-pay

## 2022-11-17 ENCOUNTER — Ambulatory Visit (INDEPENDENT_AMBULATORY_CARE_PROVIDER_SITE_OTHER): Payer: Medicare Other | Admitting: Nurse Practitioner

## 2022-11-17 ENCOUNTER — Encounter (INDEPENDENT_AMBULATORY_CARE_PROVIDER_SITE_OTHER): Payer: Self-pay | Admitting: Family Medicine

## 2022-11-17 ENCOUNTER — Ambulatory Visit (INDEPENDENT_AMBULATORY_CARE_PROVIDER_SITE_OTHER): Payer: Medicare Other | Admitting: Family Medicine

## 2022-11-17 ENCOUNTER — Other Ambulatory Visit: Payer: Self-pay

## 2022-11-17 ENCOUNTER — Ambulatory Visit: Payer: Medicare Other | Admitting: Nurse Practitioner

## 2022-11-17 VITALS — Ht 66.0 in | Wt 199.0 lb

## 2022-11-17 DIAGNOSIS — Z1152 Encounter for screening for COVID-19: Secondary | ICD-10-CM

## 2022-11-17 DIAGNOSIS — U071 COVID-19: Secondary | ICD-10-CM | POA: Diagnosis not present

## 2022-11-17 LAB — POC COVID19 BINAXNOW: SARS Coronavirus 2 Ag: POSITIVE — AB

## 2022-11-17 MED ORDER — NIRMATRELVIR/RITONAVIR (PAXLOVID)TABLET
3.0000 | ORAL_TABLET | Freq: Two times a day (BID) | ORAL | 0 refills | Status: AC
Start: 2022-11-17 — End: 2022-11-22

## 2022-11-17 NOTE — Assessment & Plan Note (Signed)
COVID test in office positive. Patient is interested in antiviral treatment. Will treat with Paxlovid. Recent lab work reviewed from 08/15/2022 GFR- 81.86. Counseled on common side effects, advised if she experiences any adverse effects she can stop the medication. Counseled on quarantine protocol. Advised adequate fluid intake. Strict precautions given to patient.

## 2022-11-17 NOTE — Progress Notes (Deleted)
   Office: [phone]  /  Fax: [phone]  WEIGHT SUMMARY AND BIOMETRICS  Starting Date: 12/23/21  Starting Weight: 224lb   Weight Lost Since Last Visit: 4lb   Vitals Temp: 98.3 F (36.8 C) BP: (!) 155/75 Pulse Rate: 70 SpO2: 100 %   Body Composition  Body Fat %: 44.3 % Fat Mass (lbs): 86.6 lbs Muscle Mass (lbs): 103.2 lbs Total Body Water (lbs): 75.6 lbs Visceral Fat Rating : 13     HPI  Chief Complaint: OBESITY  Milka is here to discuss her progress with her obesity treatment plan. She is on the following a lower carbohydrate, vegetable and lean protein rich diet plan and states she is following her eating plan approximately 80 % of the time. She states she is exercising 0 minutes 0 times per week.   Interval History:  Since last office visit she ***   Pharmacotherapy: ***  PHYSICAL EXAM:  Blood pressure (!) 155/75, pulse 70, temperature 98.3 F (36.8 C), height 5\' 6"  (1.676 m), weight 195 lb (88.5 kg), SpO2 100 %. Body mass index is 31.47 kg/m.  General: She is overweight, cooperative, alert, well developed, and in no acute distress. PSYCH: Has normal mood, affect and thought process.   Lungs: Normal breathing effort, no conversational dyspnea.   ASSESSMENT AND PLAN  TREATMENT PLAN FOR OBESITY:  Recommended Dietary Goals  Pierra is currently in the action stage of change. As such, her goal is to continue weight management plan. She has agreed to {MWMwtlossportion/plan2:23431}.  Behavioral Intervention  We discussed the following Behavioral Modification Strategies today: {EMWMwtlossstrategies:28914::"increasing lean protein intake","decreasing simple carbohydrates ","increasing vegetables","increasing lower glycemic fruits","increasing water intake","continue to practice mindfulness when eating","planning for success"}.  Additional resources provided today: NA  Recommended Physical Activity Goals  Jillienne has been advised to work up to 150  minutes of moderate intensity aerobic activity a week and strengthening exercises 2-3 times per week for cardiovascular health, weight loss maintenance and preservation of muscle mass.   She has agreed to {EMEXERCISE:28847::"Think about ways to increase daily physical activity and overcoming barriers to exercise"}  Pharmacotherapy changes for the treatment of obesity:   ASSOCIATED CONDITIONS ADDRESSED TODAY  Morbid obesity (HCC) with starting BMI 37      She was informed of the importance of frequent follow up visits to maximize her success with intensive lifestyle modifications for her multiple health conditions.   ATTESTASTION STATEMENTS:  Reviewed by clinician on day of visit: allergies, medications, problem list, medical history, surgical history, family history, social history, and previous encounter notes pertinent to obesity diagnosis.   I have personally spent 30 minutes total time today in preparation, patient care, nutritional counseling and documentation for this visit, including the following: review of clinical lab tests; review of medical tests/procedures/services.      Godfrey Pick Jonice Cerra, CMA DABFM, DABOM [facility] [address] [place], [place] [address] [phone]

## 2022-11-17 NOTE — Progress Notes (Signed)
MyChart Video Visit    Virtual Visit via Video Note   This visit type was conducted because this format is felt to be most appropriate for this patient at this time. Physical exam was limited by quality of the video and audio technology used for the visit. CMA was able to get the patient set up on a video visit.  Patient location: Parking lot of office. Patient and provider in visit Provider location: Office  I discussed the limitations of evaluation and management by telemedicine and the availability of in person appointments. The patient expressed understanding and agreed to proceed.  Visit Date: 11/17/2022  Today's healthcare provider: Bethanie Dicker, NP     Subjective:    Patient ID: Sherry Davila, female    DOB: January 11, 1946, 77 y.o.   MRN: [id number]  Chief Complaint  Patient presents with   Covid Exposure    Sore throat, headache    HPI Symptoms started yesterday. She recently returned from a cruise and one of the people she was on vacation with tested positive for COVID.   Respiratory illness:  Cough- Yes, mild  Congestion-    Sinus- Yes, nasal   Chest- No  Post nasal drip- Yes  Sore throat- Yes  Shortness of breath- No  Fever- No  Fatigue/Myalgia- Yes Headache- Yes Nausea/Vomiting- No Taste disturbance- No  Smell disturbance- No  Covid exposure- Yes  Covid vaccination- x 4  Flu vaccination- UTD  Medications- Alka Seltzer Deeann Cree, Dayquil   Past Medical History:  Diagnosis Date   Actinic keratosis 04/04/2018   L nose lat to supra tip   Anxiety    Basal cell carcinoma 04/04/2018   R prox nasal ala   Depression    Dysplastic nevus 05/07/2013   R abdomen - mod to severe, excision 05/29/2013   Hyperlipidemia    Hypertension    Obesity    Sleep apnea    has a cpap, doesn't wear    Varicose vein     Past Surgical History:  Procedure Laterality Date   COLONOSCOPY WITH PROPOFOL N/A 09/08/2016   Procedure: COLONOSCOPY WITH PROPOFOL;  Surgeon:  Wyline Mood, MD;  Location: [facility] ENDOSCOPY;  Service: Endoscopy;  Laterality: N/A;   COLONOSCOPY WITH PROPOFOL N/A 01/07/2020   Procedure: COLONOSCOPY WITH PROPOFOL;  Surgeon: Wyline Mood, MD;  Location: [facility] ENDOSCOPY;  Service: Gastroenterology;  Laterality: N/A;   FRACTURE SURGERY     foot, screws placed   JOINT REPLACEMENT Right 2009   knee    Family History  Problem Relation Davila of Onset   Cancer Mother    Heart failure Mother    Heart disease Mother    Breast cancer Mother 97   Heart attack Mother    Obesity Father    Depression Father    Early death Father    Hyperlipidemia Father    AAA (abdominal aortic aneurysm) Father    Hypertension Father    Heart disease Father    Hypertension Sister     Social History   Socioeconomic History   Marital status: Married    Spouse name: Not on file   Number of children: Not on file   Years of education: Not on file   Highest education level: 12th grade  Occupational History   Not on file  Tobacco Use   Smoking status: Former    Types: Cigarettes    Quit date: 01/19/1988    Years since quitting: 34.8   Smokeless tobacco: Never  Vaping Use  Vaping Use: Never used  Substance and Sexual Activity   Alcohol use: Yes    Alcohol/week: 3.0 standard drinks of alcohol    Types: 3 Shots of liquor per week    Comment: 3 drinks a week socially    Drug use: No   Sexual activity: Not on file  Other Topics Concern   Not on file  Social History Narrative   Not on file   Social Determinants of Health   Financial Resource Strain: Low Risk  (08/23/2022)   Overall Financial Resource Strain (CARDIA)    Difficulty of Paying Living Expenses: Not hard at all  Food Insecurity: No Food Insecurity (08/23/2022)   Hunger Vital Sign    Worried About Running Out of Food in the Last Year: Never true    Ran Out of Food in the Last Year: Never true  Transportation Needs: No Transportation Needs (08/23/2022)   PRAPARE - Scientist, research (physical sciences) (Medical): No    Lack of Transportation (Non-Medical): No  Physical Activity: Insufficiently Active (08/23/2022)   Exercise Vital Sign    Days of Exercise per Week: 3 days    Minutes of Exercise per Session: 20 min  Stress: No Stress Concern Present (08/23/2022)   [employer] of Occupational Health - Occupational Stress Questionnaire    Feeling of Stress : Not at all  Social Connections: Socially Integrated (08/23/2022)   Social Connection and Isolation Panel [NHANES]    Frequency of Communication with Friends and Family: Twice a week    Frequency of Social Gatherings with Friends and Family: Once a week    Attends Religious Services: 1 to 4 times per year    Active Member of [employer] or Organizations: Yes    Attends Engineer, structural: More than 4 times per year    Marital Status: Married  Catering manager Violence: Not At Risk (10/11/2021)   Humiliation, Afraid, Rape, and Kick questionnaire    Fear of Current or Ex-Partner: No    Emotionally Abused: No    Physically Abused: No    Sexually Abused: No    Outpatient Medications Prior to Visit  Medication Sig Dispense Refill   amLODipine (NORVASC) 10 MG tablet TAKE 1 TABLET BY MOUTH DAILY 90 tablet 1   atenolol (TENORMIN) 25 MG tablet Take 1 tablet (25 mg total) by mouth daily. 90 tablet 3   FLUoxetine (PROZAC) 40 MG capsule Take 1 capsule (40 mg total) by mouth daily. 90 capsule 1   losartan-hydrochlorothiazide (HYZAAR) 100-12.5 MG tablet Take 1 tablet by mouth daily. 90 tablet 1   tirzepatide (ZEPBOUND) 7.5 MG/0.5ML Pen Inject 7.5 mg into the skin once a week. 2 mL 0   zolpidem (AMBIEN) 5 MG tablet Take one tablet q hs prn (to use for upcoming flight). 2 tablet 0   No facility-administered medications prior to visit.    No Known Allergies  ROS See HPI    Objective:    Physical Exam  Ht 5\' 6"  (1.676 m)   Wt 199 lb (90.3 kg)   BMI 32.12 kg/m  Wt Readings from Last 3 Encounters:  11/17/22 199 lb  (90.3 kg)  10/18/22 199 lb (90.3 kg)  09/19/22 204 lb 3.2 oz (92.6 kg)   GENERAL: alert, oriented, appears well and in no acute distress   HEENT: atraumatic, conjunttiva clear, no obvious abnormalities on inspection of external nose and ears   NECK: normal movements of the head and neck   LUNGS: on inspection  no signs of respiratory distress, breathing rate appears normal, no obvious gross SOB, gasping or wheezing   CV: no obvious cyanosis   MS: moves all visible extremities without noticeable abnormality   PSYCH/NEURO: pleasant and cooperative, no obvious depression or anxiety, speech and thought processing grossly intact    Assessment & Plan:   Problem List Items Addressed This Visit       Other   COVID-19 - Primary    COVID test in office positive. Patient is interested in antiviral treatment. Will treat with Paxlovid. Recent lab work reviewed from 08/15/2022 GFR- 81.86. Counseled on common side effects, advised if she experiences any adverse effects she can stop the medication. Counseled on quarantine protocol. Advised adequate fluid intake. Strict precautions given to patient.       Relevant Medications   nirmatrelvir/ritonavir (PAXLOVID) 20 x 150 MG & 10 x 100MG  TABS   Other Visit Diagnoses     Encounter for screening for COVID-19           I have discontinued Sherry Davila's zolpidem. I am also having her start on nirmatrelvir/ritonavir. Additionally, I am having her maintain her atenolol, FLUoxetine, losartan-hydrochlorothiazide, Zepbound, and amLODipine.  Meds ordered this encounter  Medications   nirmatrelvir/ritonavir (PAXLOVID) 20 x 150 MG & 10 x 100MG  TABS    Sig: Take 3 tablets by mouth 2 (two) times daily for 5 days. (Take nirmatrelvir 150 mg two tablets twice daily for 5 days and ritonavir 100 mg one tablet twice daily for 5 days) Patient GFR is 81.86    Dispense:  30 tablet    Refill:  0    Order Specific Question:   Supervising Provider    Answer:    Birdie Sons, ERIC G [[id number]]    I discussed the assessment and treatment plan with the patient. The patient was provided an opportunity to ask questions and all were answered. The patient agreed with the plan and demonstrated an understanding of the instructions.   The patient was advised to call back or seek an in-person evaluation if the symptoms worsen or if the condition fails to improve as anticipated.   Bethanie Dicker, NP [facility] [phone] (phone) [phone] (fax)  [facility]

## 2022-11-21 ENCOUNTER — Other Ambulatory Visit (INDEPENDENT_AMBULATORY_CARE_PROVIDER_SITE_OTHER): Payer: Medicare Other

## 2022-11-21 DIAGNOSIS — E785 Hyperlipidemia, unspecified: Secondary | ICD-10-CM

## 2022-11-21 DIAGNOSIS — I10 Essential (primary) hypertension: Secondary | ICD-10-CM

## 2022-11-21 DIAGNOSIS — R739 Hyperglycemia, unspecified: Secondary | ICD-10-CM | POA: Diagnosis not present

## 2022-11-21 LAB — BASIC METABOLIC PANEL
BUN: 18 mg/dL (ref 6–23)
CO2: 27 mEq/L (ref 19–32)
Calcium: 9.5 mg/dL (ref 8.4–10.5)
Chloride: 101 mEq/L (ref 96–112)
Creatinine, Ser: 0.6 mg/dL (ref 0.40–1.20)
GFR: 87.71 mL/min (ref >60.00)
Glucose, Bld: 91 mg/dL (ref 70–99)
Potassium: 4 mEq/L (ref 3.5–5.1)
Sodium: 136 mEq/L (ref 135–145)

## 2022-11-21 LAB — HEPATIC FUNCTION PANEL
ALT: 30 U/L (ref 0–35)
AST: 26 U/L (ref 0–37)
Albumin: 4.2 g/dL (ref 3.5–5.2)
Alkaline Phosphatase: 128 U/L — ABNORMAL HIGH (ref 39–117)
Bilirubin, Direct: 0.1 mg/dL (ref 0.0–0.3)
Total Bilirubin: 0.5 mg/dL (ref 0.2–1.2)
Total Protein: 7.3 g/dL (ref 6.0–8.3)

## 2022-11-21 LAB — LIPID PANEL
Cholesterol: 201 mg/dL — ABNORMAL HIGH (ref 0–200)
HDL: 62 mg/dL (ref >39.00)
LDL Cholesterol: 109 mg/dL — ABNORMAL HIGH (ref 0–99)
NonHDL: 139.27
Total CHOL/HDL Ratio: 3
Triglycerides: 150 mg/dL — ABNORMAL HIGH (ref 0.0–149.0)
VLDL: 30 mg/dL (ref 0.0–40.0)

## 2022-11-21 LAB — HEMOGLOBIN A1C: Hgb A1c MFr Bld: 5.2 % (ref 4.6–6.5)

## 2022-11-23 ENCOUNTER — Encounter: Payer: Self-pay | Admitting: Internal Medicine

## 2022-11-23 ENCOUNTER — Ambulatory Visit (INDEPENDENT_AMBULATORY_CARE_PROVIDER_SITE_OTHER): Payer: Medicare Other | Admitting: Internal Medicine

## 2022-11-23 VITALS — BP 112/72 | HR 71 | Temp 97.9°F | Resp 16 | Ht 66.0 in | Wt 195.4 lb

## 2022-11-23 DIAGNOSIS — D692 Other nonthrombocytopenic purpura: Secondary | ICD-10-CM | POA: Diagnosis not present

## 2022-11-23 DIAGNOSIS — I10 Essential (primary) hypertension: Secondary | ICD-10-CM

## 2022-11-23 DIAGNOSIS — G720 Drug-induced myopathy: Secondary | ICD-10-CM | POA: Diagnosis not present

## 2022-11-23 DIAGNOSIS — Z0000 Encounter for general adult medical examination without abnormal findings: Secondary | ICD-10-CM

## 2022-11-23 DIAGNOSIS — E785 Hyperlipidemia, unspecified: Secondary | ICD-10-CM | POA: Diagnosis not present

## 2022-11-23 DIAGNOSIS — K219 Gastro-esophageal reflux disease without esophagitis: Secondary | ICD-10-CM | POA: Diagnosis not present

## 2022-11-23 DIAGNOSIS — R739 Hyperglycemia, unspecified: Secondary | ICD-10-CM

## 2022-11-23 DIAGNOSIS — F320 Major depressive disorder, single episode, mild: Secondary | ICD-10-CM

## 2022-11-23 DIAGNOSIS — T466X5A Adverse effect of antihyperlipidemic and antiarteriosclerotic drugs, initial encounter: Secondary | ICD-10-CM | POA: Diagnosis not present

## 2022-11-23 DIAGNOSIS — R748 Abnormal levels of other serum enzymes: Secondary | ICD-10-CM | POA: Diagnosis not present

## 2022-11-23 DIAGNOSIS — U071 COVID-19: Secondary | ICD-10-CM | POA: Diagnosis not present

## 2022-11-23 MED ORDER — EZETIMIBE 10 MG PO TABS
10.0000 mg | ORAL_TABLET | Freq: Every day | ORAL | 1 refills | Status: DC
Start: 2022-11-23 — End: 2023-08-29

## 2022-11-23 NOTE — Assessment & Plan Note (Signed)
The 10-year ASCVD risk score (Arnett DK, et al., 2019) is: 16.6%   Values used to calculate the score:     Age: 77 years     Sex: Female     Is Non-Hispanic African American: No     Diabetic: No     Tobacco smoker: No     Systolic Blood Pressure: 112 mmHg     Is BP treated: Yes     HDL Cholesterol: 62 mg/dL     Total Cholesterol: 201 mg/dL  Have discussed calculated cholesterol risk. Had muscle aches with statin medication.  Taking Zetia. Working on diet and exercise.  Losing weight.  Zetia.  Follow lipid panel.

## 2022-11-23 NOTE — Assessment & Plan Note (Signed)
Physical today 11/23/22..  Mammogram 10/14/21 - Birads I. colonoscopy 12/2019.

## 2022-11-23 NOTE — Progress Notes (Unsigned)
Subjective:    Patient ID: Sherry Davila, female    DOB: 03/25/1946, 77 y.o.   MRN: [id number]  Patient here for  Chief Complaint  Patient presents with   Annual Exam    HPI With past history of hypercholesterolemia and hypertension.  Comes in today to follow up on these issues as well as for a complete physical exam.    Past Medical History:  Diagnosis Date   Actinic keratosis 04/04/2018   L nose lat to supra tip   Anxiety    Basal cell carcinoma 04/04/2018   R prox nasal ala   Depression    Dysplastic nevus 05/07/2013   R abdomen - mod to severe, excision 05/29/2013   Hyperlipidemia    Hypertension    Obesity    Sleep apnea    has a cpap, doesn't wear    Varicose vein    Past Surgical History:  Procedure Laterality Date   COLONOSCOPY WITH PROPOFOL N/A 09/08/2016   Procedure: COLONOSCOPY WITH PROPOFOL;  Surgeon: Wyline Mood, MD;  Location: [facility] ENDOSCOPY;  Service: Endoscopy;  Laterality: N/A;   COLONOSCOPY WITH PROPOFOL N/A 01/07/2020   Procedure: COLONOSCOPY WITH PROPOFOL;  Surgeon: Wyline Mood, MD;  Location: [facility] ENDOSCOPY;  Service: Gastroenterology;  Laterality: N/A;   FRACTURE SURGERY     foot, screws placed   JOINT REPLACEMENT Right 2009   knee   Family History  Problem Relation Age of Onset   Cancer Mother    Heart failure Mother    Heart disease Mother    Breast cancer Mother 24   Heart attack Mother    Obesity Father    Depression Father    Early death Father    Hyperlipidemia Father    AAA (abdominal aortic aneurysm) Father    Hypertension Father    Heart disease Father    Hypertension Sister    Social History   Socioeconomic History   Marital status: Married    Spouse name: Not on file   Number of children: Not on file   Years of education: Not on file   Highest education level: 12th grade  Occupational History   Not on file  Tobacco Use   Smoking status: Former    Types: Cigarettes    Quit date: 01/19/1988    Years since  quitting: 34.8   Smokeless tobacco: Never  Vaping Use   Vaping Use: Never used  Substance and Sexual Activity   Alcohol use: Yes    Alcohol/week: 3.0 standard drinks of alcohol    Types: 3 Shots of liquor per week    Comment: 3 drinks a week socially    Drug use: No   Sexual activity: Not on file  Other Topics Concern   Not on file  Social History Narrative   Not on file   Social Determinants of Health   Financial Resource Strain: Low Risk  (08/23/2022)   Overall Financial Resource Strain (CARDIA)    Difficulty of Paying Living Expenses: Not hard at all  Food Insecurity: No Food Insecurity (08/23/2022)   Hunger Vital Sign    Worried About Running Out of Food in the Last Year: Never true    Ran Out of Food in the Last Year: Never true  Transportation Needs: No Transportation Needs (08/23/2022)   PRAPARE - Administrator, Civil Service (Medical): No    Lack of Transportation (Non-Medical): No  Physical Activity: Insufficiently Active (08/23/2022)   Exercise Vital Sign  Days of Exercise per Week: 3 days    Minutes of Exercise per Session: 20 min  Stress: No Stress Concern Present (08/23/2022)   [employer] of Occupational Health - Occupational Stress Questionnaire    Feeling of Stress : Not at all  Social Connections: Socially Integrated (08/23/2022)   Social Connection and Isolation Panel [NHANES]    Frequency of Communication with Friends and Family: Twice a week    Frequency of Social Gatherings with Friends and Family: Once a week    Attends Religious Services: 1 to 4 times per year    Active Member of [employer] or Organizations: Yes    Attends Engineer, structural: More than 4 times per year    Marital Status: Married     Review of Systems     Objective:     BP 112/72   Pulse 71   Temp 97.9 F (36.6 C)   Resp 16   Ht 5\' 6"  (1.676 m)   Wt 195 lb 6.4 oz (88.6 kg)   SpO2 98%   BMI 31.54 kg/m  Wt Readings from Last 3 Encounters:  11/23/22 195  lb 6.4 oz (88.6 kg)  11/17/22 199 lb (90.3 kg)  10/18/22 199 lb (90.3 kg)    Physical Exam   Outpatient Encounter Medications as of 11/23/2022  Medication Sig   ezetimibe (ZETIA) 10 MG tablet Take 1 tablet (10 mg total) by mouth daily.   amLODipine (NORVASC) 10 MG tablet TAKE 1 TABLET BY MOUTH DAILY   atenolol (TENORMIN) 25 MG tablet Take 1 tablet (25 mg total) by mouth daily.   FLUoxetine (PROZAC) 40 MG capsule Take 1 capsule (40 mg total) by mouth daily.   losartan-hydrochlorothiazide (HYZAAR) 100-12.5 MG tablet Take 1 tablet by mouth daily.   tirzepatide (ZEPBOUND) 7.5 MG/0.5ML Pen Inject 7.5 mg into the skin once a week.   No facility-administered encounter medications on file as of 11/23/2022.     Lab Results  Component Value Date   WBC 5.9 08/16/2021   HGB 13.9 08/16/2021   HCT 41.6 08/16/2021   PLT 258.0 08/16/2021   GLUCOSE 91 11/21/2022   CHOL 201 (H) 11/21/2022   TRIG 150.0 (H) 11/21/2022   HDL 62.00 11/21/2022   LDLCALC 109 (H) 11/21/2022   ALT 30 11/21/2022   AST 26 11/21/2022   NA 136 11/21/2022   K 4.0 11/21/2022   CL 101 11/21/2022   CREATININE 0.60 11/21/2022   BUN 18 11/21/2022   CO2 27 11/21/2022   TSH 1.990 12/23/2021   HGBA1C 5.2 11/21/2022    DG Chest 2 View  Result Date: 04/25/2022 CLINICAL DATA:  Fall, pain, bilateral rib pain EXAM: CHEST - 2 VIEW COMPARISON:  None Available. FINDINGS: Mild cardiomegaly. Both lungs are clear. Disc degenerative disease of the thoracic spine. IMPRESSION: 1.  Mild cardiomegaly without acute abnormality of the lungs. 2.  No obvious displaced rib fractures on chest radiographs. Electronically Signed   By: Jearld Lesch M.D.   On: 04/25/2022 11:44       Assessment & Plan:  Healthcare maintenance Assessment & Plan: Physical today 11/23/22..  Mammogram 10/14/21 - Birads I. colonoscopy 12/2019.    Hyperlipidemia, unspecified hyperlipidemia type Assessment & Plan: The 10-year ASCVD risk score (Arnett DK, et al., 2019) is:  16.6%   Values used to calculate the score:     Age: 69 years     Sex: Female     Is Non-Hispanic African American: No     Diabetic:  No     Tobacco smoker: No     Systolic Blood Pressure: 112 mmHg     Is BP treated: Yes     HDL Cholesterol: 62 mg/dL     Total Cholesterol: 201 mg/dL  Have discussed calculated cholesterol risk. Had muscle aches with statin medication.  Taking Zetia. Working on diet and exercise.  Losing weight.  Given cholesterol not at goal, discussed repatha.  Agreeable.  Stop zetia.  Start repatha.  Follow lipid panel.    Elevated alkaline phosphatase level -     Hepatic function panel; Future  Other orders -     Ezetimibe; Take 1 tablet (10 mg total) by mouth daily.  Dispense: 90 tablet; Refill: 1     [employer], MD

## 2022-11-24 ENCOUNTER — Encounter: Payer: Self-pay | Admitting: Internal Medicine

## 2022-11-24 NOTE — Assessment & Plan Note (Signed)
Continues on amlodipine, hyzaar and atenolol.  No changes in medication.  Follow pressures.  Follow metabolic panel.

## 2022-11-24 NOTE — Assessment & Plan Note (Signed)
Per review has seen Dr Kowalski.  Continue f/u with dermatology.  

## 2022-11-24 NOTE — Assessment & Plan Note (Signed)
Continues on prozac.  Follow. Overall appears to be doing better. Feels better.  

## 2022-11-24 NOTE — Assessment & Plan Note (Signed)
04/2022 EGD - Impression: - Normal esophagus. Gastroesophageal flap valve classified as Hill Grade II (fold present, opens with respiration). Normal gastroesophageal junction, cardia, gastric fundus and gastric body, Gastritis. Biopsied. Normal examined duodenum.  No upper symptoms reported.  

## 2022-11-24 NOTE — Assessment & Plan Note (Signed)
Muscle aches with statin.

## 2022-11-24 NOTE — Assessment & Plan Note (Signed)
Low carb diet and exercise. Follow met b and a1c.  Lab Results  Component Value Date   HGBA1C 5.2 11/21/2022

## 2022-11-24 NOTE — Assessment & Plan Note (Signed)
Diagnosed last week.  Currently doing better.  Only residual symptoms - fatigue.  Continue rest.  Follow.

## 2022-11-24 NOTE — Assessment & Plan Note (Signed)
Alkaline phos level - slightly increased - recent labs.  Continue diet and exercise.  Recheck liver panel.

## 2022-11-29 ENCOUNTER — Encounter (INDEPENDENT_AMBULATORY_CARE_PROVIDER_SITE_OTHER): Payer: Self-pay | Admitting: Family Medicine

## 2022-11-29 ENCOUNTER — Ambulatory Visit (INDEPENDENT_AMBULATORY_CARE_PROVIDER_SITE_OTHER): Payer: Medicare Other | Admitting: Family Medicine

## 2022-11-29 VITALS — BP 117/74 | HR 74 | Temp 98.0°F | Ht 66.0 in | Wt 191.0 lb

## 2022-11-29 DIAGNOSIS — R632 Polyphagia: Secondary | ICD-10-CM | POA: Insufficient documentation

## 2022-11-29 DIAGNOSIS — Z6830 Body mass index (BMI) 30.0-30.9, adult: Secondary | ICD-10-CM

## 2022-11-29 DIAGNOSIS — E785 Hyperlipidemia, unspecified: Secondary | ICD-10-CM | POA: Diagnosis not present

## 2022-11-29 DIAGNOSIS — I10 Essential (primary) hypertension: Secondary | ICD-10-CM | POA: Diagnosis not present

## 2022-11-29 NOTE — Assessment & Plan Note (Signed)
Lab Results  Component Value Date   CHOL 201 (H) 11/21/2022   HDL 62.00 11/21/2022   LDLCALC 109 (H) 11/21/2022   TRIG 150.0 (H) 11/21/2022   CHOLHDL 3 11/21/2022   We reviewed her labs, obtained by her PCP 11/21/2022 and compared to her previous readings.  She has seen significant reduction in her LDL.  She is tolerating Zetia 10 mg once daily well without adverse side effect.  She has a history of statin myopathy.  She is adherent to a low saturated fat diet.  She plans to increase her exercise frequency.  Continue current medication and low saturated fat diet.

## 2022-11-29 NOTE — Assessment & Plan Note (Signed)
Reviewed bioimpedance.  Patient is actively working on increasing her protein intake with a target of 85 to 90 g/day.  She may add in a protein shake daily to help meet her protein target.  She has plenty of satiety from tirzepatide 5 mg once weekly injection for obesity management.  She has a prescription for 7.5 mg dose which she has not yet started.  She denies adverse side effects from tirzepatide.  She does plan to meet with a personal trainer at the gym and initiate weight training 2-3 times a week to help regain muscle mass.  She initiated tirzepatide March 2024 at 212 pounds. Continue tirzepatide at 5 mg once weekly injection.  When she finishes the 5 mg dose, she does have 7.5 mg dose waiting.  Continue working on a reduced calorie healthy diet focused on lean protein and fiber with meals.

## 2022-11-29 NOTE — Assessment & Plan Note (Signed)
Blood pressure is well-controlled.  She is on losartan/HCTZ 100/12.5 mg once daily, atenolol 25 mg once daily and amlodipine 10 mg once daily.  She has seen improved blood pressure control with weight reduction.  She has lost 14.7% total body weight in the past 11 months of medically supervised weight management.  Follow-up with PCP.  As she loses additional weight, she may need a reduction of her blood pressure medications.

## 2022-11-29 NOTE — Progress Notes (Signed)
Office: [phone]  /  Fax: [phone]  WEIGHT SUMMARY AND BIOMETRICS  Starting Date: 12/23/21  Starting Weight: 224lb   Weight Lost Since Last Visit: 8lb   Vitals Temp: 98 F (36.7 C) BP: 117/74 Pulse Rate: 74 SpO2: 95 %   Body Composition  Body Fat %: 44.2 % Fat Mass (lbs): 84.8 lbs Muscle Mass (lbs): 101.6 lbs Total Body Water (lbs): 75.6 lbs Visceral Fat Rating : 13     HPI  Chief Complaint: OBESITY  Norita is here to discuss her progress with her obesity treatment plan. She is on the keeping a food journal and adhering to recommended goals of 1800 calories and 81 protein and states she is following her eating plan approximately 80 % of the time. She states she is exercising 0 minutes 0 times per week.   Interval History:  Since last office visit she is down 8 lb She is doing well on Zepbound 5 mg weekly She is journaling and getting 1200 cal/ day and aims for 85 g of protein daily She is hydrating well with water She was sick with COVID-19 between visits She plans to go to the gym to workout and get a trainer She is down 33 lb in the past 11 mos of medically supervised weight management.  This is a 14.7% total body weight loss. She did not start initiating weight loss until she started on Zepbound-out-of-pocket She was in [place] in June, took a cruise and still was mindful of her portion sizes and food choices.  Pharmacotherapy: Zepbound 5 mg weekly  PHYSICAL EXAM:  Blood pressure 117/74, pulse 74, temperature 98 F (36.7 C), height 5\' 6"  (1.676 m), weight 191 lb (86.6 kg), SpO2 95 %. Body mass index is 30.83 kg/m.  General: She is overweight, cooperative, alert, well developed, and in no acute distress. PSYCH: Has normal mood, affect and thought process.   Lungs: Normal breathing effort, no conversational dyspnea.   ASSESSMENT AND PLAN  TREATMENT PLAN FOR OBESITY:  Recommended Dietary Goals  Consuello is currently in the action stage of  change. As such, her goal is to continue weight management plan. She has agreed to keeping a food journal and adhering to recommended goals of 1200 calories and 85 g of  protein.  Behavioral Intervention  We discussed the following Behavioral Modification Strategies today: increasing lean protein intake, decreasing simple carbohydrates , increasing vegetables, increasing lower glycemic fruits, increasing water intake, keeping healthy foods at home, avoiding temptations and identifying enticing environmental cues, continue to practice mindfulness when eating, and planning for success.  Additional resources provided today: NA  Recommended Physical Activity Goals  Dulcy has been advised to work up to 150 minutes of moderate intensity aerobic activity a week and strengthening exercises 2-3 times per week for cardiovascular health, weight loss maintenance and preservation of muscle mass.   She has agreed to Increase the intensity, frequency or duration of strengthening exercises   Pharmacotherapy changes for the treatment of obesity: Zepbound 5 mg weekly  ASSOCIATED CONDITIONS ADDRESSED TODAY  Hyperlipidemia, unspecified hyperlipidemia type Assessment & Plan: Lab Results  Component Value Date   CHOL 201 (H) 11/21/2022   HDL 62.00 11/21/2022   LDLCALC 109 (H) 11/21/2022   TRIG 150.0 (H) 11/21/2022   CHOLHDL 3 11/21/2022   We reviewed her labs, obtained by her PCP 11/21/2022 and compared to her previous readings.  She has seen significant reduction in her LDL.  She is tolerating Zetia 10 mg once daily well without  adverse side effect.  She has a history of statin myopathy.  She is adherent to a low saturated fat diet.  She plans to increase her exercise frequency.  Continue current medication and low saturated fat diet.   Morbid obesity (HCC) with starting BMI 37 Assessment & Plan: Reviewed bioimpedance.  Patient is actively working on increasing her protein intake with a target of 85 to  90 g/day.  She may add in a protein shake daily to help meet her protein target.  She has plenty of satiety from tirzepatide 5 mg once weekly injection for obesity management.  She has a prescription for 7.5 mg dose which she has not yet started.  She denies adverse side effects from tirzepatide.  She does plan to meet with a personal trainer at the gym and initiate weight training 2-3 times a week to help regain muscle mass.  She initiated tirzepatide March 2024 at 212 pounds. Continue tirzepatide at 5 mg once weekly injection.  When she finishes the 5 mg dose, she does have 7.5 mg dose waiting.  Continue working on a reduced calorie healthy diet focused on lean protein and fiber with meals.     BMI 30.0-30.9,adult  Essential hypertension Assessment & Plan: Blood pressure is well-controlled.  She is on losartan/HCTZ 100/12.5 mg once daily, atenolol 25 mg once daily and amlodipine 10 mg once daily.  She has seen improved blood pressure control with weight reduction.  She has lost 14.7% total body weight in the past 11 months of medically supervised weight management.  Follow-up with PCP.  As she loses additional weight, she may need a reduction of her blood pressure medications.       She was informed of the importance of frequent follow up visits to maximize her success with intensive lifestyle modifications for her multiple health conditions.   ATTESTASTION STATEMENTS:  Reviewed by clinician on day of visit: allergies, medications, problem list, medical history, surgical history, family history, social history, and previous encounter notes pertinent to obesity diagnosis.   I have personally spent 30 minutes total time today in preparation, patient care, nutritional counseling and documentation for this visit, including the following: review of clinical lab tests; review of medical tests/procedures/services.      Glennis Brink, DO DABFM, DABOM [facility] [address] [place], [place] [address] [phone]

## 2022-11-30 ENCOUNTER — Ambulatory Visit
Admission: RE | Admit: 2022-11-30 | Discharge: 2022-11-30 | Disposition: A | Discharge status: Home / Self Care | Payer: Medicare Other | Source: Ambulatory Visit | Attending: Internal Medicine | Admitting: Internal Medicine

## 2022-11-30 DIAGNOSIS — Z1231 Encounter for screening mammogram for malignant neoplasm of breast: Secondary | ICD-10-CM | POA: Diagnosis present

## 2022-12-07 ENCOUNTER — Other Ambulatory Visit: Payer: Self-pay

## 2022-12-07 ENCOUNTER — Telehealth: Payer: Self-pay | Admitting: Internal Medicine

## 2022-12-07 MED ORDER — ZEPBOUND 7.5 MG/0.5ML ~~LOC~~ SOAJ
7.5000 mg | SUBCUTANEOUS | 0 refills | Status: DC
Start: 2022-12-07 — End: 2022-12-12

## 2022-12-07 NOTE — Telephone Encounter (Signed)
Refill sent to [facility] °

## 2022-12-07 NOTE — Telephone Encounter (Signed)
Ok to send in 7.5mg .  she is being followed by [facility].  In reviewing their note, they did ok going up to 7.5mg  q week - zepbound

## 2022-12-07 NOTE — Telephone Encounter (Signed)
This dose was previously prescribed by weight management. Patient was not ready to increase at the time rx was sent in. Ok to send in rx for mounjaro 7.5 mg for her?

## 2022-12-07 NOTE — Telephone Encounter (Signed)
Prescription Request  12/07/2022  LOV: 11/23/2022  What is the name of the medication or equipment? tirzepatide (ZEPBOUND) 7.5 MG/0.5ML Pen   Have you contacted your pharmacy to request a refill? Yes   Which pharmacy would you like this sent to?   [facility]    Patient notified that their request is being sent to the clinical staff for review and that they should receive a response within 2 business days.   Please advise at [facility] [phone]

## 2022-12-09 ENCOUNTER — Telehealth: Payer: Self-pay

## 2022-12-09 NOTE — Telephone Encounter (Signed)
[facility] had the Zepbound 5.0 prescription transferred from [facility] but they do not have her new prescription for Zepbound 7.5.  Patient states she would like for [place] to please send the tirzepatide (ZEPBOUND) 7.5 MG/0.5ML Pen prescription to [facility].  I realized after our call ended that I needed to clarify which [facility] she would like for this prescription to go to.  I left a voicemail for patient asking her to please let us know.  Patient called back to state we need to send it to the [facility] on [address] in [place].

## 2022-12-12 ENCOUNTER — Other Ambulatory Visit: Payer: Self-pay

## 2022-12-12 MED ORDER — ZEPBOUND 7.5 MG/0.5ML ~~LOC~~ SOAJ
7.5000 mg | SUBCUTANEOUS | 0 refills | Status: DC
Start: 2022-12-12 — End: 2023-01-24

## 2022-12-12 NOTE — Telephone Encounter (Signed)
Refill sent. Patient is aware.

## 2022-12-21 ENCOUNTER — Other Ambulatory Visit (INDEPENDENT_AMBULATORY_CARE_PROVIDER_SITE_OTHER): Payer: Medicare Other

## 2022-12-21 DIAGNOSIS — R748 Abnormal levels of other serum enzymes: Secondary | ICD-10-CM | POA: Diagnosis not present

## 2022-12-21 LAB — HEPATIC FUNCTION PANEL
ALT: 13 U/L (ref 0–35)
AST: 15 U/L (ref 0–37)
Albumin: 4.2 g/dL (ref 3.5–5.2)
Alkaline Phosphatase: 101 U/L (ref 39–117)
Bilirubin, Direct: 0.2 mg/dL (ref 0.0–0.3)
Total Bilirubin: 0.6 mg/dL (ref 0.2–1.2)
Total Protein: 7.2 g/dL (ref 6.0–8.3)

## 2022-12-22 ENCOUNTER — Encounter (INDEPENDENT_AMBULATORY_CARE_PROVIDER_SITE_OTHER): Payer: Self-pay | Admitting: Internal Medicine

## 2022-12-22 ENCOUNTER — Ambulatory Visit (INDEPENDENT_AMBULATORY_CARE_PROVIDER_SITE_OTHER): Payer: Medicare Other | Admitting: Internal Medicine

## 2022-12-22 ENCOUNTER — Other Ambulatory Visit (HOSPITAL_COMMUNITY): Payer: Self-pay

## 2022-12-22 DIAGNOSIS — G4733 Obstructive sleep apnea (adult) (pediatric): Secondary | ICD-10-CM

## 2022-12-22 DIAGNOSIS — Z6837 Body mass index (BMI) 37.0-37.9, adult: Secondary | ICD-10-CM | POA: Diagnosis not present

## 2022-12-22 DIAGNOSIS — I10 Essential (primary) hypertension: Secondary | ICD-10-CM | POA: Diagnosis not present

## 2022-12-22 NOTE — Assessment & Plan Note (Signed)
Patient has lost approximately 40 pounds close to 20% of total body weight.  Her protein to adipose tissue loss ratio is less than 20%, so she is doing a good job preserving muscle mass.  We again emphasized the importance of getting approximately 30 to 40 g of protein per meal and shooting for a daily total of 90 g.  Also participating in strengthening activities at least 2-3 times a week.  She is on Zepbound and will be increasing to 7.5 mg.  Patient counseled today on eliminating internal biases about the use of antiobesity medication.  We also discussed maintaining lowest effective dose.  She will continue to track and journal to maintain a reduced calorie state of about 1200 cal a day.  Her calculated BMR is 1483 so this will result in gradual and sustainable weight loss.

## 2022-12-22 NOTE — Assessment & Plan Note (Signed)
Patient reports good adherence with CPAP therapy.  She has lost 20% of total body weight and may benefit from a repeat sleep study to see if she still needs CPAP.  There are no reports of leaks at there is probably been some changes in her facial contours.  Continue PAP therapy for now.

## 2022-12-22 NOTE — Progress Notes (Signed)
Office: [phone]  /  Fax: [phone]  WEIGHT SUMMARY AND BIOMETRICS  Vitals Temp: 98.1 F (36.7 C) BP: 132/76 Pulse Rate: 67 SpO2: 99 %   Anthropometric Measurements Height: 5\' 6"  (1.676 m) Weight: 186 lb (84.4 kg) BMI (Calculated): 30.04 Weight at Last Visit: 191 lb Weight Lost Since Last Visit: 5 lb Weight Gained Since Last Visit: 0 lb Starting Weight: 224 lb Total Weight Loss (lbs): 38 lb (17.2 kg)   Body Composition  Body Fat %: 42.3 % Fat Mass (lbs): 78 lbs Muscle Mass (lbs): 102.4 lbs Total Body Water (lbs): 70.4 lbs Visceral Fat Rating : 12    No data recorded Today's Visit #: 10  Starting Date: 12/23/21   HPI  Chief Complaint: OBESITY  Sherry Davila is here to discuss her progress with her obesity treatment plan. She is on the keeping a food journal and adhering to recommended goals of 1200 calories and 85 protein and states she is following her eating plan approximately 80 % of the time. She states she is exercising 60+ minutes 3 times per week.  Interval History:  This is my first encounter with Sherry Davila.  She is a former patient of Dr. Cathey Endow who will be transitioning to me due to changes in provider location.  Her peak weight was 232 pounds she is currently following a reduced calorie nutrition plan targeting 1200 cal a day and about 80 to 90 g of protein daily.  She is tracking and journaling she is also exercising at the gym.  She is currently on tirzepatide 5 mg once a week and has been advised to increase to 7.5 mg which she has not done yet.  She has been paying for medication out-of-pocket.    Since last office visit she has lost 5 pounds since last office visit  She reports good adherence to reduced calorie nutritional plan. She has been working on reading food labels, increasing protein intake at every meal, eating more fruits, eating more vegetables, drinking more water, journaling and tracking calories, making healthier choices, continues to  exercise, mindfulness around eating, and controlling orexigenic cues and stimuli.  She mostly does 2 meals a day and this is her baseline she is never hungry in the morning and therefore is not inclined to eat.  She has been trying to meet her protein goals daily.  Orixegenic Control: Denies problems with appetite and hunger signals.  Denies problems with satiety and satiation.  Denies problems with eating patterns and portion control.  Denies abnormal cravings. Denies feeling deprived or restricted.   Barriers identified: strong hunger signals and appetite.   Pharmacotherapy for weight loss: She is currently taking Zepbound with adequate clinical response  and without side effects..    ASSESSMENT AND PLAN  TREATMENT PLAN FOR OBESITY:  Recommended Dietary Goals  [name] is currently in the action stage of change. As such, her goal is to continue weight management plan. She has agreed to: continue current plan  Behavioral Intervention  We discussed the following Behavioral Modification Strategies today: increasing lean protein intake, decreasing simple carbohydrates , increasing vegetables, increasing lower glycemic fruits, increasing fiber rich foods, avoiding skipping meals, increasing water intake, continue to practice mindfulness when eating, and planning for success.  Additional resources provided today: None  Recommended Physical Activity Goals  [name] has been advised to work up to 150 minutes of moderate intensity aerobic activity a week and strengthening exercises 2-3 times per week for cardiovascular health, weight loss maintenance and preservation of  muscle mass.   She has agreed to :  Continue current level of physical activity  and we reviewed the importance of strengthening exercises and goals.  Pharmacotherapy We discussed various medication options to help [name] with her weight loss efforts and we both agreed to : continue current anti-obesity medication regimen.   Next refill will be sent to a different pharmacy to see if she could benefit from discount card.  She was also referred to company website for more information.  ASSOCIATED CONDITIONS ADDRESSED TODAY  Morbid obesity (HCC) with starting BMI 37 Assessment & Plan: Patient has lost approximately 40 pounds close to 20% of total body weight.  Her protein to adipose tissue loss ratio is less than 20%, so she is doing a good job preserving muscle mass.  We again emphasized the importance of getting approximately 30 to 40 g of protein per meal and shooting for a daily total of 90 g.  Also participating in strengthening activities at least 2-3 times a week.  She is on Zepbound and will be increasing to 7.5 mg.  Patient counseled today on eliminating internal biases about the use of antiobesity medication.  We also discussed maintaining lowest effective dose.  She will continue to track and journal to maintain a reduced calorie state of about 1200 cal a day.  Her calculated BMR is 1483 so this will result in gradual and sustainable weight loss.   Essential hypertension Assessment & Plan: Blood pressure is at goal for age and comorbid conditions.  Her regimen includes atenolol, ARB, hydrochlorothiazide and amlodipine.  Atenolol may contribute to weight gain through alterations in metabolism.  I discussed with her to review this with primary care team.  If she needs a beta-blocker they could try carvedilol or Bystolic which are weight neutral.  Continue current medications monitor for orthostasis as she continues to lose weight.   OSA on CPAP Assessment & Plan: Patient reports good adherence with CPAP therapy.  She has lost 20% of total body weight and may benefit from a repeat sleep study to see if she still needs CPAP.  There are no reports of leaks at there is probably been some changes in her facial contours.  Continue PAP therapy for now.     PHYSICAL EXAM:  Blood pressure 132/76, pulse 67,  temperature 98.1 F (36.7 C), height 5\' 6"  (1.676 m), weight 186 lb (84.4 kg), SpO2 99%. Body mass index is 30.02 kg/m.  General: She is overweight, cooperative, alert, well developed, and in no acute distress. PSYCH: Has normal mood, affect and thought process.   HEENT: EOMI, sclerae are anicteric. Lungs: Normal breathing effort, no conversational dyspnea. Extremities: No edema.  Neurologic: No gross sensory or motor deficits. No tremors or fasciculations noted.    DIAGNOSTIC DATA REVIEWED:  BMET    Component Value Date/Time   NA 136 11/21/2022 0918   NA 140 04/09/2018 1126   K 4.0 11/21/2022 0918   CL 101 11/21/2022 0918   CO2 27 11/21/2022 0918   GLUCOSE 91 11/21/2022 0918   BUN 18 11/21/2022 0918   BUN 22 04/09/2018 1126   CREATININE 0.60 11/21/2022 0918   CALCIUM 9.5 11/21/2022 0918   GFRNONAA 94 04/09/2018 1126   GFRAA 108 04/09/2018 1126   Lab Results  Component Value Date   HGBA1C 5.2 11/21/2022   HGBA1C 5.6 10/07/2020   No results found for: "INSULIN" Lab Results  Component Value Date   TSH 1.990 12/23/2021   CBC  Component Value Date/Time   WBC 5.9 08/16/2021 1503   RBC 4.50 08/16/2021 1503   HGB 13.9 08/16/2021 1503   HGB 14.1 09/28/2017 1557   HCT 41.6 08/16/2021 1503   HCT 41.8 09/28/2017 1557   PLT 258.0 08/16/2021 1503   PLT 234 09/28/2017 1557   MCV 92.4 08/16/2021 1503   MCV 91 09/28/2017 1557   MCH 30.5 09/28/2017 1557   MCHC 33.5 08/16/2021 1503   RDW 13.8 08/16/2021 1503   RDW 14.6 09/28/2017 1557   Iron Studies No results found for: "IRON", "TIBC", "FERRITIN", "IRONPCTSAT" Lipid Panel     Component Value Date/Time   CHOL 201 (H) 11/21/2022 0918   CHOL 177 04/09/2018 1124   TRIG 150.0 (H) 11/21/2022 0918   TRIG 83 04/09/2018 1124   HDL 62.00 11/21/2022 0918   HDL 109 09/28/2017 1557   CHOLHDL 3 11/21/2022 0918   VLDL 30.0 11/21/2022 0918   VLDL 17 04/09/2018 1124   LDLCALC 109 (H) 11/21/2022 0918   LDLCALC 110 (H)  09/28/2017 1557   Hepatic Function Panel     Component Value Date/Time   PROT 7.2 12/21/2022 0921   PROT 7.0 09/28/2017 1557   ALBUMIN 4.2 12/21/2022 0921   ALBUMIN 4.7 09/28/2017 1557   AST 15 12/21/2022 0921   AST 28 04/09/2018 1124   ALT 13 12/21/2022 0921   ALT 29 04/09/2018 1124   ALKPHOS 101 12/21/2022 0921   BILITOT 0.6 12/21/2022 0921   BILITOT 0.4 09/28/2017 1557   BILIDIR 0.2 12/21/2022 0921      Component Value Date/Time   TSH 1.990 12/23/2021 1054   Nutritional Lab Results  Component Value Date   VD25OH 24.2 (L) 12/23/2021     Return in about 3 weeks (around 01/12/2023) for For Weight Mangement with Dr. Rikki Spearing.Marland Kitchen She was informed of the importance of frequent follow up visits to maximize her success with intensive lifestyle modifications for her multiple health conditions.   ATTESTASTION STATEMENTS:  Reviewed by clinician on day of visit: allergies, medications, problem list, medical history, surgical history, family history, social history, and previous encounter notes.     Worthy Rancher, MD

## 2022-12-22 NOTE — Assessment & Plan Note (Signed)
Blood pressure is at goal for age and comorbid conditions.  Her regimen includes atenolol, ARB, hydrochlorothiazide and amlodipine.  Atenolol may contribute to weight gain through alterations in metabolism.  I discussed with her to review this with primary care team.  If she needs a beta-blocker they could try carvedilol or Bystolic which are weight neutral.  Continue current medications monitor for orthostasis as she continues to lose weight.

## 2022-12-26 ENCOUNTER — Ambulatory Visit (INDEPENDENT_AMBULATORY_CARE_PROVIDER_SITE_OTHER): Payer: Medicare Other | Admitting: Internal Medicine

## 2023-01-08 ENCOUNTER — Ambulatory Visit
Admission: EM | Admit: 2023-01-08 | Discharge: 2023-01-08 | Disposition: A | Discharge status: Home / Self Care | Payer: Medicare Other | Attending: Emergency Medicine | Admitting: Emergency Medicine

## 2023-01-08 DIAGNOSIS — B029 Zoster without complications: Secondary | ICD-10-CM

## 2023-01-08 MED ORDER — VALACYCLOVIR HCL 1 G PO TABS: 1000.0000 mg | ORAL_TABLET | Freq: Three times a day (TID) | ORAL | 0 refills | Status: AC

## 2023-01-08 NOTE — ED Triage Notes (Signed)
Pt presents with painful rash under right breast that goes to her back that started Friday. Pt describes the pain as a burning pain.

## 2023-01-08 NOTE — ED Provider Notes (Signed)
Sherry Davila    CSN: [id number] Arrival date & time: 01/08/23  1227      History   Chief Complaint Chief Complaint  Patient presents with   Rash    HPI Sherry Davila is a 77 y.o. female.   Patient presents for evaluation of a erythematous painful rash present underneath the right breast wrapping around to the right side of the back beginning 1 day ago.  Believes to be shingles.  Has not attempted treatment.  Believes that she is vaccinated.  Denies drainage or fever.  Denies changes in toiletries, diet or recent travel.  No other member of household has similar symptoms  Past Medical History:  Diagnosis Date   Actinic keratosis 04/04/2018   L nose lat to supra tip   Anxiety    Basal cell carcinoma 04/04/2018   R prox nasal ala   Depression    Dysplastic nevus 05/07/2013   R abdomen - mod to severe, excision 05/29/2013   Hyperlipidemia    Hypertension    Obesity    Sleep apnea    has a cpap, doesn't wear    Varicose vein     Patient Active Problem List   Diagnosis Date Noted   Polyphagia 11/29/2022   Elevated alkaline phosphatase level 11/23/2022   COVID-19 11/17/2022   Transient total loss of muscle tone 10/18/2022   Allergic conjunctivitis 09/19/2022   Morbid obesity (HCC) with starting BMI 37 07/12/2022   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   Back pain 04/26/2022   Chronic back pain 03/02/2022   Essential hypertension 01/06/2022   OSA on CPAP 01/06/2022   Depression 01/06/2022   Vitamin D deficiency 12/27/2021   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   Low back pain 08/13/2021   Major depressive disorder, single episode, 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   Leg pain, bilateral 07/06/2021   Left hip pain 04/01/2021   Hyperglycemia 08/30/2020   Acid reflux 10/13/2019   Chest pain 10/11/2019   Healthcare maintenance 10/01/2019   Constipation 10/28/2018   Personal history of other malignant neoplasm of skin 08/06/2018   Atrophic vaginitis 01/19/2015    Hyperlipidemia    Anxiety    Depression, major, single episode, mild (HCC)     Past Surgical History:  Procedure Laterality Date   COLONOSCOPY WITH PROPOFOL N/A 09/08/2016   Procedure: COLONOSCOPY WITH PROPOFOL;  Surgeon: Wyline Mood, MD;  Location: [facility] ENDOSCOPY;  Service: Endoscopy;  Laterality: N/A;   COLONOSCOPY WITH PROPOFOL N/A 01/07/2020   Procedure: COLONOSCOPY WITH PROPOFOL;  Surgeon: Wyline Mood, MD;  Location: [facility] ENDOSCOPY;  Service: Gastroenterology;  Laterality: N/A;   FRACTURE SURGERY     foot, screws placed   JOINT REPLACEMENT Right 2009   knee    OB History   No obstetric history on file.      Home Medications    Prior to Admission medications   Medication Sig Start Date End Date Taking? Authorizing Provider  amLODipine (NORVASC) 10 MG tablet TAKE 1 TABLET BY MOUTH DAILY 11/01/22  Yes Dale Ravenswood, MD  atenolol (TENORMIN) 25 MG tablet Take 1 tablet (25 mg total) by mouth daily. 03/24/22  Yes Agbor-Etang, Arlys John, MD  ezetimibe (ZETIA) 10 MG tablet Take 1 tablet (10 mg total) by mouth daily. 11/23/22  Yes Dale Stonewall, MD  FLUoxetine (PROZAC) 40 MG capsule Take 1 capsule (40 mg total) by mouth daily. 05/05/22  Yes Dale Montezuma, MD  losartan-hydrochlorothiazide (HYZAAR) 100-12.5 MG tablet Take 1 tablet by mouth daily. 05/05/22  Yes Dale Hancock,  MD  tirzepatide (ZEPBOUND) 7.5 MG/0.5ML Pen Inject 7.5 mg into the skin once a week. 12/12/22  Yes Dale Vowinckel, MD  valACYclovir (VALTREX) 1000 MG tablet Take 1 tablet (1,000 mg total) by mouth 3 (three) times daily for 7 days. 01/08/23 01/15/23 Yes Takeia Ciaravino, Elita Boone, NP    Family History Family History  Problem Relation Age of Onset   Cancer Mother    Heart failure Mother    Heart disease Mother    Breast cancer Mother 24   Heart attack Mother    Obesity Father    Depression Father    Early death Father    Hyperlipidemia Father    AAA (abdominal aortic aneurysm) Father    Hypertension Father    Heart  disease Father    Hypertension Sister     Social History Social History   Tobacco Use   Smoking status: Former    Current packs/day: 0.00    Types: Cigarettes    Quit date: 01/19/1988    Years since quitting: 34.9   Smokeless tobacco: Never  Vaping Use   Vaping status: Never Used  Substance Use Topics   Alcohol use: Yes    Alcohol/week: 3.0 standard drinks of alcohol    Types: 3 Shots of liquor per week    Comment: 3 drinks a week socially    Drug use: No     Allergies   Patient has no known allergies.   Review of Systems Review of Systems  Constitutional: Negative.   Respiratory: Negative.    Cardiovascular: Negative.   Skin:  Positive for rash. Negative for color change, pallor and wound.  Neurological: Negative.      Physical Exam Triage Vital Signs ED Triage Vitals  Encounter Vitals Group     BP 01/08/23 1243 (!) 149/79     Systolic BP Percentile --      Diastolic BP Percentile --      Pulse Rate 01/08/23 1243 66     Resp 01/08/23 1243 16     Temp 01/08/23 1243 98.3 F (36.8 C)     Temp Source 01/08/23 1243 Oral     SpO2 01/08/23 1243 98 %     Weight --      Height --      Head Circumference --      Peak Flow --      Pain Score 01/08/23 1244 6     Pain Loc --      Pain Education --      Exclude from Growth Chart --    No data found.  Updated Vital Signs BP (!) 149/79 (BP Location: Left Arm)   Pulse 66   Temp 98.3 F (36.8 C) (Oral)   Resp 16   SpO2 98%   Visual Acuity Right Eye Distance:   Left Eye Distance:   Bilateral Distance:    Right Eye Near:   Left Eye Near:    Bilateral Near:     Physical Exam Constitutional:      Appearance: Normal appearance.  Eyes:     Extraocular Movements: Extraocular movements intact.  Pulmonary:     Effort: Pulmonary effort is normal.  Skin:    Comments: Erythematous cluster of blisters present underneath the right breast along the bra line, erythematous cluster of blisters present to the center  of the right thoracic region of the back  Neurological:     Mental Status: She is alert and oriented to person, place, and time. Mental status  is at baseline.      UC Treatments / Results  Labs (all labs ordered are listed, but only abnormal results are displayed) Labs Reviewed - No data to display  EKG   Radiology No results found.  Procedures Procedures (including critical care time)  Medications Ordered in UC Medications - No data to display  Initial Impression / Assessment and Plan / UC Course  I have reviewed the triage vital signs and the nursing notes.  Pertinent labs & imaging results that were available during my care of the patient were reviewed by me and considered in my medical decision making (see chart for details).  Herpes  zoster without complication  Presentation of rash is consistent with above diagnosis, discussed with patient, valacyclovir sent to pharmacy and discussed administration, reviewed supportive measures and given written handout, advised follow-up with urgent care or primary doctor for any concerns regarding healing Final Clinical Impressions(s) / UC Diagnoses   Final diagnoses:  Herpes zoster without complication     Discharge Instructions      Today you are being treated for herpes zoster commonly known as shingles, presentation of rash is consistent with this common illness, more information inside your packet  You are considered contagious until rash has completely blistered over therefore please keep covered at all times with clothing ideally want to wear looser clothing until rash is healed to prevent friction which will cause irritation  Begin use of valacyclovir taking every 8 hours for the next 7 days which will suppress the amount of virus in your body which ideally will help this heal faster  Rash typically causes either burning or itching and focus of treatment is helping to keep you comfortable  You may apply ice using a  ice pack or compress over the affected area in 10 to 15-minute intervals  You may apply topical product such as Benadryl, calamine lotion, lidocaine etc. over the rash for comfort as needed  Avoid scratching, any breaks in the skin will put you at risk for secondary bacterial infection, pat as needed  If you have any further concerns you may follow-up with his urgent care or your primary doctor as needed   ED Prescriptions     Medication Sig Dispense Auth. Provider   valACYclovir (VALTREX) 1000 MG tablet Take 1 tablet (1,000 mg total) by mouth 3 (three) times daily for 7 days. 21 tablet Macallister Ashmead, Elita Boone, NP      PDMP not reviewed this encounter.   Valinda Hoar, NP 01/08/23 1318

## 2023-01-08 NOTE — Discharge Instructions (Signed)
Today you are being treated for herpes zoster commonly known as shingles, presentation of rash is consistent with this common illness, more information inside your packet  You are considered contagious until rash has completely blistered over therefore please keep covered at all times with clothing ideally want to wear looser clothing until rash is healed to prevent friction which will cause irritation  Begin use of valacyclovir taking every 8 hours for the next 7 days which will suppress the amount of virus in your body which ideally will help this heal faster  Rash typically causes either burning or itching and focus of treatment is helping to keep you comfortable  You may apply ice using a ice pack or compress over the affected area in 10 to 15-minute intervals  You may apply topical product such as Benadryl, calamine lotion, lidocaine etc. over the rash for comfort as needed  Avoid scratching, any breaks in the skin will put you at risk for secondary bacterial infection, pat as needed  If you have any further concerns you may follow-up with his urgent care or your primary doctor as needed

## 2023-01-16 ENCOUNTER — Ambulatory Visit (INDEPENDENT_AMBULATORY_CARE_PROVIDER_SITE_OTHER): Payer: Medicare Other | Admitting: Internal Medicine

## 2023-01-16 ENCOUNTER — Encounter (INDEPENDENT_AMBULATORY_CARE_PROVIDER_SITE_OTHER): Payer: Self-pay | Admitting: Internal Medicine

## 2023-01-16 DIAGNOSIS — Z6830 Body mass index (BMI) 30.0-30.9, adult: Secondary | ICD-10-CM | POA: Diagnosis not present

## 2023-01-16 DIAGNOSIS — G4733 Obstructive sleep apnea (adult) (pediatric): Secondary | ICD-10-CM | POA: Diagnosis not present

## 2023-01-16 DIAGNOSIS — I10 Essential (primary) hypertension: Secondary | ICD-10-CM

## 2023-01-16 NOTE — Progress Notes (Signed)
error 

## 2023-01-16 NOTE — Progress Notes (Unsigned)
Office: [phone]  /  Fax: [phone]  WEIGHT SUMMARY AND BIOMETRICS  Vitals Temp: 98.4 F (36.9 C) BP: 134/84 Pulse Rate: 70 SpO2: 98 %   Anthropometric Measurements Height: 5\' 6"  (1.676 m) Weight: 186 lb (84.4 kg) BMI (Calculated): 30.04 Weight at Last Visit: 186 lb Weight Lost Since Last Visit: 0 lb Weight Gained Since Last Visit: 0 lb Starting Weight: 224 lb Total Weight Loss (lbs): 38 lb (17.2 kg)   Body Composition  Body Fat %: 43.9 % Fat Mass (lbs): 81.6 lbs Muscle Mass (lbs): 99.2 lbs Total Body Water (lbs): 75.4 lbs Visceral Fat Rating : 13    No data recorded Today's Visit #: 11  Starting Date: 12/23/21   HPI  Chief Complaint: OBESITY  Sherry Davila is here to discuss her progress with her obesity treatment plan. She is on the keeping a food journal and adhering to recommended goals of 1200 calories and 80 protein and states she is following her eating plan approximately 60 % of the time. She states she is exercising 60 minutes 2-3 times per week.  Interval History:   This is my first encounter with Sherry Davila.  She is a former patient of Dr. Cathey Endow who will be transitioning to me due to changes in provider location.  Her peak weight was 232 pounds she is currently following a reduced calorie nutrition plan targeting 1200 cal a day and about 80 to 90 g of protein daily.  She is tracking and journaling she is also exercising at the gym.  She is currently on tirzepatide 5 mg once a week and has been increased to 7.5 mg. She has been paying for medication out-of-pocket.   Since last office visit she has maintained.  She acknowledges some celebrations over the last few weeks. She reports variable adherence to reduced calorie nutritional plan She has been working on not skipping meals, increasing protein intake at every meal, drinking more water, and avoiding and / or reducing liquid calories  Orixegenic Control: Denies problems with appetite and hunger  signals.  Denies problems with satiety and satiation.  Denies problems with eating patterns and portion control.  Denies abnormal cravings. Denies feeling deprived or restricted.   Barriers identified: cost of medication.   Pharmacotherapy for weight loss: She is currently taking Zepbound with adequate clinical response  and without side effects..    ASSESSMENT AND PLAN  TREATMENT PLAN FOR OBESITY:  Recommended Dietary Goals  Sherry Davila is currently in the action stage of change. As such, her goal is to continue weight management plan. She has agreed to: continue current plan  Behavioral Intervention  We discussed the following Behavioral Modification Strategies today: increasing lean protein intake, decreasing simple carbohydrates , increasing vegetables, increasing lower glycemic fruits, increasing water intake, continue to practice mindfulness when eating, and planning for success.  Additional resources provided today: None  Recommended Physical Activity Goals  Sherry Davila has been advised to work up to 150 minutes of moderate intensity aerobic activity a week and strengthening exercises 2-3 times per week for cardiovascular health, weight loss maintenance and preservation of muscle mass.   She has agreed to :  Counseled the patient on the importance of strengthening exercises for preservation of muscle mass and bone health.  We discussed home exercises watching YouTube videos.  Pharmacotherapy We discussed various medication options to help Sherry Davila with her weight loss efforts and we both agreed to : continue current anti-obesity medication regimen at current dose do not recommend further increases.  ASSOCIATED CONDITIONS ADDRESSED TODAY  Morbid obesity (HCC) with starting BMI 37 Assessment & Plan: Patient has lost approximately 40 pounds close to 20% of total body weight.  Her protein to adipose tissue loss ratio is less than 20%, so she is doing a good job preserving muscle mass.  We  again emphasized the importance of getting approximately 30 to 40 g of protein per meal and shooting for a daily total of 90 g.  Also participating in strengthening activities at least 2-3 times a week.  She is now on Zepbound 7.5 mg once a week.  This is being managed by her primary care team.  Patient advised to continue lowest effective dose.Marland Kitchen  She will continue to track and journal to maintain a reduced calorie state of about 1200 cal a day.  Her calculated BMR is 1483 so this will result in gradual and sustainable weight loss.   Essential hypertension Assessment & Plan: Blood pressure is at goal for age and comorbid conditions.  Her regimen includes atenolol, ARB, hydrochlorothiazide and amlodipine.  Atenolol may contribute to weight gain through alterations in metabolism.  I discussed with her to review this with primary care team.  If she needs a beta-blocker they could try carvedilol or Bystolic which are weight neutral.  Continue current medications monitor for orthostasis as she continues to lose weight.   OSA on CPAP Assessment & Plan: Patient reports good adherence with CPAP therapy.  She has lost 20% of total body weight and may benefit from a repeat sleep study to see if she still needs CPAP.  There are no reports of leaks at there is probably been some changes in her facial contours.  Continue PAP therapy for now.     PHYSICAL EXAM:  Blood pressure 134/84, pulse 70, temperature 98.4 F (36.9 C), height 5\' 6"  (1.676 m), weight 186 lb (84.4 kg), SpO2 98%. Body mass index is 30.02 kg/m.  General: She is overweight, cooperative, alert, well developed, and in no acute distress. PSYCH: Has normal mood, affect and thought process.   HEENT: EOMI, sclerae are anicteric. Lungs: Normal breathing effort, no conversational dyspnea. Extremities: No edema.  Neurologic: No gross sensory or motor deficits. No tremors or fasciculations noted.    DIAGNOSTIC DATA REVIEWED:  BMET     Component Value Date/Time   NA 136 11/21/2022 0918   NA 140 04/09/2018 1126   K 4.0 11/21/2022 0918   CL 101 11/21/2022 0918   CO2 27 11/21/2022 0918   GLUCOSE 91 11/21/2022 0918   BUN 18 11/21/2022 0918   BUN 22 04/09/2018 1126   CREATININE 0.60 11/21/2022 0918   CALCIUM 9.5 11/21/2022 0918   GFRNONAA 94 04/09/2018 1126   GFRAA 108 04/09/2018 1126   Lab Results  Component Value Date   HGBA1C 5.2 11/21/2022   HGBA1C 5.6 10/07/2020   No results found for: "INSULIN" Lab Results  Component Value Date   TSH 1.990 12/23/2021   CBC    Component Value Date/Time   WBC 5.9 08/16/2021 1503   RBC 4.50 08/16/2021 1503   HGB 13.9 08/16/2021 1503   HGB 14.1 09/28/2017 1557   HCT 41.6 08/16/2021 1503   HCT 41.8 09/28/2017 1557   PLT 258.0 08/16/2021 1503   PLT 234 09/28/2017 1557   MCV 92.4 08/16/2021 1503   MCV 91 09/28/2017 1557   MCH 30.5 09/28/2017 1557   MCHC 33.5 08/16/2021 1503   RDW 13.8 08/16/2021 1503   RDW 14.6 09/28/2017 1557  Iron Studies No results found for: "IRON", "TIBC", "FERRITIN", "IRONPCTSAT" Lipid Panel     Component Value Date/Time   CHOL 201 (H) 11/21/2022 0918   CHOL 177 04/09/2018 1124   TRIG 150.0 (H) 11/21/2022 0918   TRIG 83 04/09/2018 1124   HDL 62.00 11/21/2022 0918   HDL 109 09/28/2017 1557   CHOLHDL 3 11/21/2022 0918   VLDL 30.0 11/21/2022 0918   VLDL 17 04/09/2018 1124   LDLCALC 109 (H) 11/21/2022 0918   LDLCALC 110 (H) 09/28/2017 1557   Hepatic Function Panel     Component Value Date/Time   PROT 7.2 12/21/2022 0921   PROT 7.0 09/28/2017 1557   ALBUMIN 4.2 12/21/2022 0921   ALBUMIN 4.7 09/28/2017 1557   AST 15 12/21/2022 0921   AST 28 04/09/2018 1124   ALT 13 12/21/2022 0921   ALT 29 04/09/2018 1124   ALKPHOS 101 12/21/2022 0921   BILITOT 0.6 12/21/2022 0921   BILITOT 0.4 09/28/2017 1557   BILIDIR 0.2 12/21/2022 0921      Component Value Date/Time   TSH 1.990 12/23/2021 1054   Nutritional Lab Results  Component  Value Date   VD25OH 24.2 (L) 12/23/2021     Return for Week of Sept 30th - 5 weeks - with Dr. Rikki Spearing.Marland Kitchen She was informed of the importance of frequent follow up visits to maximize her success with intensive lifestyle modifications for her multiple health conditions.   ATTESTASTION STATEMENTS:  Reviewed by clinician on day of visit: allergies, medications, problem list, medical history, surgical history, family history, social history, and previous encounter notes.     Worthy Rancher, MD

## 2023-01-17 NOTE — Assessment & Plan Note (Signed)
 Patient reports good adherence with CPAP therapy.  She has lost 20% of total body weight and may benefit from a repeat sleep study to see if she still needs CPAP.  There are no reports of leaks at there is probably been some changes in her facial contours.  Continue PAP therapy for now.

## 2023-01-17 NOTE — Assessment & Plan Note (Signed)
 Blood pressure is at goal for age and comorbid conditions.  Her regimen includes atenolol, ARB, hydrochlorothiazide and amlodipine.  Atenolol may contribute to weight gain through alterations in metabolism.  I discussed with her to review this with primary care team.  If she needs a beta-blocker they could try carvedilol or Bystolic which are weight neutral.  Continue current medications monitor for orthostasis as she continues to lose weight.

## 2023-01-17 NOTE — Assessment & Plan Note (Signed)
Patient has lost approximately 40 pounds close to 20% of total body weight.  Her protein to adipose tissue loss ratio is less than 20%, so she is doing a good job preserving muscle mass.  We again emphasized the importance of getting approximately 30 to 40 g of protein per meal and shooting for a daily total of 90 g.  Also participating in strengthening activities at least 2-3 times a week.  She is now on Zepbound 7.5 mg once a week.  This is being managed by her primary care team.  Patient advised to continue lowest effective dose.Marland Kitchen  She will continue to track and journal to maintain a reduced calorie state of about 1200 cal a day.  Her calculated BMR is 1483 so this will result in gradual and sustainable weight loss.

## 2023-01-18 ENCOUNTER — Emergency Department
Admission: EM | Admit: 2023-01-18 | Discharge: 2023-01-18 | Disposition: A | Discharge status: Home / Self Care | Payer: Medicare Other | Attending: Emergency Medicine | Admitting: Emergency Medicine

## 2023-01-18 ENCOUNTER — Emergency Department: Payer: Medicare Other

## 2023-01-18 ENCOUNTER — Other Ambulatory Visit: Payer: Self-pay

## 2023-01-18 ENCOUNTER — Encounter: Payer: Self-pay | Admitting: Emergency Medicine

## 2023-01-18 ENCOUNTER — Ambulatory Visit
Admission: RE | Admit: 2023-01-18 | Discharge: 2023-01-18 | Disposition: A | Discharge status: Home / Self Care | Payer: Medicare Other | Source: Ambulatory Visit | Attending: Internal Medicine | Admitting: Internal Medicine

## 2023-01-18 VITALS — BP 135/86 | HR 74 | Temp 98.2°F | Resp 18

## 2023-01-18 DIAGNOSIS — B029 Zoster without complications: Secondary | ICD-10-CM

## 2023-01-18 DIAGNOSIS — I77819 Aortic ectasia, unspecified site: Secondary | ICD-10-CM | POA: Diagnosis not present

## 2023-01-18 DIAGNOSIS — R079 Chest pain, unspecified: Secondary | ICD-10-CM | POA: Insufficient documentation

## 2023-01-18 DIAGNOSIS — M549 Dorsalgia, unspecified: Secondary | ICD-10-CM | POA: Diagnosis not present

## 2023-01-18 DIAGNOSIS — R0789 Other chest pain: Secondary | ICD-10-CM | POA: Diagnosis not present

## 2023-01-18 DIAGNOSIS — I10 Essential (primary) hypertension: Secondary | ICD-10-CM | POA: Diagnosis not present

## 2023-01-18 DIAGNOSIS — R5381 Other malaise: Secondary | ICD-10-CM

## 2023-01-18 DIAGNOSIS — I771 Stricture of artery: Secondary | ICD-10-CM | POA: Diagnosis not present

## 2023-01-18 DIAGNOSIS — R0602 Shortness of breath: Secondary | ICD-10-CM

## 2023-01-18 LAB — BASIC METABOLIC PANEL
Anion gap: 6 (ref 5–15)
BUN: 16 mg/dL (ref 8–23)
CO2: 23 mmol/L (ref 22–32)
Calcium: 9 mg/dL (ref 8.9–10.3)
Chloride: 106 mmol/L (ref 98–111)
Creatinine, Ser: 0.49 mg/dL (ref 0.44–1.00)
GFR, Estimated: >60 mL/min (ref >60)
Glucose, Bld: 98 mg/dL (ref 70–99)
Potassium: 3.6 mmol/L (ref 3.5–5.1)
Sodium: 135 mmol/L (ref 135–145)

## 2023-01-18 LAB — CBC WITH DIFFERENTIAL/PLATELET
Abs Immature Granulocytes: 0.03 10*3/uL (ref 0.00–0.07)
Basophils Absolute: 0.1 10*3/uL (ref 0.0–0.1)
Basophils Relative: 1 %
Eosinophils Absolute: 0.3 10*3/uL (ref 0.0–0.5)
Eosinophils Relative: 5 %
HCT: 43.9 % (ref 36.0–46.0)
Hemoglobin: 14.2 g/dL (ref 12.0–15.0)
Immature Granulocytes: 1 %
Lymphocytes Relative: 37 %
Lymphs Abs: 2.2 10*3/uL (ref 0.7–4.0)
MCH: 29.5 pg (ref 26.0–34.0)
MCHC: 32.3 g/dL (ref 30.0–36.0)
MCV: 91.1 fL (ref 80.0–100.0)
Monocytes Absolute: 0.5 10*3/uL (ref 0.1–1.0)
Monocytes Relative: 9 %
Neutro Abs: 2.9 10*3/uL (ref 1.7–7.7)
Neutrophils Relative %: 47 %
Platelets: 260 10*3/uL (ref 150–400)
RBC: 4.82 MIL/uL (ref 3.87–5.11)
RDW: 14.3 % (ref 11.5–15.5)
WBC: 6 10*3/uL (ref 4.0–10.5)
nRBC: 0 % (ref 0.0–0.2)

## 2023-01-18 LAB — TROPONIN I (HIGH SENSITIVITY): Troponin I (High Sensitivity): 4 ng/L (ref <18)

## 2023-01-18 NOTE — ED Triage Notes (Signed)
Pt to ED via POV.  Pt was sent from urgent care for shortness of breath since this morning and right shoulder pain that started throughout the night. Pt denies chest pain. Pt states that she has had back pain. Pt recently had shingles but rash has healed.

## 2023-01-18 NOTE — ED Notes (Signed)
Patient is being discharged from the Urgent Care and sent to the Emergency Department via POV . Per Wendee Beavers NP, patient is in need of higher level of care due to chest pain, SHOB, malaise. Patient is aware and verbalizes understanding of plan of care.  Vitals:   01/18/23 0904  BP: 135/86  Pulse: 74  Resp: 18  Temp: 98.2 F (36.8 C)  SpO2: 99%

## 2023-01-18 NOTE — ED Provider Notes (Signed)
Sherry Davila    CSN: [id number] Arrival date & time: 01/18/23  0849      History   Chief Complaint Chief Complaint  Patient presents with   Generalized Body Aches    Skin prickles and hurts, trouble  breathing - Entered by patient    HPI Sherry Davila is a 77 y.o. female.  Patient presents with mild shortness of breath, right upper chest tightness, right upper back pain since this morning.  Her symptoms are worse with deep breaths.  She also has skin prickling and burning on her right upper arm and across her abdomen.  She has a healing herpes zoster rash on her left flank.  She completed course of Valtrex for shingles.  She denies current chest pain or shortness of breath.  She denies fever, chills, new rash, nausea, vomiting, diaphoresis, weakness, numbness, or other symptoms.  Patient was seen at this urgent care on 01/08/2023; diagnosed with herpes zoster; treated with Valtrex.  Her medical history includes hypertension, hyperlipidemia, obesity.  The history is provided by the patient and medical records.    Past Medical History:  Diagnosis Date   Actinic keratosis 04/04/2018   L nose lat to supra tip   Anxiety    Basal cell carcinoma 04/04/2018   R prox nasal ala   Depression    Dysplastic nevus 05/07/2013   R abdomen - mod to severe, excision 05/29/2013   Hyperlipidemia    Hypertension    Obesity    Sleep apnea    has a cpap, doesn't wear    Varicose vein     Patient Active Problem List   Diagnosis Date Noted   Polyphagia 11/29/2022   Elevated alkaline phosphatase level 11/23/2022   COVID-19 11/17/2022   Transient total loss of muscle tone 10/18/2022   Allergic conjunctivitis 09/19/2022   Morbid obesity (HCC) with starting BMI 37 07/12/2022   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   Back pain 04/26/2022   Chronic back pain 03/02/2022   Essential hypertension 01/06/2022   OSA on CPAP 01/06/2022   Depression 01/06/2022   Vitamin D deficiency 12/27/2021    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   Low back pain 08/13/2021   Major depressive disorder, single episode, 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   Leg pain, bilateral 07/06/2021   Left hip pain 04/01/2021   Hyperglycemia 08/30/2020   Acid reflux 10/13/2019   Chest pain 10/11/2019   Healthcare maintenance 10/01/2019   Constipation 10/28/2018   Personal history of other malignant neoplasm of skin 08/06/2018   Atrophic vaginitis 01/19/2015   Hyperlipidemia    Anxiety    Depression, major, single episode, mild (HCC)     Past Surgical History:  Procedure Laterality Date   COLONOSCOPY WITH PROPOFOL N/A 09/08/2016   Procedure: COLONOSCOPY WITH PROPOFOL;  Surgeon: Wyline Mood, MD;  Location: [facility] ENDOSCOPY;  Service: Endoscopy;  Laterality: N/A;   COLONOSCOPY WITH PROPOFOL N/A 01/07/2020   Procedure: COLONOSCOPY WITH PROPOFOL;  Surgeon: Wyline Mood, MD;  Location: [facility] ENDOSCOPY;  Service: Gastroenterology;  Laterality: N/A;   FRACTURE SURGERY     foot, screws placed   JOINT REPLACEMENT Right 2009   knee    OB History   No obstetric history on file.      Home Medications    Prior to Admission medications   Medication Sig Start Date End Date Taking? Authorizing Provider  amLODipine (NORVASC) 10 MG tablet TAKE 1 TABLET BY MOUTH DAILY 11/01/22   Dale Golconda, MD  atenolol (TENORMIN) 25 MG  tablet Take 1 tablet (25 mg total) by mouth daily. 03/24/22   Debbe Odea, MD  ezetimibe (ZETIA) 10 MG tablet Take 1 tablet (10 mg total) by mouth daily. 11/23/22   Dale Revere, MD  FLUoxetine (PROZAC) 40 MG capsule Take 1 capsule (40 mg total) by mouth daily. 05/05/22   Dale Hornbrook, MD  losartan-hydrochlorothiazide (HYZAAR) 100-12.5 MG tablet Take 1 tablet by mouth daily. 05/05/22   Dale [place], MD  tirzepatide (ZEPBOUND) 7.5 MG/0.5ML Pen Inject 7.5 mg into the skin once a week. 12/12/22   Dale [place], MD    Family History Family History  Problem Relation Age of Onset    Cancer Mother    Heart failure Mother    Heart disease Mother    Breast cancer Mother 70   Heart attack Mother    Obesity Father    Depression Father    Early death Father    Hyperlipidemia Father    AAA (abdominal aortic aneurysm) Father    Hypertension Father    Heart disease Father    Hypertension Sister     Social History Social History   Tobacco Use   Smoking status: Former    Current packs/day: 0.00    Types: Cigarettes    Quit date: 01/19/1988    Years since quitting: 35.0   Smokeless tobacco: Never  Vaping Use   Vaping status: Never Used  Substance Use Topics   Alcohol use: Yes    Alcohol/week: 3.0 standard drinks of alcohol    Types: 3 Shots of liquor per week    Comment: 3 drinks a week socially    Drug use: No     Allergies   Patient has no known allergies.   Review of Systems Review of Systems  Constitutional:  Positive for fatigue. Negative for chills, diaphoresis and fever.  Respiratory:  Positive for shortness of breath. Negative for cough.   Cardiovascular:  Positive for chest pain. Negative for palpitations.  Gastrointestinal:  Negative for abdominal pain, nausea and vomiting.  Musculoskeletal:  Positive for back pain.  Skin:  Positive for rash.  Neurological:  Negative for weakness and numbness.     Physical Exam Triage Vital Signs ED Triage Vitals  Encounter Vitals Group     BP 01/18/23 0904 135/86     Systolic BP Percentile --      Diastolic BP Percentile --      Pulse Rate 01/18/23 0904 74     Resp 01/18/23 0904 18     Temp 01/18/23 0904 98.2 F (36.8 C)     Temp src --      SpO2 01/18/23 0904 99 %     Weight --      Height --      Head Circumference --      Peak Flow --      Pain Score 01/18/23 0901 7     Pain Loc --      Pain Education --      Exclude from Growth Chart --    No data found.  Updated Vital Signs BP 135/86   Pulse 74   Temp 98.2 F (36.8 C)   Resp 18   SpO2 99%   Visual Acuity Right Eye Distance:    Left Eye Distance:   Bilateral Distance:    Right Eye Near:   Left Eye Near:    Bilateral Near:     Physical Exam Vitals and nursing note reviewed.  Constitutional:  General: She is not in acute distress.    Appearance: She is well-developed.  HENT:     Mouth/Throat:     Mouth: Mucous membranes are moist.  Cardiovascular:     Rate and Rhythm: Normal rate and regular rhythm.     Heart sounds: Normal heart sounds.  Pulmonary:     Effort: Pulmonary effort is normal. No respiratory distress.     Breath sounds: Normal breath sounds.  Musculoskeletal:     Cervical back: Neck supple.  Skin:    General: Skin is warm and dry.     Findings: Rash present.     Comments: Rash along left flank and abdomen.  Neurological:     Mental Status: She is alert.  Psychiatric:        Mood and Affect: Mood normal.        Behavior: Behavior normal.      UC Treatments / Results  Labs (all labs ordered are listed, but only abnormal results are displayed) Labs Reviewed - No data to display  EKG   Radiology No results found.  Procedures Procedures (including critical care time)  Medications Ordered in UC Medications - No data to display  Initial Impression / Assessment and Plan / UC Course  I have reviewed the triage vital signs and the nursing notes.  Pertinent labs & imaging results that were available during my care of the patient were reviewed by me and considered in my medical decision making (see chart for details).   Shortness of breath, chest pain, upper back pain, malaise, herpes zoster.  Patient presents with shortness of breath, right upper chest pain, right upper back pain this morning.  Her symptoms have improved.  She has generalized malaise and recently completed Valtrex for herpes zoster.  Her shingles rash is improving.  Afebrile and vital signs are stable.  Given her symptoms, sending her to the ED for evaluation.  She is agreeable to this and feels stable to drive  herself to the ED.   Final Clinical Impressions(s) / UC Diagnoses   Final diagnoses:  Shortness of breath  Chest pain, unspecified type  Upper back pain  Malaise  Herpes zoster without complication     Discharge Instructions      Go to the emergency department for evaluation of your shortness of breath, chest pain, upper back pain, malaise, shingles.     ED Prescriptions   None    I have reviewed the PDMP during this encounter.   Mickie Bail, NP 01/18/23 [phone]

## 2023-01-18 NOTE — ED Provider Notes (Signed)
[facility] Provider Note    Event Date/Time   First MD Initiated Contact with Patient 01/18/23 1025     (approximate)  History   Chief Complaint: Shortness of Breath and Shoulder Pain  HPI  Sherry Davila is a 77 y.o. female with a past medical history of hypertension, hyperlipidemia presents to the emergency department for right chest/shoulder discomfort.  According to the patient approximately 10 days ago she was diagnosed with herpes zoster.  States she has been having some discomfort to this area ever since.  Patient states she has noted some increased discomfort at times to her right shoulder and right chest as well as a sensation of shortness of breath at times.  Patient went to urgent care today as a precaution as she is traveling to [place] next week and wanted to make sure she was okay to go.  They sent her to the emergency department for further evaluation.  Here the patient appears well denies any pain currently.  Denies any cough.  Denies any leg pain or swelling.  Denies any pleuritic nature to the pain.  Physical Exam   Triage Vital Signs: ED Triage Vitals  Encounter Vitals Group     BP 01/18/23 0956 (!) 158/95     Systolic BP Percentile --      Diastolic BP Percentile --      Pulse Rate 01/18/23 0956 73     Resp 01/18/23 0956 16     Temp 01/18/23 0956 98.3 F (36.8 C)     Temp Source 01/18/23 0956 Oral     SpO2 01/18/23 0956 100 %     Weight 01/18/23 0957 187 lb (84.8 kg)     Height 01/18/23 0957 5\' 6"  (1.676 m)     Head Circumference --      Peak Flow --      Pain Score 01/18/23 0957 4     Pain Loc --      Pain Education --      Exclude from Growth Chart --     Most recent vital signs: Vitals:   01/18/23 0956  BP: (!) 158/95  Pulse: 73  Resp: 16  Temp: 98.3 F (36.8 C)  SpO2: 100%    General: Awake, no distress.  CV:  Good peripheral perfusion.  Regular rate and rhythm  Resp:  Normal effort.  Equal breath sounds  bilaterally.  Abd:  No distention.  Soft, nontender.  No rebound or guarding. Other:  No lower extreme edema or tenderness.   ED Results / Procedures / Treatments   EKG  EKG viewed and interpreted by myself shows a normal sinus rhythm at 69 bpm with a narrow QRS, left axis deviation, largely normal intervals with no concerning ST changes.  RADIOLOGY  I have reviewed and interpreted the chest x-ray images.  No consolidation on my evaluation. Radiology is read the x-ray is negative.   MEDICATIONS ORDERED IN ED: Medications - No data to display   IMPRESSION / MDM / ASSESSMENT AND PLAN / ED COURSE  I reviewed the triage vital signs and the nursing notes.  Patient's presentation is most consistent with acute presentation with potential threat to life or bodily function.  Patient presents to the emergency department for intermittent right shoulder/chest discomfort/tightness.  Patient is currently recovering from shingles to the right chest.  Highly suspect this shingles is what is leading to the patient's discomfort.  There is no leg pain no swelling no pleuritic nature to  the pain no history of blood clot.  No history of cardiac disease.  Vital signs are reassuring including pulse rate respiratory rate 100% room air saturation.  Patient's chest x-ray is clear and EKG is reassuring CBC is normal chemistry shows no concerning findings and troponin is reassuringly negative as well.  Given the patient's reassuring workup with symptoms that could be explained by the patient's current shingles outbreak I believe the patient is safe for discharge home.  Discussed keeping the rash covered but otherwise is safe for travel.  Patient will follow-up with her doctor.  FINAL CLINICAL IMPRESSION(S) / ED DIAGNOSES   Chest pain Herpes zoster  Note:  This document was prepared using Dragon voice recognition software and may include unintentional dictation errors.   Minna Antis, MD 01/18/23  1100

## 2023-01-18 NOTE — ED Triage Notes (Addendum)
Patient to Urgent Care with complaints of generalized body aches. Describes soreness on her right side (arm, chest, shoulder, back, neck) after shingles that started 8/16. Pain worst when taking a deep breath. Describes pain as prickling and burning. Areas tend to touch.  Rash blistered over. Finished valacyclovir as prescribed. Denies any other symptoms.

## 2023-01-18 NOTE — Discharge Instructions (Signed)
Go to the emergency department for evaluation of your shortness of breath, chest pain, upper back pain, malaise, shingles.

## 2023-01-23 ENCOUNTER — Encounter: Payer: Self-pay | Admitting: Internal Medicine

## 2023-01-24 ENCOUNTER — Encounter: Payer: Self-pay | Admitting: Internal Medicine

## 2023-01-24 ENCOUNTER — Ambulatory Visit (INDEPENDENT_AMBULATORY_CARE_PROVIDER_SITE_OTHER): Payer: Medicare Other | Admitting: Internal Medicine

## 2023-01-24 VITALS — BP 134/72 | HR 70 | Temp 98.6°F | Resp 16 | Ht 66.0 in | Wt 187.2 lb

## 2023-01-24 DIAGNOSIS — G4733 Obstructive sleep apnea (adult) (pediatric): Secondary | ICD-10-CM

## 2023-01-24 DIAGNOSIS — I10 Essential (primary) hypertension: Secondary | ICD-10-CM | POA: Diagnosis not present

## 2023-01-24 DIAGNOSIS — F320 Major depressive disorder, single episode, mild: Secondary | ICD-10-CM

## 2023-01-24 DIAGNOSIS — B029 Zoster without complications: Secondary | ICD-10-CM

## 2023-01-24 MED ORDER — ZEPBOUND 7.5 MG/0.5ML ~~LOC~~ SOAJ
7.5000 mg | SUBCUTANEOUS | 0 refills | Status: AC
Start: 2023-01-24 — End: ?

## 2023-01-24 MED ORDER — GABAPENTIN 100 MG PO CAPS: 100.0000 mg | ORAL_CAPSULE | Freq: Three times a day (TID) | ORAL | 1 refills | Status: DC

## 2023-01-24 NOTE — Telephone Encounter (Signed)
LM for patient. Trying to schedule appt at 1:30 today with Dr Lorin Picket.

## 2023-01-24 NOTE — Progress Notes (Signed)
Subjective:    Patient ID: Sherry Davila, female    DOB: 1946/04/13, 77 y.o.   MRN: [id number]  Patient here for  Chief Complaint  Patient presents with   nerve pain    HPI Here as a work in appt.  Work in - persistent shingles pain. Was evaluated 01/08/23 - painful rash under right breast wrapping around to the right side of her back.  Diagnosed with shingles.  Treated with valtrex.  Completed.  Taking tylenol and ibuprofen. Still with increased pain.  Lesions are better.  Residual skin changes. Pain extending up her right side.  Increased sensitivity to touch.  Feels needs something stronger.  Planning a trip out of the country this week.  Wanted to get something to help prior to leaving. No fever.  Eating.  No vomiting.  No sob.    Past Medical History:  Diagnosis Date   Actinic keratosis 04/04/2018   L nose lat to supra tip   Anxiety    Basal cell carcinoma 04/04/2018   R prox nasal ala   Depression    Dysplastic nevus 05/07/2013   R abdomen - mod to severe, excision 05/29/2013   Hyperlipidemia    Hypertension    Obesity    Sleep apnea    has a cpap, doesn't wear    Varicose vein    Past Surgical History:  Procedure Laterality Date   COLONOSCOPY WITH PROPOFOL N/A 09/08/2016   Procedure: COLONOSCOPY WITH PROPOFOL;  Surgeon: Wyline Mood, MD;  Location: [facility] ENDOSCOPY;  Service: Endoscopy;  Laterality: N/A;   COLONOSCOPY WITH PROPOFOL N/A 01/07/2020   Procedure: COLONOSCOPY WITH PROPOFOL;  Surgeon: Wyline Mood, MD;  Location: [facility] ENDOSCOPY;  Service: Gastroenterology;  Laterality: N/A;   FRACTURE SURGERY     foot, screws placed   JOINT REPLACEMENT Right 2009   knee   Family History  Problem Relation Age of Onset   Cancer Mother    Heart failure Mother    Heart disease Mother    Breast cancer Mother 20   Heart attack Mother    Obesity Father    Depression Father    Early death Father    Hyperlipidemia Father    AAA (abdominal aortic aneurysm) Father     Hypertension Father    Heart disease Father    Hypertension Sister    Social History   Socioeconomic History   Marital status: Married    Spouse name: Not on file   Number of children: Not on file   Years of education: Not on file   Highest education level: 12th grade  Occupational History   Not on file  Tobacco Use   Smoking status: Former    Current packs/day: 0.00    Types: Cigarettes    Quit date: 01/19/1988    Years since quitting: 35.0   Smokeless tobacco: Never  Vaping Use   Vaping status: Never Used  Substance and Sexual Activity   Alcohol use: Yes    Alcohol/week: 3.0 standard drinks of alcohol    Types: 3 Shots of liquor per week    Comment: 3 drinks a week socially    Drug use: No   Sexual activity: Not on file  Other Topics Concern   Not on file  Social History Narrative   Not on file   Social Determinants of Health   Financial Resource Strain: Low Risk  (08/23/2022)   Overall Financial Resource Strain (CARDIA)    Difficulty of Paying Living Expenses: Not  hard at all  Food Insecurity: No Food Insecurity (08/23/2022)   Hunger Vital Sign    Worried About Running Out of Food in the Last Year: Never true    Ran Out of Food in the Last Year: Never true  Transportation Needs: No Transportation Needs (08/23/2022)   PRAPARE - Administrator, Civil Service (Medical): No    Lack of Transportation (Non-Medical): No  Physical Activity: Insufficiently Active (08/23/2022)   Exercise Vital Sign    Days of Exercise per Week: 3 days    Minutes of Exercise per Session: 20 min  Stress: No Stress Concern Present (08/23/2022)   [employer] of Occupational Health - Occupational Stress Questionnaire    Feeling of Stress : Not at all  Social Connections: Socially Integrated (08/23/2022)   Social Connection and Isolation Panel [NHANES]    Frequency of Communication with Friends and Family: Twice a week    Frequency of Social Gatherings with Friends and Family: Once a  week    Attends Religious Services: 1 to 4 times per year    Active Member of [employer] or Organizations: Yes    Attends Engineer, structural: More than 4 times per year    Marital Status: Married     Review of Systems  Constitutional:  Negative for appetite change and unexpected weight change.  HENT:  Negative for congestion and sinus pressure.   Respiratory:  Negative for cough, chest tightness and shortness of breath.   Cardiovascular:  Negative for chest pain, palpitations and leg swelling.  Gastrointestinal:  Negative for abdominal pain, diarrhea, nausea and vomiting.  Genitourinary:  Negative for difficulty urinating and dysuria.  Musculoskeletal:  Negative for joint swelling and myalgias.  Skin:        Residual skin changes from shingles.   Neurological:  Negative for dizziness and headaches.  Psychiatric/Behavioral:  Negative for agitation and dysphoric mood.        Objective:     BP 134/72   Pulse 70   Temp 98.6 F (37 C)   Resp 16   Ht 5\' 6"  (1.676 m)   Wt 187 lb 3.2 oz (84.9 kg)   SpO2 98%   BMI 30.21 kg/m  Wt Readings from Last 3 Encounters:  01/24/23 187 lb 3.2 oz (84.9 kg)  01/18/23 187 lb (84.8 kg)  01/16/23 186 lb (84.4 kg)    Physical Exam Vitals reviewed.  Constitutional:      General: She is not in acute distress.    Appearance: Normal appearance.  HENT:     Head: Normocephalic and atraumatic.     Right Ear: External ear normal.     Left Ear: External ear normal.  Eyes:     General: No scleral icterus.       Right eye: No discharge.        Left eye: No discharge.     Conjunctiva/sclera: Conjunctivae normal.  Neck:     Thyroid: No thyromegaly.  Cardiovascular:     Rate and Rhythm: Normal rate and regular rhythm.  Pulmonary:     Effort: No respiratory distress.     Breath sounds: Normal breath sounds. No wheezing.  Abdominal:     General: Bowel sounds are normal.     Palpations: Abdomen is soft.     Tenderness: There is no  abdominal tenderness.  Musculoskeletal:        General: No swelling or tenderness.     Cervical back: Neck supple. No tenderness.  Lymphadenopathy:     Cervical: No cervical adenopathy.  Skin:    Comments: Residual skin changes from shingles - under right breast extending around right side to back.  Increased sensitivity to light touch.    Neurological:     Mental Status: She is alert.  Psychiatric:        Mood and Affect: Mood normal.        Behavior: Behavior normal.      Outpatient Encounter Medications as of 01/24/2023  Medication Sig   gabapentin (NEURONTIN) 100 MG capsule Take 1 capsule (100 mg total) by mouth 3 (three) times daily.   amLODipine (NORVASC) 10 MG tablet TAKE 1 TABLET BY MOUTH DAILY   atenolol (TENORMIN) 25 MG tablet Take 1 tablet (25 mg total) by mouth daily.   ezetimibe (ZETIA) 10 MG tablet Take 1 tablet (10 mg total) by mouth daily.   FLUoxetine (PROZAC) 40 MG capsule Take 1 capsule (40 mg total) by mouth daily.   losartan-hydrochlorothiazide (HYZAAR) 100-12.5 MG tablet Take 1 tablet by mouth daily.   tirzepatide (ZEPBOUND) 7.5 MG/0.5ML Pen Inject 7.5 mg into the skin once a week.   [DISCONTINUED] tirzepatide (ZEPBOUND) 7.5 MG/0.5ML Pen Inject 7.5 mg into the skin once a week.   No facility-administered encounter medications on file as of 01/24/2023.     Lab Results  Component Value Date   WBC 6.0 01/18/2023   HGB 14.2 01/18/2023   HCT 43.9 01/18/2023   PLT 260 01/18/2023   GLUCOSE 98 01/18/2023   CHOL 201 (H) 11/21/2022   TRIG 150.0 (H) 11/21/2022   HDL 62.00 11/21/2022   LDLCALC 109 (H) 11/21/2022   ALT 13 12/21/2022   AST 15 12/21/2022   NA 135 01/18/2023   K 3.6 01/18/2023   CL 106 01/18/2023   CREATININE 0.49 01/18/2023   BUN 16 01/18/2023   CO2 23 01/18/2023   TSH 1.990 12/23/2021   HGBA1C 5.2 11/21/2022    DG Chest 2 View  Result Date: 01/18/2023 CLINICAL DATA:  Shortness of breath EXAM: CHEST - 2 VIEW COMPARISON:  X-ray 08/15/2022  FINDINGS: The heart size and mediastinal contours are within normal limits. Hyperinflation. No consolidation, pneumothorax or effusion. No edema. Tortuous and ectatic aorta. The visualized skeletal structures are unremarkable. Degenerative changes of the spine. IMPRESSION: Hyperinflation.  No acute cardiopulmonary disease Electronically Signed   By: Karen Kays M.D.   On: 01/18/2023 10:22       Assessment & Plan:  Herpes zoster without complication Assessment & Plan: Diagnosed 01/08/23.  Treated with valtrex.  Taking ibuprofen and tylenol. Persistent pain and hypersensitivity to light touch.  Will add gabapentin 100mg  tid.  Discussed possible side effects of the medication.  Discussed titration.  Follow.  Can continue tylenol.     Morbid obesity (HCC) with starting BMI 37 -     Zepbound; Inject 7.5 mg into the skin once a week.  Dispense: 2 mL; Refill: 0  OSA on CPAP Assessment & Plan: Continue cpap.    Major depressive disorder, single episode, mild (HCC) Assessment & Plan: Continues on prozac.  Overall she feels she is doing ok.  Appears to be doing better. Follow.    Essential hypertension Assessment & Plan: Continues on amlodipine, hyzaar and atenolol.  No changes in medication.  Follow pressures.  Follow metabolic panel.    Other orders -     Gabapentin; Take 1 capsule (100 mg total) by mouth 3 (three) times daily.  Dispense: 90 capsule; Refill: 1  Dale Carleton, MD

## 2023-01-24 NOTE — Telephone Encounter (Signed)
Ok to work in today.  Please confirm no other acute symptoms.

## 2023-01-24 NOTE — Telephone Encounter (Signed)
She was not seen by you initially for shingles. She is leaving for [place] on Wednesday (I am assuming tomorrow but will confirm.) Would you prefer to see her?

## 2023-01-29 ENCOUNTER — Encounter: Payer: Self-pay | Admitting: Internal Medicine

## 2023-01-29 DIAGNOSIS — B029 Zoster without complications: Secondary | ICD-10-CM | POA: Insufficient documentation

## 2023-01-29 NOTE — Assessment & Plan Note (Signed)
Continues on prozac.  Overall she feels she is doing ok.  Appears to be doing better. Follow.  

## 2023-01-29 NOTE — Assessment & Plan Note (Signed)
Continue cpap.  

## 2023-01-29 NOTE — Assessment & Plan Note (Signed)
Continues on amlodipine, hyzaar and atenolol.  No changes in medication.  Follow pressures.  Follow metabolic panel.

## 2023-01-29 NOTE — Assessment & Plan Note (Signed)
Diagnosed 01/08/23.  Treated with valtrex.  Taking ibuprofen and tylenol. Persistent pain and hypersensitivity to light touch.  Will add gabapentin 100mg  tid.  Discussed possible side effects of the medication.  Discussed titration.  Follow.  Can continue tylenol.

## 2023-01-30 ENCOUNTER — Telehealth: Payer: Self-pay | Admitting: Internal Medicine

## 2023-01-30 NOTE — Telephone Encounter (Signed)
Copied from CRM [id number]. Topic: Medicare AWV >> Jan 30, 2023 10:22 AM Payton Doughty wrote: Reason for CRM: LM 01/30/2023 to schedule AWV   Verlee Rossetti; Care Guide Ambulatory Clinical Support [place] [facility] Direct Dial: [phone]

## 2023-02-18 ENCOUNTER — Other Ambulatory Visit: Payer: Self-pay | Admitting: Internal Medicine

## 2023-02-20 MED ORDER — ZEPBOUND 7.5 MG/0.5ML ~~LOC~~ SOAJ
7.5000 mg | SUBCUTANEOUS | 0 refills | Status: DC
Start: 2023-02-20 — End: 2023-03-08

## 2023-02-21 DIAGNOSIS — Z23 Encounter for immunization: Secondary | ICD-10-CM | POA: Diagnosis not present

## 2023-02-22 ENCOUNTER — Ambulatory Visit (INDEPENDENT_AMBULATORY_CARE_PROVIDER_SITE_OTHER): Payer: Medicare Other | Admitting: Internal Medicine

## 2023-03-08 ENCOUNTER — Ambulatory Visit (INDEPENDENT_AMBULATORY_CARE_PROVIDER_SITE_OTHER): Payer: Medicare Other | Admitting: Internal Medicine

## 2023-03-08 ENCOUNTER — Other Ambulatory Visit: Payer: Self-pay | Admitting: Internal Medicine

## 2023-03-08 ENCOUNTER — Encounter (INDEPENDENT_AMBULATORY_CARE_PROVIDER_SITE_OTHER): Payer: Self-pay | Admitting: Internal Medicine

## 2023-03-08 VITALS — BP 154/92 | HR 68 | Temp 97.9°F | Ht 66.0 in | Wt 182.0 lb

## 2023-03-08 DIAGNOSIS — R110 Nausea: Secondary | ICD-10-CM | POA: Diagnosis not present

## 2023-03-08 DIAGNOSIS — E669 Obesity, unspecified: Secondary | ICD-10-CM | POA: Diagnosis not present

## 2023-03-08 DIAGNOSIS — Z6826 Body mass index (BMI) 26.0-26.9, adult: Secondary | ICD-10-CM | POA: Insufficient documentation

## 2023-03-08 DIAGNOSIS — I10 Essential (primary) hypertension: Secondary | ICD-10-CM

## 2023-03-08 DIAGNOSIS — Z6829 Body mass index (BMI) 29.0-29.9, adult: Secondary | ICD-10-CM | POA: Diagnosis not present

## 2023-03-08 NOTE — Assessment & Plan Note (Signed)
Peak Weight: 231 Starting Weight : 224  Working BMR: 1555 kcal/day Weight loss goal: 10 to 15% Nutritional: Category 06-1198 cal with 90 g of protein per day Past Pharmacotherapy: None Current Pharmacotherapy: Zepbound 7.5 mg once a week managed by primary care team.  See obesity treatment plan

## 2023-03-08 NOTE — Assessment & Plan Note (Signed)
Medication related.  Advised to do shot in the evening or may continue during the day and drink some ginger ale and reduce meal portions day of injection.  I do not recommend increasing Zepbound continue at current dose.  Patient declined pharmacotherapy for her nausea

## 2023-03-08 NOTE — Assessment & Plan Note (Signed)
Blood pressure is not at goal for age.  I reviewed vitals flowsheet and she has had a few elevations recently.  She denies problems with adherence or medication side effects.  She is on atenolol, Hyzaar and amlodipine.  Patient advised to check blood pressure in the morning and also before bedtime to evaluate effectiveness of her regimen.  If her blood pressures are above 140/90 she was advised to schedule an appointment with primary care team for treatment intensification.

## 2023-03-08 NOTE — Progress Notes (Signed)
Office: [phone]  /  Fax: [phone]  WEIGHT SUMMARY AND BIOMETRICS  Vitals Temp: 97.9 F (36.6 C) BP: (!) 154/92 Pulse Rate: 68 SpO2: 99 %   Anthropometric Measurements Height: 5\' 6"  (1.676 m) Weight: 182 lb (82.6 kg) BMI (Calculated): 29.39 Weight at Last Visit: 186 lb Weight Lost Since Last Visit: 4 lb Weight Gained Since Last Visit: 0 lb Starting Weight: 224 lb Total Weight Loss (lbs): 44 lb (20 kg)   Body Composition  Body Fat %: 37.6 % Fat Mass (lbs): 68.6 lbs Muscle Mass (lbs): 108 lbs Total Body Water (lbs): 71.8 lbs Visceral Fat Rating : 11    No data recorded Today's Visit #: 11  Starting Date: 12/23/21   HPI  Chief Complaint: OBESITY  Sherry Davila is here to discuss her progress with her obesity treatment plan. She is on the keeping a food journal and adhering to recommended goals of 1200 calories and 85 protein and states she is following her eating plan approximately 60 % of the time. She states she is not exercising.  Interval History:   Discussed the use of AI scribe software for clinical note transcription with the patient, who gave verbal consent to proceed.  History of Present Illness   The patient, with a history of obesity, presents for a follow-up visit. She reports a weight loss of four pounds since the last visit. She expresses difficulty in maintaining a diet of 1200 calories per day, particularly in achieving adequate protein intake due to her preference for high-calorie protein sources like chicken with skin and steak. She acknowledges that skinless chicken breast would allow for more protein intake within her calorie limit.  The patient also reports a cessation of her exercise routine due to an 11-day vacation and subsequent social engagements. The vacation involved some swimming and walking, but was primarily a period of relaxation. Despite this, the patient managed to lose weight during this period.  She is currently on a medication  regimen for obesity, which she reports causes mild nausea on the first day of administration. The nausea is not severe enough to warrant additional medication.  The patient also reports a current focus on decluttering her home, which she considers a form of exercise. She expresses satisfaction with her current rate of weight loss and does not wish to increase her medication dosage due to concerns about exacerbating nausea.  The patient has a history of hypertension, which has shown variable control in recent readings. She is currently on a regimen of losartan, amlodipine, and atenolol. She reports occasional use of ibuprofen for headaches.      Pharmacotherapy for weight loss: She is currently taking Zepbound with adequate clinical response  and experiencing the following side effects: Mild nausea day of injection..  Medication managed by primary care team   ASSESSMENT AND PLAN  TREATMENT PLAN FOR OBESITY:  Recommended Dietary Goals  Sherry Davila is currently in the action stage of change. As such, her goal is to continue weight management plan. She has agreed to: continue current plan  Behavioral Intervention  We discussed the following Behavioral Modification Strategies today: continue to work on maintaining a reduced calorie state, getting the recommended amount of protein, incorporating whole foods, making healthy choices, staying well hydrated and practicing mindfulness when eating..  Additional resources provided today: None  Recommended Physical Activity Goals  Sherry Davila has been advised to work up to 150 minutes of moderate intensity aerobic activity a week and strengthening exercises 2-3 times per week for cardiovascular  health, weight loss maintenance and preservation of muscle mass.   She has agreed to :  Think about enjoyable ways to increase daily physical activity and overcoming barriers to exercise and Increase physical activity in their day and reduce sedentary time (increase  NEAT).  Pharmacotherapy We discussed various medication options to help Sherry Davila with her weight loss efforts and we both agreed to : continue with nutritional and behavioral strategies  ASSOCIATED CONDITIONS ADDRESSED TODAY  Generalized obesity with starting BMI of 37 Assessment & Plan: Peak Weight: 231 Starting Weight : 224  Working BMR: 1555 kcal/day Weight loss goal: 10 to 15% Nutritional: Category 06-1198 cal with 90 g of protein per day Past Pharmacotherapy: None Current Pharmacotherapy: Zepbound 7.5 mg once a week managed by primary care team.  See obesity treatment plan      Essential hypertension Assessment & Plan: Blood pressure is not at goal for age.  I reviewed vitals flowsheet and she has had a few elevations recently.  She denies problems with adherence or medication side effects.  She is on atenolol, Hyzaar and amlodipine.  Patient advised to check blood pressure in the morning and also before bedtime to evaluate effectiveness of her regimen.  If her blood pressures are above 140/90 she was advised to schedule an appointment with primary care team for treatment intensification.   Nausea Assessment & Plan: Medication related.  Advised to do shot in the evening or may continue during the day and drink some ginger ale and reduce meal portions day of injection.  I do not recommend increasing Zepbound continue at current dose.  Patient declined pharmacotherapy for her nausea   BMI 29.0-29.9,adult    PHYSICAL EXAM:  Blood pressure (!) 154/92, pulse 68, temperature 97.9 F (36.6 C), height 5\' 6"  (1.676 m), weight 182 lb (82.6 kg), SpO2 99%. Body mass index is 29.38 kg/m.  General: She is overweight, cooperative, alert, well developed, and in no acute distress. PSYCH: Has normal mood, affect and thought process.   HEENT: EOMI, sclerae are anicteric. Lungs: Normal breathing effort, no conversational dyspnea. Extremities: No edema.  Neurologic: No gross sensory or  motor deficits. No tremors or fasciculations noted.    DIAGNOSTIC DATA REVIEWED:  BMET    Component Value Date/Time   NA 135 01/18/2023 1000   NA 140 04/09/2018 1126   K 3.6 01/18/2023 1000   CL 106 01/18/2023 1000   CO2 23 01/18/2023 1000   GLUCOSE 98 01/18/2023 1000   BUN 16 01/18/2023 1000   BUN 22 04/09/2018 1126   CREATININE 0.49 01/18/2023 1000   CALCIUM 9.0 01/18/2023 1000   GFRNONAA >60 01/18/2023 1000   GFRAA 108 04/09/2018 1126   Lab Results  Component Value Date   HGBA1C 5.2 11/21/2022   HGBA1C 5.6 10/07/2020   No results found for: "INSULIN" Lab Results  Component Value Date   TSH 1.990 12/23/2021   CBC    Component Value Date/Time   WBC 6.0 01/18/2023 1000   RBC 4.82 01/18/2023 1000   HGB 14.2 01/18/2023 1000   HGB 14.1 09/28/2017 1557   HCT 43.9 01/18/2023 1000   HCT 41.8 09/28/2017 1557   PLT 260 01/18/2023 1000   PLT 234 09/28/2017 1557   MCV 91.1 01/18/2023 1000   MCV 91 09/28/2017 1557   MCH 29.5 01/18/2023 1000   MCHC 32.3 01/18/2023 1000   RDW 14.3 01/18/2023 1000   RDW 14.6 09/28/2017 1557   Iron Studies No results found for: "IRON", "TIBC", "FERRITIN", "IRONPCTSAT"  Lipid Panel     Component Value Date/Time   CHOL 201 (H) 11/21/2022 0918   CHOL 177 04/09/2018 1124   TRIG 150.0 (H) 11/21/2022 0918   TRIG 83 04/09/2018 1124   HDL 62.00 11/21/2022 0918   HDL 109 09/28/2017 1557   CHOLHDL 3 11/21/2022 0918   VLDL 30.0 11/21/2022 0918   VLDL 17 04/09/2018 1124   LDLCALC 109 (H) 11/21/2022 0918   LDLCALC 110 (H) 09/28/2017 1557   Hepatic Function Panel     Component Value Date/Time   PROT 7.2 12/21/2022 0921   PROT 7.0 09/28/2017 1557   ALBUMIN 4.2 12/21/2022 0921   ALBUMIN 4.7 09/28/2017 1557   AST 15 12/21/2022 0921   AST 28 04/09/2018 1124   ALT 13 12/21/2022 0921   ALT 29 04/09/2018 1124   ALKPHOS 101 12/21/2022 0921   BILITOT 0.6 12/21/2022 0921   BILITOT 0.4 09/28/2017 1557   BILIDIR 0.2 12/21/2022 0921       Component Value Date/Time   TSH 1.990 12/23/2021 1054   Nutritional Lab Results  Component Value Date   VD25OH 24.2 (L) 12/23/2021     Return in about 4 weeks (around 04/05/2023) for For Weight Mangement with Dr. Rikki Spearing.Marland Kitchen She was informed of the importance of frequent follow up visits to maximize her success with intensive lifestyle modifications for her multiple health conditions.   ATTESTASTION STATEMENTS:  Reviewed by clinician on day of visit: allergies, medications, problem list, medical history, surgical history, family history, social history, and previous encounter notes.     Worthy Rancher, MD

## 2023-03-13 MED ORDER — ZEPBOUND 7.5 MG/0.5ML ~~LOC~~ SOAJ
7.5000 mg | SUBCUTANEOUS | 0 refills | Status: DC
Start: 2023-03-13 — End: 2023-05-25

## 2023-03-13 NOTE — Telephone Encounter (Signed)
Called and spoke with pt and she confirmed she is currently on the 7.5 mg and would like to remain at the 7.5 mg instead of going up.

## 2023-03-15 ENCOUNTER — Other Ambulatory Visit: Payer: Self-pay | Admitting: Internal Medicine

## 2023-03-15 ENCOUNTER — Other Ambulatory Visit: Payer: Self-pay

## 2023-03-15 DIAGNOSIS — F3342 Major depressive disorder, recurrent, in full remission: Secondary | ICD-10-CM

## 2023-03-15 DIAGNOSIS — I10 Essential (primary) hypertension: Secondary | ICD-10-CM

## 2023-03-15 MED ORDER — ATENOLOL 25 MG PO TABS: 25.0000 mg | ORAL_TABLET | Freq: Every day | ORAL | 0 refills | Status: DC

## 2023-03-20 ENCOUNTER — Telehealth: Payer: Self-pay | Admitting: Internal Medicine

## 2023-03-20 DIAGNOSIS — I10 Essential (primary) hypertension: Secondary | ICD-10-CM

## 2023-03-20 DIAGNOSIS — R739 Hyperglycemia, unspecified: Secondary | ICD-10-CM

## 2023-03-20 DIAGNOSIS — E785 Hyperlipidemia, unspecified: Secondary | ICD-10-CM

## 2023-03-20 NOTE — Telephone Encounter (Signed)
Patient need lab orders.

## 2023-03-21 NOTE — Telephone Encounter (Signed)
Lab orders placed.  

## 2023-03-23 ENCOUNTER — Other Ambulatory Visit (INDEPENDENT_AMBULATORY_CARE_PROVIDER_SITE_OTHER): Payer: Medicare Other

## 2023-03-23 DIAGNOSIS — R739 Hyperglycemia, unspecified: Secondary | ICD-10-CM | POA: Diagnosis not present

## 2023-03-23 DIAGNOSIS — E785 Hyperlipidemia, unspecified: Secondary | ICD-10-CM | POA: Diagnosis not present

## 2023-03-23 DIAGNOSIS — I10 Essential (primary) hypertension: Secondary | ICD-10-CM | POA: Diagnosis not present

## 2023-03-23 LAB — LIPID PANEL
Cholesterol: 217 mg/dL — ABNORMAL HIGH (ref 0–200)
HDL: 92 mg/dL (ref >39.00)
LDL Cholesterol: 113 mg/dL — ABNORMAL HIGH (ref 0–99)
NonHDL: 124.66
Total CHOL/HDL Ratio: 2
Triglycerides: 60 mg/dL (ref 0.0–149.0)
VLDL: 12 mg/dL (ref 0.0–40.0)

## 2023-03-23 LAB — HEPATIC FUNCTION PANEL
ALT: 13 U/L (ref 0–35)
AST: 15 U/L (ref 0–37)
Albumin: 4 g/dL (ref 3.5–5.2)
Alkaline Phosphatase: 96 U/L (ref 39–117)
Bilirubin, Direct: 0.1 mg/dL (ref 0.0–0.3)
Total Bilirubin: 0.6 mg/dL (ref 0.2–1.2)
Total Protein: 6.3 g/dL (ref 6.0–8.3)

## 2023-03-23 LAB — BASIC METABOLIC PANEL
BUN: 18 mg/dL (ref 6–23)
CO2: 28 meq/L (ref 19–32)
Calcium: 9.2 mg/dL (ref 8.4–10.5)
Chloride: 103 meq/L (ref 96–112)
Creatinine, Ser: 0.63 mg/dL (ref 0.40–1.20)
GFR: 86.49 mL/min (ref >60.00)
Glucose, Bld: 76 mg/dL (ref 70–99)
Potassium: 4.5 meq/L (ref 3.5–5.1)
Sodium: 140 meq/L (ref 135–145)

## 2023-03-23 LAB — TSH: TSH: 2.3 u[IU]/mL (ref 0.35–5.50)

## 2023-03-23 LAB — HEMOGLOBIN A1C: Hgb A1c MFr Bld: 5 % (ref 4.6–6.5)

## 2023-03-27 ENCOUNTER — Ambulatory Visit (INDEPENDENT_AMBULATORY_CARE_PROVIDER_SITE_OTHER): Payer: Medicare Other | Admitting: Internal Medicine

## 2023-03-27 VITALS — BP 128/70 | HR 74 | Temp 98.0°F | Resp 16 | Ht 66.0 in | Wt 186.0 lb

## 2023-03-27 DIAGNOSIS — R739 Hyperglycemia, unspecified: Secondary | ICD-10-CM | POA: Diagnosis not present

## 2023-03-27 DIAGNOSIS — T466X5A Adverse effect of antihyperlipidemic and antiarteriosclerotic drugs, initial encounter: Secondary | ICD-10-CM

## 2023-03-27 DIAGNOSIS — F320 Major depressive disorder, single episode, mild: Secondary | ICD-10-CM

## 2023-03-27 DIAGNOSIS — G4733 Obstructive sleep apnea (adult) (pediatric): Secondary | ICD-10-CM | POA: Diagnosis not present

## 2023-03-27 DIAGNOSIS — E559 Vitamin D deficiency, unspecified: Secondary | ICD-10-CM

## 2023-03-27 DIAGNOSIS — G720 Drug-induced myopathy: Secondary | ICD-10-CM

## 2023-03-27 DIAGNOSIS — I10 Essential (primary) hypertension: Secondary | ICD-10-CM

## 2023-03-27 NOTE — Assessment & Plan Note (Signed)
The 10-year ASCVD risk score (Arnett DK, et al., 2019) is: 21.4%   Values used to calculate the score:     Age: 77 years     Sex: Female     Is Non-Hispanic African American: No     Diabetic: No     Tobacco smoker: No     Systolic Blood Pressure: 128 mmHg     Is BP treated: Yes     HDL Cholesterol: 92 mg/dL     Total Cholesterol: 217 mg/dL  Have discussed calculated cholesterol risk. Had muscle aches with statin medication.  Taking Zetia. Working on diet and exercise.  Losing weight.  Zetia.  Follow lipid panel.

## 2023-03-27 NOTE — Progress Notes (Signed)
Subjective:    Patient ID: Sherry Davila, female    DOB: [birth year], 77 y.o.   MRN: [id number]  Patient here for  Chief Complaint  Patient presents with   Medical Management of Chronic Issues    HPI Here to follow up regarding hypercholesterolemia and hypertension. She is currently being followed by weight management ([facility]).  Starting weight 224lbs. Currently on zepbound. Tolerating.  Tries to stay active.  No chest pain or sob reported. No cough or congestion.  No abdominal pain.  Discussed atenolol.  Discussed possibility of changing to carvedilol.    Past Medical History:  Diagnosis Date   Actinic keratosis 04/04/2018   L nose lat to supra tip   Anxiety    Basal cell carcinoma 04/04/2018   R prox nasal ala   Depression    Dysplastic nevus 05/07/2013   R abdomen - mod to severe, excision 05/29/2013   Hyperlipidemia    Hypertension    Obesity    Sleep apnea    has a cpap, doesn't wear    Varicose vein    Past Surgical History:  Procedure Laterality Date   COLONOSCOPY WITH PROPOFOL N/A 09/08/2016   Procedure: COLONOSCOPY WITH PROPOFOL;  Surgeon: Wyline Mood, MD;  Location: [facility] ENDOSCOPY;  Service: Endoscopy;  Laterality: N/A;   COLONOSCOPY WITH PROPOFOL N/A 01/07/2020   Procedure: COLONOSCOPY WITH PROPOFOL;  Surgeon: Wyline Mood, MD;  Location: [facility] ENDOSCOPY;  Service: Gastroenterology;  Laterality: N/A;   FRACTURE SURGERY     foot, screws placed   JOINT REPLACEMENT Right 2009   knee   Family History  Problem Relation Age of Onset   Cancer Mother    Heart failure Mother    Heart disease Mother    Breast cancer Mother 14   Heart attack Mother    Obesity Father    Depression Father    Early death Father    Hyperlipidemia Father    AAA (abdominal aortic aneurysm) Father    Hypertension Father    Heart disease Father    Hypertension Sister    Social History   Socioeconomic History   Marital status: Married    Spouse name: Not on file   Number of  children: Not on file   Years of education: Not on file   Highest education level: 12th grade  Occupational History   Not on file  Tobacco Use   Smoking status: Former    Current packs/day: 0.00    Types: Cigarettes    Quit date: 01/19/1988    Years since quitting: 35.2   Smokeless tobacco: Never  Vaping Use   Vaping status: Never Used  Substance and Sexual Activity   Alcohol use: Yes    Alcohol/week: 3.0 standard drinks of alcohol    Types: 3 Shots of liquor per week    Comment: 3 drinks a week socially    Drug use: No   Sexual activity: Not on file  Other Topics Concern   Not on file  Social History Narrative   Not on file   Social Determinants of Health   Financial Resource Strain: Low Risk  (03/23/2023)   Overall Financial Resource Strain (CARDIA)    Difficulty of Paying Living Expenses: Not very hard  Food Insecurity: No Food Insecurity (03/23/2023)   Hunger Vital Sign    Worried About Running Out of Food in the Last Year: Never true    Ran Out of Food in the Last Year: Never true  Transportation Needs: No  Transportation Needs (03/23/2023)   PRAPARE - Administrator, Civil Service (Medical): No    Lack of Transportation (Non-Medical): No  Physical Activity: Unknown (03/23/2023)   Exercise Vital Sign    Days of Exercise per Week: Patient declined    Minutes of Exercise per Session: 20 min  Stress: No Stress Concern Present (03/23/2023)   [employer] of Occupational Health - Occupational Stress Questionnaire    Feeling of Stress : Only a little  Social Connections: Unknown (03/23/2023)   Social Connection and Isolation Panel [NHANES]    Frequency of Communication with Friends and Family: Patient declined    Frequency of Social Gatherings with Friends and Family: Patient declined    Attends Religious Services: Patient declined    Database administrator or Organizations: Yes    Attends Engineer, structural: More than 4 times per year     Marital Status: Married     Review of Systems  Constitutional:  Negative for appetite change and unexpected weight change.  HENT:  Negative for congestion and sinus pressure.   Respiratory:  Negative for cough, chest tightness and shortness of breath.   Cardiovascular:  Negative for chest pain and palpitations.  Gastrointestinal:  Negative for abdominal pain, diarrhea, nausea and vomiting.  Genitourinary:  Negative for difficulty urinating and dysuria.  Musculoskeletal:  Negative for joint swelling and myalgias.  Skin:  Negative for color change and rash.  Neurological:  Negative for dizziness and headaches.  Psychiatric/Behavioral:  Negative for agitation and dysphoric mood.        Objective:     BP 128/70   Pulse 74   Temp 98 F (36.7 C)   Resp 16   Ht 5\' 6"  (1.676 m)   Wt 186 lb (84.4 kg)   SpO2 98%   BMI 30.02 kg/m  Wt Readings from Last 3 Encounters:  03/27/23 186 lb (84.4 kg)  03/08/23 182 lb (82.6 kg)  01/24/23 187 lb 3.2 oz (84.9 kg)    Physical Exam Vitals reviewed.  Constitutional:      General: She is not in acute distress.    Appearance: Normal appearance.  HENT:     Head: Normocephalic and atraumatic.     Right Ear: External ear normal.     Left Ear: External ear normal.  Eyes:     General: No scleral icterus.       Right eye: No discharge.        Left eye: No discharge.     Conjunctiva/sclera: Conjunctivae normal.  Neck:     Thyroid: No thyromegaly.  Cardiovascular:     Rate and Rhythm: Normal rate and regular rhythm.  Pulmonary:     Effort: No respiratory distress.     Breath sounds: Normal breath sounds. No wheezing.  Abdominal:     General: Bowel sounds are normal.     Palpations: Abdomen is soft.     Tenderness: There is no abdominal tenderness.  Musculoskeletal:        General: No swelling or tenderness.     Cervical back: Neck supple. No tenderness.  Lymphadenopathy:     Cervical: No cervical adenopathy.  Skin:    Findings: No  erythema or rash.  Neurological:     Mental Status: She is alert.  Psychiatric:        Mood and Affect: Mood normal.        Behavior: Behavior normal.      Outpatient Encounter Medications as of 03/27/2023  Medication Sig   amLODipine (NORVASC) 10 MG tablet TAKE 1 TABLET BY MOUTH DAILY   atenolol (TENORMIN) 25 MG tablet Take 1 tablet (25 mg total) by mouth daily. Please call to schedule 12 month follow up - [phone]   ezetimibe (ZETIA) 10 MG tablet Take 1 tablet (10 mg total) by mouth daily.   FLUoxetine (PROZAC) 40 MG capsule TAKE 1 CAPSULE BY MOUTH DAILY   losartan-hydrochlorothiazide (HYZAAR) 100-12.5 MG tablet TAKE 1 TABLET BY MOUTH DAILY   tirzepatide (ZEPBOUND) 7.5 MG/0.5ML Pen Inject 7.5 mg into the skin once a week.   No facility-administered encounter medications on file as of 03/27/2023.     Lab Results  Component Value Date   WBC 6.0 01/18/2023   HGB 14.2 01/18/2023   HCT 43.9 01/18/2023   PLT 260 01/18/2023   GLUCOSE 76 03/23/2023   CHOL 217 (H) 03/23/2023   TRIG 60.0 03/23/2023   HDL 92.00 03/23/2023   LDLCALC 113 (H) 03/23/2023   ALT 13 03/23/2023   AST 15 03/23/2023   NA 140 03/23/2023   K 4.5 03/23/2023   CL 103 03/23/2023   CREATININE 0.63 03/23/2023   BUN 18 03/23/2023   CO2 28 03/23/2023   TSH 2.30 03/23/2023   HGBA1C 5.0 03/23/2023    DG Chest 2 View  Result Date: 01/18/2023 CLINICAL DATA:  Shortness of breath EXAM: CHEST - 2 VIEW COMPARISON:  X-ray 08/15/2022 FINDINGS: The heart size and mediastinal contours are within normal limits. Hyperinflation. No consolidation, pneumothorax or effusion. No edema. Tortuous and ectatic aorta. The visualized skeletal structures are unremarkable. Degenerative changes of the spine. IMPRESSION: Hyperinflation.  No acute cardiopulmonary disease Electronically Signed   By: Karen Kays M.D.   On: 01/18/2023 10:22       Assessment & Plan:  Vitamin D deficiency Assessment & Plan: Last vitamin D level 31.  Will  need recheck.    Statin myopathy Assessment & Plan: Muscle aches with statin.     OSA on CPAP Assessment & Plan: Continue cpap.    Major depressive disorder, single episode, mild (HCC) Assessment & Plan: Continues on prozac.  Appears to be doing better. Follow.    Hyperglycemia Assessment & Plan: Low carb diet and exercise. Follow met b and a1c.  Lab Results  Component Value Date   HGBA1C 5.0 03/23/2023     Essential hypertension Assessment & Plan: Blood pressure as outlined on check here.  Was elevated - recent visit - weight management.  Discussed possibility of changing medication.  Given blood pressure ok today, hold on changing.  Follow pressures.  Follow metabolic panel.       Dale Kanawha, MD

## 2023-04-01 ENCOUNTER — Encounter: Payer: Self-pay | Admitting: Internal Medicine

## 2023-04-01 NOTE — Assessment & Plan Note (Signed)
Continues on prozac.  Appears to be doing better.  Follow.

## 2023-04-01 NOTE — Assessment & Plan Note (Signed)
Continue cpap.  

## 2023-04-01 NOTE — Assessment & Plan Note (Signed)
Low carb diet and exercise. Follow met b and a1c.  Lab Results  Component Value Date   HGBA1C 5.0 03/23/2023

## 2023-04-01 NOTE — Assessment & Plan Note (Signed)
Blood pressure as outlined on check here.  Was elevated - recent visit - weight management.  Discussed possibility of changing medication.  Given blood pressure ok today, hold on changing.  Follow pressures.  Follow metabolic panel.

## 2023-04-01 NOTE — Assessment & Plan Note (Signed)
Muscle aches with statin.

## 2023-04-01 NOTE — Assessment & Plan Note (Signed)
Last vitamin D level 31.  Will need recheck.

## 2023-04-05 ENCOUNTER — Encounter (INDEPENDENT_AMBULATORY_CARE_PROVIDER_SITE_OTHER): Payer: Self-pay | Admitting: Internal Medicine

## 2023-04-05 ENCOUNTER — Ambulatory Visit (INDEPENDENT_AMBULATORY_CARE_PROVIDER_SITE_OTHER): Payer: Medicare Other | Admitting: Internal Medicine

## 2023-04-05 VITALS — BP 134/82 | HR 72 | Temp 98.5°F | Ht 66.0 in | Wt 181.0 lb

## 2023-04-05 DIAGNOSIS — G4730 Sleep apnea, unspecified: Secondary | ICD-10-CM | POA: Diagnosis not present

## 2023-04-05 DIAGNOSIS — G4733 Obstructive sleep apnea (adult) (pediatric): Secondary | ICD-10-CM

## 2023-04-05 DIAGNOSIS — E669 Obesity, unspecified: Secondary | ICD-10-CM | POA: Diagnosis not present

## 2023-04-05 DIAGNOSIS — E785 Hyperlipidemia, unspecified: Secondary | ICD-10-CM | POA: Diagnosis not present

## 2023-04-05 DIAGNOSIS — Z6829 Body mass index (BMI) 29.0-29.9, adult: Secondary | ICD-10-CM | POA: Diagnosis not present

## 2023-04-05 DIAGNOSIS — I10 Essential (primary) hypertension: Secondary | ICD-10-CM

## 2023-04-05 NOTE — Progress Notes (Signed)
Office: [phone]  /  Fax: [phone]  Weight Summary And Biometrics  Vitals Temp: 98.5 F (36.9 C) BP: 134/82 Pulse Rate: 72 SpO2: 99 %   Anthropometric Measurements Height: 5\' 6"  (1.676 m) Weight: 181 lb (82.1 kg) BMI (Calculated): 29.23 Weight at Last Visit: 182 lbb Weight Lost Since Last Visit: 1 lb Weight Gained Since Last Visit: 0 lb Starting Weight: 224 lb Total Weight Loss (lbs): 45 lb (20.4 kg)   Body Composition  Body Fat %: 43.6 % Fat Mass (lbs): 79.2 lbs Muscle Mass (lbs): 97.2 lbs Total Body Water (lbs): 72.4 lbs Visceral Fat Rating : 12    No data recorded Today's Visit #: 12  Starting Date: 12/23/21   Subjective   Chief Complaint: Obesity  Sherry Davila is here to discuss her progress with her obesity treatment plan. She is on the keeping a food journal and adhering to recommended goals of 1200 calories and 80 protein and states she is following her eating plan approximately 70 % of the time. She states she is not exercising.  Interval History:   Discussed the use of AI scribe software for clinical note transcription with the patient, who gave verbal consent to proceed.   Discussed the use of AI scribe software for clinical note transcription with the patient, who gave verbal consent to proceed.  History of Present Illness    The patient, with a history of obesity and hypertension, presents for a follow-up consultation. She has been on a weight loss journey, which has been successful to date, with a total loss of 50 pounds. However, the patient has recently experienced a plateau in her weight loss, which has been ongoing since August of this year. She denies any nausea or constipation, but reports possible hair loss, which she is unsure of.  The patient is currently consuming two meals a day, lunch and dinner, and reports not feeling hungry in the morning. She has been struggling with maintaining a low-calorie diet while ensuring adequate protein  intake. The patient reports difficulty in achieving the recommended 80 grams of protein per day.  In terms of physical activity, the patient has been focusing on decluttering her home, which she considers a form of non-exercise activity. She has previously engaged in gym workouts, including cardio and weights, and is considering joining a six-week boot camp, pending further information about its suitability for her age and fitness level.  The patient has a history of sleep apnea and is on CPAP. She is also on amlodipine, atenolol, and losartan hydrochlorothiazide for hypertension, and Zetia for cholesterol. She reports no lightheadedness or dizziness. The patient has previously experienced weakness in the legs due to statins, and is now on Zetia for cholesterol management.  The patient's goal is to lose an additional 30 pounds, bringing her total weight to 150 pounds. She is aware of the need to preserve muscle mass during this process to prevent weakness and balance issues.      Orexigenic Control:  Denies problems with appetite and hunger signals.  Denies problems with satiety and satiation.  Denies problems with eating patterns and portion control.  Denies abnormal cravings. Denies feeling deprived or restricted.   Barriers identified: none.   Pharmacotherapy for weight loss: She is currently taking Zepbound with adequate clinical response  and without side effects..   Assessment and Plan   Treatment Plan For Obesity:  Recommended Dietary Goals  Sherry Davila is currently in the action stage of change. As such, her goal is to  continue weight management plan. She has agreed to: continue current plan  Behavioral Intervention  We discussed the following Behavioral Modification Strategies today: continue to work on maintaining a reduced calorie state, getting the recommended amount of protein, incorporating whole foods, making healthy choices, staying well hydrated and practicing mindfulness  when eating..  Additional resources provided today: None  Recommended Physical Activity Goals  Sherry Davila has been advised to work up to 150 minutes of moderate intensity aerobic activity a week and strengthening exercises 2-3 times per week for cardiovascular health, weight loss maintenance and preservation of muscle mass.   She has agreed to :  Think about enjoyable ways to increase daily physical activity and overcoming barriers to exercise and Increase physical activity in their day and reduce sedentary time (increase NEAT).  Pharmacotherapy  We discussed various medication options to help Sherry Davila with her weight loss efforts and we both agreed to : continue current anti-obesity medication regimen  Associated Conditions Addressed Today  Assessment and Plan    Obesity Since August 2023, she has achieved significant weight loss, dropping from 231 lbs to 181 lbs, but has experienced a plateau since August 2024, possibly due to a decrease in metabolic rate from caloric restriction and a 22% loss of total body weight. We discussed the importance of maintaining a protein intake of 30-40 grams per meal and increasing physical activity with a focus on strength training, while advising against high-intensity or high-impact workouts, recommending programs tailored for seniors to prevent injury. Her goal weight is 150 lbs. We will order indirect calorimetry to assess her metabolic rate, encourage a protein intake of 30-40 grams per meal, recommend increasing physical activity focusing on strength training. A follow-up in 4 weeks for reassessment is scheduled.  Hypertension Her blood pressure is well-controlled at 134/82 mmHg on current medications, including amlodipine, atenolol, and losartan hydrochlorothiazide, with no reported symptoms of lightheadedness or dizziness. We will continue the current antihypertensive medications.  Hyperlipidemia Her LDL cholesterol is slightly above the target at 113  mg/dL. She is on ezetimibe due to previous statin intolerance, which caused leg weakness. We will continue ezetimibe and advise reducing saturated fat intake.  Sleep Apnea She is managing sleep apnea with CPAP therapy, which will be continued.  She has lost 22% of total body weight she may be worthwhile repeating her sleep study to see if she still requires CPAP therapy  General Health Maintenance Recent blood work from March 23, 2023, shows normal blood sugar at 76 mg/dL, normal kidney function with a GFR of 86, normal liver enzymes, and an A1c of 5.0%. We encourage regular follow-up for blood work and health assessments.  Follow-up A follow-up appointment is scheduled in 4 weeks. She should ensure fasting and avoid caffeine before the indirect calorimetry test.         Objective   Physical Exam:  Blood pressure 134/82, pulse 72, temperature 98.5 F (36.9 C), height 5\' 6"  (1.676 m), weight 181 lb (82.1 kg), SpO2 99%. Body mass index is 29.21 kg/m.  General: She is overweight, cooperative, alert, well developed, and in no acute distress. PSYCH: Has normal mood, affect and thought process.   HEENT: EOMI, sclerae are anicteric. Lungs: Normal breathing effort, no conversational dyspnea. Extremities: No edema.  Neurologic: No gross sensory or motor deficits. No tremors or fasciculations noted.    Diagnostic Data Reviewed:  BMET    Component Value Date/Time   NA 140 03/23/2023 0926   NA 140 04/09/2018 1126   K 4.5  03/23/2023 0926   CL 103 03/23/2023 0926   CO2 28 03/23/2023 0926   GLUCOSE 76 03/23/2023 0926   BUN 18 03/23/2023 0926   BUN 22 04/09/2018 1126   CREATININE 0.63 03/23/2023 0926   CALCIUM 9.2 03/23/2023 0926   GFRNONAA >60 01/18/2023 1000   GFRAA 108 04/09/2018 1126   Lab Results  Component Value Date   HGBA1C 5.0 03/23/2023   HGBA1C 5.6 10/07/2020   No results found for: "INSULIN" Lab Results  Component Value Date   TSH 2.30 03/23/2023   CBC     Component Value Date/Time   WBC 6.0 01/18/2023 1000   RBC 4.82 01/18/2023 1000   HGB 14.2 01/18/2023 1000   HGB 14.1 09/28/2017 1557   HCT 43.9 01/18/2023 1000   HCT 41.8 09/28/2017 1557   PLT 260 01/18/2023 1000   PLT 234 09/28/2017 1557   MCV 91.1 01/18/2023 1000   MCV 91 09/28/2017 1557   MCH 29.5 01/18/2023 1000   MCHC 32.3 01/18/2023 1000   RDW 14.3 01/18/2023 1000   RDW 14.6 09/28/2017 1557   Iron Studies No results found for: "IRON", "TIBC", "FERRITIN", "IRONPCTSAT" Lipid Panel     Component Value Date/Time   CHOL 217 (H) 03/23/2023 0926   CHOL 177 04/09/2018 1124   TRIG 60.0 03/23/2023 0926   TRIG 83 04/09/2018 1124   HDL 92.00 03/23/2023 0926   HDL 109 09/28/2017 1557   CHOLHDL 2 03/23/2023 0926   VLDL 12.0 03/23/2023 0926   VLDL 17 04/09/2018 1124   LDLCALC 113 (H) 03/23/2023 0926   LDLCALC 110 (H) 09/28/2017 1557   Hepatic Function Panel     Component Value Date/Time   PROT 6.3 03/23/2023 0926   PROT 7.0 09/28/2017 1557   ALBUMIN 4.0 03/23/2023 0926   ALBUMIN 4.7 09/28/2017 1557   AST 15 03/23/2023 0926   AST 28 04/09/2018 1124   ALT 13 03/23/2023 0926   ALT 29 04/09/2018 1124   ALKPHOS 96 03/23/2023 0926   BILITOT 0.6 03/23/2023 0926   BILITOT 0.4 09/28/2017 1557   BILIDIR 0.1 03/23/2023 0926      Component Value Date/Time   TSH 2.30 03/23/2023 0926   Nutritional Lab Results  Component Value Date   VD25OH 24.2 (L) 12/23/2021    Follow-Up   Return in about 4 weeks (around 05/03/2023) for For Weight Mangement with Dr. Rikki Spearing - fasting for IC.Marland Kitchen She was informed of the importance of frequent follow up visits to maximize her success with intensive lifestyle modifications for her multiple health conditions.  Attestation Statement   Reviewed by clinician on day of visit: allergies, medications, problem list, medical history, surgical history, family history, social history, and previous encounter notes.     Worthy Rancher, MD

## 2023-04-10 ENCOUNTER — Telehealth: Payer: Self-pay | Admitting: Internal Medicine

## 2023-04-10 ENCOUNTER — Ambulatory Visit (INDEPENDENT_AMBULATORY_CARE_PROVIDER_SITE_OTHER): Payer: Medicare Other | Admitting: *Deleted

## 2023-04-10 VITALS — Ht 66.0 in | Wt 181.0 lb

## 2023-04-10 DIAGNOSIS — Z780 Asymptomatic menopausal state: Secondary | ICD-10-CM

## 2023-04-10 DIAGNOSIS — Z0000 Encounter for general adult medical examination without abnormal findings: Secondary | ICD-10-CM

## 2023-04-10 NOTE — Telephone Encounter (Signed)
Received a note from [place] questioning when Sherry Davila was due for f/u colonoscopy.  Per colonoscopy report in 2021 - due f/u colonoscopy 5 years (after 2021).

## 2023-04-10 NOTE — Patient Instructions (Signed)
Sherry Davila , Thank you for taking time to come for your Medicare Wellness Visit. I appreciate your ongoing commitment to your health goals. Please review the following plan we discussed and let me know if I can assist you in the future.   Referrals/Orders/Follow-Ups/Clinician Recommendations: Bone Density  You have an order for:  []   2D Mammogram  []   3D Mammogram  [x]   Bone Density     Please call for appointment:  [facility]  [address] [place] [place] [address] [phone]   Make sure to wear two-piece clothing.  No lotions, powders, or deodorants the day of the appointment. Make sure to bring picture ID and insurance card.  Bring list of medications you are currently taking including any supplements.   Schedule your [place] screening mammogram through MyChart!   Log into your MyChart account.  Go to 'Visit' (or 'Appointments' if on mobile App) --> Schedule an Appointment  Under 'Select a Reason for Visit' choose the Mammogram Screening option.  Complete the pre-visit questions and select the time and place that best fits your schedule.    This is a list of the screening recommended for you and due dates:  Health Maintenance  Topic Date Due   Colon Cancer Screening  01/07/2023   Zoster (Shingles) Vaccine (1 of 2) 06/27/2023*   Pneumonia Vaccine (2 of 2 - PPSV23 or PCV20) 08/24/2023*   COVID-19 Vaccine (6 - 2023-24 season) 04/18/2023   Mammogram  11/30/2023   Medicare Annual Wellness Visit  04/09/2024   DTaP/Tdap/Td vaccine (3 - Td or Tdap) 12/09/2026   Flu Shot  Completed   DEXA scan (bone density measurement)  Completed   Hepatitis C Screening  Completed   HPV Vaccine  Aged Out  *Topic was postponed. The date shown is not the original due date.    Advanced directives: (Copy Requested) Please bring a copy of your health care power of attorney and living will to the office to be added to your chart at your  convenience.  Next Medicare Annual Wellness Visit scheduled for next year: Yes 04/12/24 @ 10:50

## 2023-04-10 NOTE — Progress Notes (Signed)
Subjective:   Sherry Davila is a 77 y.o. female who presents for Medicare Annual (Subsequent) preventive examination.  Visit Complete: Virtual I connected with  Sherry Davila on 04/10/23 by a audio enabled telemedicine application and verified that I am speaking with the correct person using two identifiers.  Patient Location: Home  Provider Location: Office/Clinic  I discussed the limitations of evaluation and management by telemedicine. The patient expressed understanding and agreed to proceed.  Vital Signs: Because this visit was a virtual/telehealth visit, some criteria may be missing or patient reported. Any vitals not documented were not able to be obtained and vitals that have been documented are patient reported.  Patient Medicare AWV questionnaire was completed by the patient on 04/06/23; I have confirmed that all information answered by patient is correct and no changes since this date.  Cardiac Risk Factors include: advanced age (>4men, >44 women);dyslipidemia;hypertension     Objective:    Today's Vitals   04/10/23 1507  Weight: 181 lb (82.1 kg)  Height: 5\' 6"  (1.676 m)   Body mass index is 29.21 kg/m.     04/10/2023    3:14 PM 01/18/2023    9:58 AM 01/18/2023    9:04 AM 04/25/2022   10:55 AM 10/11/2021    2:09 PM 10/09/2020    1:30 PM 01/07/2020    7:33 AM  Advanced Directives  Does Patient Have a Medical Advance Directive? Yes Yes No No No No Yes  Type of Estate agent of [place];Living will Living will       Copy of Healthcare Power of Attorney in Chart? No - copy requested        Would patient like information on creating a medical advance directive?  No - Patient declined  No - Patient declined No - Patient declined No - Patient declined     Current Medications (verified) Outpatient Encounter Medications as of 04/10/2023  Medication Sig   amLODipine (NORVASC) 10 MG tablet TAKE 1 TABLET BY MOUTH DAILY   atenolol (TENORMIN)  25 MG tablet Take 1 tablet (25 mg total) by mouth daily. Please call to schedule 12 month follow up - [phone]   ezetimibe (ZETIA) 10 MG tablet Take 1 tablet (10 mg total) by mouth daily.   FLUoxetine (PROZAC) 40 MG capsule TAKE 1 CAPSULE BY MOUTH DAILY   losartan-hydrochlorothiazide (HYZAAR) 100-12.5 MG tablet TAKE 1 TABLET BY MOUTH DAILY   tirzepatide (ZEPBOUND) 7.5 MG/0.5ML Pen Inject 7.5 mg into the skin once a week.   No facility-administered encounter medications on file as of 04/10/2023.    Allergies (verified) Patient has no known allergies.   History: Past Medical History:  Diagnosis Date   Actinic keratosis 04/04/2018   L nose lat to supra tip   Anxiety    Basal cell carcinoma 04/04/2018   R prox nasal ala   Depression    Dysplastic nevus 05/07/2013   R abdomen - mod to severe, excision 05/29/2013   Hyperlipidemia    Hypertension    Obesity    Sleep apnea    has a cpap, doesn't wear    Varicose vein    Past Surgical History:  Procedure Laterality Date   COLONOSCOPY WITH PROPOFOL N/A 09/08/2016   Procedure: COLONOSCOPY WITH PROPOFOL;  Surgeon: Wyline Mood, MD;  Location: [facility] ENDOSCOPY;  Service: Endoscopy;  Laterality: N/A;   COLONOSCOPY WITH PROPOFOL N/A 01/07/2020   Procedure: COLONOSCOPY WITH PROPOFOL;  Surgeon: Wyline Mood, MD;  Location: [facility] ENDOSCOPY;  Service:  Gastroenterology;  Laterality: N/A;   FRACTURE SURGERY     foot, screws placed   JOINT REPLACEMENT Right 2009   knee   Family History  Problem Relation Age of Onset   Cancer Mother    Heart failure Mother    Heart disease Mother    Breast cancer Mother 62   Heart attack Mother    Obesity Father    Depression Father    Early death Father    Hyperlipidemia Father    AAA (abdominal aortic aneurysm) Father    Hypertension Father    Heart disease Father    Hypertension Sister    Social History   Socioeconomic History   Marital status: Married    Spouse name: Not on file   Number of  children: Not on file   Years of education: Not on file   Highest education level: 12th grade  Occupational History   Not on file  Tobacco Use   Smoking status: Former    Current packs/day: 0.00    Types: Cigarettes    Quit date: 01/19/1988    Years since quitting: 35.2   Smokeless tobacco: Never  Vaping Use   Vaping status: Never Used  Substance and Sexual Activity   Alcohol use: Yes    Alcohol/week: 3.0 standard drinks of alcohol    Types: 3 Shots of liquor per week    Comment: 3 drinks a week socially    Drug use: No   Sexual activity: Not on file  Other Topics Concern   Not on file  Social History Narrative   Married   Social Determinants of Health   Financial Resource Strain: Low Risk  (04/06/2023)   Overall Financial Resource Strain (CARDIA)    Difficulty of Paying Living Expenses: Not hard at all  Food Insecurity: No Food Insecurity (04/06/2023)   Hunger Vital Sign    Worried About Running Out of Food in the Last Year: Never true    Ran Out of Food in the Last Year: Never true  Transportation Needs: No Transportation Needs (04/06/2023)   PRAPARE - Administrator, Civil Service (Medical): No    Lack of Transportation (Non-Medical): No  Physical Activity: Insufficiently Active (04/06/2023)   Exercise Vital Sign    Days of Exercise per Week: 1 day    Minutes of Exercise per Session: 30 min  Stress: No Stress Concern Present (04/06/2023)   [employer] of Occupational Health - Occupational Stress Questionnaire    Feeling of Stress : Not at all  Social Connections: Moderately Integrated (04/06/2023)   Social Connection and Isolation Panel [NHANES]    Frequency of Communication with Friends and Family: Once a week    Frequency of Social Gatherings with Friends and Family: Once a week    Attends Religious Services: More than 4 times per year    Active Member of [employer] or Organizations: Yes    Attends Engineer, structural: More than 4 times  per year    Marital Status: Married    Tobacco Counseling Counseling given: Not Answered   Clinical Intake:  Pre-visit preparation completed: Yes  Pain : No/denies pain     BMI - recorded: 29.21 Nutritional Status: BMI 25 -29 Overweight Nutritional Risks: None Diabetes: No  How often do you need to have someone help you when you read instructions, pamphlets, or other written materials from your doctor or pharmacy?: 1 - Never  Interpreter Needed?: No  Information entered by :: R. Jaimes Eckert  LPN   Activities of Daily Living    04/06/2023   11:05 AM  In your present state of health, do you have any difficulty performing the following activities:  Hearing? 0  Vision? 0  Comment readers  Difficulty concentrating or making decisions? 0  Walking or climbing stairs? 0  Dressing or bathing? 0  Doing errands, shopping? 0  Preparing Food and eating ? N  Using the Toilet? N  In the past six months, have you accidently leaked urine? N  Do you have problems with loss of bowel control? N  Managing your Medications? N  Managing your Finances? N  Housekeeping or managing your Housekeeping? N    Patient Care Team: Dale Sedillo, MD as PCP - General (Internal Medicine) Debbe Odea, MD as PCP - Cardiology (Cardiology)  Indicate any recent Medical Services you may have received from other than [facility] providers in the past year (date may be approximate).     Assessment:   This is a routine wellness examination for Lareine.  Hearing/Vision screen Hearing Screening - Comments:: No issues Vision Screening - Comments:: readers   Goals Addressed             This Visit's Progress    Patient Stated       Wants to lose weight and exercise       Depression Screen    04/10/2023    3:11 PM 03/27/2023    2:58 PM 11/23/2022    1:20 PM 11/17/2022    2:13 PM 09/19/2022   11:43 AM 07/18/2022   12:14 PM 07/18/2022   12:00 PM  PHQ 2/9 Scores  PHQ - 2 Score 0 0 0 0 0 1 0  PHQ-  9 Score 0 2 0    0    Fall Risk    04/06/2023   11:05 AM 11/17/2022    2:12 PM 09/19/2022   11:43 AM 09/19/2022   11:42 AM 07/18/2022   12:00 PM  Fall Risk   Falls in the past year? 1 1 0 0 0  Number falls in past yr: 0 1 0 0 0  Injury with Fall? 0 0 0 0 0  Risk for fall due to : History of fall(s);Impaired balance/gait No Fall Risks No Fall Risks No Fall Risks No Fall Risks  Follow up Falls evaluation completed;Falls prevention discussed Falls evaluation completed Falls evaluation completed Falls evaluation completed Falls evaluation completed    MEDICARE RISK AT HOME: Medicare Risk at Home Any stairs in or around the home?: Yes If so, are there any without handrails?: Yes Home free of loose throw rugs in walkways, pet beds, electrical cords, etc?: Yes Adequate lighting in your home to reduce risk of falls?: Yes Life alert?: No Use of a cane, walker or w/c?: No Grab bars in the bathroom?: No Shower chair or bench in shower?: Yes Elevated toilet seat or a handicapped toilet?: No   Cognitive Function:        04/10/2023    3:15 PM 10/11/2021    2:45 PM 10/09/2019   11:21 AM 08/17/2017   10:53 AM  6CIT Screen  What Year? 0 points  0 points 0 points  What month? 0 points  0 points 0 points  What time? 0 points   0 points  Count back from 20 0 points   0 points  Months in reverse 0 points 0 points 0 points 0 points  Repeat phrase 0 points   0 points  Total Score  0 points   0 points    Immunizations Immunization History  Administered Date(s) Administered   Fluad Quad(high Dose 65+) 02/09/2022   Influenza-Unspecified 03/04/2014, 03/27/2021   PFIZER(Purple Top)SARS-COV-2 Vaccination 07/22/2019, 08/12/2019, 02/20/2020   Pfizer Covid-19 Vaccine Bivalent Booster 73yrs & up 03/27/2021   Pneumococcal Conjugate-13 01/15/2014   Pneumococcal-Unspecified 02/28/2013   Td 01/15/2014   Tdap 12/08/2016   Zoster, Live 01/02/2012    TDAP status: Up to date  Flu Vaccine status: Up  to date  Pneumococcal vaccine status: Due, Education has been provided regarding the importance of this vaccine. Advised may receive this vaccine at local pharmacy or Health Dept. Aware to provide a copy of the vaccination record if obtained from local pharmacy or Health Dept. Verbalized acceptance and understanding.  Covid-19 vaccine status: Completed vaccines  Qualifies for Shingles Vaccine? Yes   Zostavax completed Yes   Shingrix Completed?: No.    Education has been provided regarding the importance of this vaccine. Patient has been advised to call insurance company to determine out of pocket expense if they have not yet received this vaccine. Advised may also receive vaccine at local pharmacy or Health Dept. Verbalized acceptance and understanding.  Screening Tests Health Maintenance  Topic Date Due   Medicare Annual Wellness (AWV)  10/12/2022   Colonoscopy  01/07/2023   COVID-19 Vaccine (5 - 2023-24 season) 04/12/2023 (Originally 01/22/2023)   Zoster Vaccines- Shingrix (1 of 2) 06/27/2023 (Originally 04/28/1965)   Pneumonia Vaccine 60+ Years old (2 of 2 - PPSV23 or PCV20) 08/24/2023 (Originally 01/16/2015)   DTaP/Tdap/Td (3 - Td or Tdap) 12/09/2026   INFLUENZA VACCINE  Completed   DEXA SCAN  Completed   Hepatitis C Screening  Completed   HPV VACCINES  Aged Out    Health Maintenance  Health Maintenance Due  Topic Date Due   Medicare Annual Wellness (AWV)  10/12/2022   Colonoscopy  01/07/2023    Colorectal cancer screening: Type of screening: Colonoscopy. Completed 12/2019. Repeat every 3 years  Patient stated that she was told that she can go longer than the 3 years  Mammogram status: Completed 11/2022. Repeat every year  Bone Density status: Ordered 04/10/2023. Pt provided with contact info and advised to call to schedule appt.  Lung Cancer Screening: (Low Dose CT Chest recommended if Age 42-80 years, 20 pack-year currently smoking OR have quit w/in 15years.) does not qualify.     Additional Screening:  Hepatitis C Screening: does qualify; Completed 07/2016  Vision Screening: Recommended annual ophthalmology exams for early detection of glaucoma and other disorders of the eye. Is the patient up to date with their annual eye exam?  Yes  Who is the provider or what is the name of the office in which the patient attends annual eye exams? [facility] If pt is not established with a provider, would they like to be referred to a provider to establish care? No .   Dental Screening: Recommended annual dental exams for proper oral hygiene    Community Resource Referral / Chronic Care Management: CRR required this visit?  No   CCM required this visit?  No     Plan:     I have personally reviewed and noted the following in the patient's chart:   Medical and social history Use of alcohol, tobacco or illicit drugs  Current medications and supplements including opioid prescriptions. Patient is not currently taking opioid prescriptions. Functional ability and status Nutritional status Physical activity Advanced directives List of other physicians Hospitalizations, surgeries,  and ER visits in previous 12 months Vitals Screenings to include cognitive, depression, and falls Referrals and appointments  In addition, I have reviewed and discussed with patient certain preventive protocols, quality metrics, and best practice recommendations. A written personalized care plan for preventive services as well as general preventive health recommendations were provided to patient.     Sydell Axon, LPN   81/19/1478   After Visit Summary: (MyChart) Due to this being a telephonic visit, the after visit summary with patients personalized plan was offered to patient via MyChart   Nurse Notes: See routing comments

## 2023-04-11 NOTE — Telephone Encounter (Signed)
Modifier fixed.

## 2023-04-27 DIAGNOSIS — H353221 Exudative age-related macular degeneration, left eye, with active choroidal neovascularization: Secondary | ICD-10-CM | POA: Diagnosis not present

## 2023-04-27 DIAGNOSIS — H353112 Nonexudative age-related macular degeneration, right eye, intermediate dry stage: Secondary | ICD-10-CM | POA: Diagnosis not present

## 2023-04-27 DIAGNOSIS — H2513 Age-related nuclear cataract, bilateral: Secondary | ICD-10-CM | POA: Diagnosis not present

## 2023-05-08 ENCOUNTER — Ambulatory Visit (INDEPENDENT_AMBULATORY_CARE_PROVIDER_SITE_OTHER): Payer: Medicare Other | Admitting: Internal Medicine

## 2023-05-08 ENCOUNTER — Encounter (INDEPENDENT_AMBULATORY_CARE_PROVIDER_SITE_OTHER): Payer: Self-pay | Admitting: Internal Medicine

## 2023-05-08 VITALS — BP 135/73 | HR 62 | Temp 98.1°F | Ht 66.0 in | Wt 180.0 lb

## 2023-05-08 DIAGNOSIS — Z6829 Body mass index (BMI) 29.0-29.9, adult: Secondary | ICD-10-CM | POA: Diagnosis not present

## 2023-05-08 DIAGNOSIS — I10 Essential (primary) hypertension: Secondary | ICD-10-CM | POA: Diagnosis not present

## 2023-05-08 DIAGNOSIS — G4733 Obstructive sleep apnea (adult) (pediatric): Secondary | ICD-10-CM

## 2023-05-08 NOTE — Assessment & Plan Note (Addendum)
Because of slowing of her weight loss we repeated indirect calorimetry which showed a resting energy expenditure of 1411 cal this is normal and similar to calculated BMR.  So her metabolism has not slowed down with her weight loss.  I feel that there has been some loosening of her diet and she acknowledges increase calories from alcohol.  She will work on maintaining her weight during the holidays.  Her goal is to get down to 150 pounds next year.  She will continue Zepbound at 7.5 mg.  This is being prescribed by primary care team.

## 2023-05-08 NOTE — Progress Notes (Addendum)
Office: [phone]  /  Fax: [phone]  Weight Summary And Biometrics  Vitals Temp: 98.1 F (36.7 C) BP: 135/73 Pulse Rate: 62 SpO2: 98 %   Anthropometric Measurements Height: 5\' 6"  (1.676 m) Weight: 180 lb (81.6 kg) BMI (Calculated): 29.07 Weight at Last Visit: 181 lb Weight Lost Since Last Visit: 1 lb Weight Gained Since Last Visit: 0 lb Starting Weight: 224 lb Total Weight Loss (lbs): 46 lb (20.9 kg)   Body Composition  Body Fat %: 43.6 % Fat Mass (lbs): 96.8 lbs Muscle Mass (lbs): 96.8 lbs Total Body Water (lbs): 72.6 lbs Visceral Fat Rating : 13    RMR: 1411  Today's Visit #: 13  Starting Date: 12/23/21   Subjective   Chief Complaint: Obesity  Sherry Davila is here to discuss her progress with her obesity treatment plan. She is on the keeping a food journal and adhering to recommended goals of 1200 calories and 85 protein and states she is following her eating plan approximately 10 % of the time. She states she is not exercising.  Interval History:   Since last office visit she has lost 1 pounds. She reports fair adherence to reduced calorie nutritional plan. She has been working on not skipping meals, increasing protein intake at every meal, drinking more water, making healthier choices, reducing portion sizes, incorporating more whole foods, and acknowledges increased consumption of cocktails in the evenings with her husband.  She drinks about 3.    Orexigenic Control:  Denies problems with appetite and hunger signals.  Denies problems with satiety and satiation.  Denies problems with eating patterns and portion control.  Denies abnormal cravings. Denies feeling deprived or restricted.   Barriers identified: multiple competing priorities and low volume of physical activity at present .   Pharmacotherapy for weight loss: She is currently taking Zepbound with adequate clinical response  and without side effects..   Assessment and Plan   Treatment  Plan For Obesity:  Recommended Dietary Goals  Sherry Davila is currently in the action stage of change. As such, her goal is to continue weight management plan. She has agreed to: continue current plan  Behavioral Intervention  We discussed the following Behavioral Modification Strategies today: continue to work on maintaining a reduced calorie state, getting the recommended amount of protein, incorporating whole foods, making healthy choices, staying well hydrated and practicing mindfulness when eating. and reduce alcohol consumption .  Additional resources provided today: None  Recommended Physical Activity Goals  Sherry Davila has been advised to work up to 150 minutes of moderate intensity aerobic activity a week and strengthening exercises 2-3 times per week for cardiovascular health, weight loss maintenance and preservation of muscle mass.   She has agreed to :  Think about enjoyable ways to increase daily physical activity and overcoming barriers to exercise and Increase physical activity in their day and reduce sedentary time (increase NEAT).  Pharmacotherapy  We discussed various medication options to help Sherry Davila with her weight loss efforts and we both agreed to : continue current anti-obesity medication regimen  Associated Conditions Addressed Today  Essential hypertension Assessment & Plan: Repeat blood pressure was 135/73.  She is currently on Hyzaar, amlodipine and atenolol.  I recommend she check her blood pressure in the morning to make sure medication effect is not wearing off.  She will continue current regimen.  Patient also advised to reduce alcohol consumption   OSA on CPAP Assessment & Plan: Patient reports good adherence with CPAP therapy.  She has lost 20%  of total body weight and may benefit from a repeat sleep study to see if she still needs CPAP.  She has been drinking more alcohol than usual in the evenings which could have an effect on her OSA as well as weight loss  progress.  We recommend she reduce alcohol consumption.  Indirect calorimetry was completed today which showed a resting energy expenditure of 1411 cal with a VO2 of 205.  This is close to calculated.   Morbid obesity (HCC) with starting BMI 37 Assessment & Plan: Because of slowing of her weight loss we repeated indirect calorimetry which showed a resting energy expenditure of 1411 cal this is normal and similar to calculated BMR.  So her metabolism has not slowed down with her weight loss.  I feel that there has been some loosening of her diet and she acknowledges increase calories from alcohol.  She will work on maintaining her weight during the holidays.  Her goal is to get down to 150 pounds next year.  She will continue Zepbound at 7.5 mg.  This is being prescribed by primary care team.      Objective   Physical Exam:  Blood pressure 135/73, pulse 62, temperature 98.1 F (36.7 C), height 5\' 6"  (1.676 m), weight 180 lb (81.6 kg), SpO2 98%. Body mass index is 29.05 kg/m.  General: She is overweight, cooperative, alert, well developed, and in no acute distress. PSYCH: Has normal mood, affect and thought process.   HEENT: EOMI, sclerae are anicteric. Lungs: Normal breathing effort, no conversational dyspnea. Extremities: No edema.  Neurologic: No gross sensory or motor deficits. No tremors or fasciculations noted.    Diagnostic Data Reviewed:  BMET    Component Value Date/Time   NA 140 03/23/2023 0926   NA 140 04/09/2018 1126   K 4.5 03/23/2023 0926   CL 103 03/23/2023 0926   CO2 28 03/23/2023 0926   GLUCOSE 76 03/23/2023 0926   BUN 18 03/23/2023 0926   BUN 22 04/09/2018 1126   CREATININE 0.63 03/23/2023 0926   CALCIUM 9.2 03/23/2023 0926   GFRNONAA >60 01/18/2023 1000   GFRAA 108 04/09/2018 1126   Lab Results  Component Value Date   HGBA1C 5.0 03/23/2023   HGBA1C 5.6 10/07/2020   No results found for: "INSULIN" Lab Results  Component Value Date   TSH 2.30  03/23/2023   CBC    Component Value Date/Time   WBC 6.0 01/18/2023 1000   RBC 4.82 01/18/2023 1000   HGB 14.2 01/18/2023 1000   HGB 14.1 09/28/2017 1557   HCT 43.9 01/18/2023 1000   HCT 41.8 09/28/2017 1557   PLT 260 01/18/2023 1000   PLT 234 09/28/2017 1557   MCV 91.1 01/18/2023 1000   MCV 91 09/28/2017 1557   MCH 29.5 01/18/2023 1000   MCHC 32.3 01/18/2023 1000   RDW 14.3 01/18/2023 1000   RDW 14.6 09/28/2017 1557   Iron Studies No results found for: "IRON", "TIBC", "FERRITIN", "IRONPCTSAT" Lipid Panel     Component Value Date/Time   CHOL 217 (H) 03/23/2023 0926   CHOL 177 04/09/2018 1124   TRIG 60.0 03/23/2023 0926   TRIG 83 04/09/2018 1124   HDL 92.00 03/23/2023 0926   HDL 109 09/28/2017 1557   CHOLHDL 2 03/23/2023 0926   VLDL 12.0 03/23/2023 0926   VLDL 17 04/09/2018 1124   LDLCALC 113 (H) 03/23/2023 0926   LDLCALC 110 (H) 09/28/2017 1557   Hepatic Function Panel     Component Value Date/Time  PROT 6.3 03/23/2023 0926   PROT 7.0 09/28/2017 1557   ALBUMIN 4.0 03/23/2023 0926   ALBUMIN 4.7 09/28/2017 1557   AST 15 03/23/2023 0926   AST 28 04/09/2018 1124   ALT 13 03/23/2023 0926   ALT 29 04/09/2018 1124   ALKPHOS 96 03/23/2023 0926   BILITOT 0.6 03/23/2023 0926   BILITOT 0.4 09/28/2017 1557   BILIDIR 0.1 03/23/2023 0926      Component Value Date/Time   TSH 2.30 03/23/2023 0926   Nutritional Lab Results  Component Value Date   VD25OH 24.2 (L) 12/23/2021    Follow-Up   Return in about 4 weeks (around 06/05/2023) for For Weight Mangement with Dr. Rikki Spearing.Marland Kitchen She was informed of the importance of frequent follow up visits to maximize her success with intensive lifestyle modifications for her multiple health conditions.  Attestation Statement   Reviewed by clinician on day of visit: allergies, medications, problem list, medical history, surgical history, family history, social history, and previous encounter notes.   Note addendum 06/12/2023.  We  corrected entry under pharmacotherapy where I stated that patient was on Mounjaro with diabetes as a primary indication, this was due to a selection error in drop down menu.  Patient is on Zepbound 7.5 mg once a week not Mounjaro.  Worthy Rancher, MD

## 2023-05-08 NOTE — Assessment & Plan Note (Signed)
Repeat blood pressure was 135/73.  She is currently on Hyzaar, amlodipine and atenolol.  I recommend she check her blood pressure in the morning to make sure medication effect is not wearing off.  She will continue current regimen.  Patient also advised to reduce alcohol consumption

## 2023-05-08 NOTE — Assessment & Plan Note (Addendum)
Patient reports good adherence with CPAP therapy.  She has lost 20% of total body weight and may benefit from a repeat sleep study to see if she still needs CPAP.  She has been drinking more alcohol than usual in the evenings which could have an effect on her OSA as well as weight loss progress.  We recommend she reduce alcohol consumption.  Indirect calorimetry was completed today which showed a resting energy expenditure of 1411 cal with a VO2 of 205.  This is close to calculated.

## 2023-05-25 ENCOUNTER — Other Ambulatory Visit: Payer: Self-pay | Admitting: Internal Medicine

## 2023-06-01 ENCOUNTER — Encounter: Payer: Self-pay | Admitting: Internal Medicine

## 2023-06-02 NOTE — Telephone Encounter (Signed)
 Sherry Davila,  Can you help with this?

## 2023-06-02 NOTE — Telephone Encounter (Signed)
 Patient needs PA on Zepbound with dx of sleep apnea

## 2023-06-07 DIAGNOSIS — H353221 Exudative age-related macular degeneration, left eye, with active choroidal neovascularization: Secondary | ICD-10-CM | POA: Diagnosis not present

## 2023-06-09 ENCOUNTER — Other Ambulatory Visit (HOSPITAL_COMMUNITY): Payer: Self-pay

## 2023-06-09 ENCOUNTER — Telehealth: Payer: Self-pay

## 2023-06-09 NOTE — Telephone Encounter (Signed)
Pharmacy Patient Advocate Encounter   Received notification from Patient Advice Request messages that prior authorization for Zepbound 7.5MG /0.5ML pen-injectors is required/requested.   Insurance verification completed.   The patient is insured through [facility] .   Per test claim: PA required; PA submitted to above mentioned insurance via CoverMyMeds Key/confirmation #/EOC [facility] Status is pending

## 2023-06-09 NOTE — Telephone Encounter (Signed)
PA request has been Submitted. New Encounter created for follow up. For additional info see Pharmacy Prior Auth telephone encounter from 06/09/2023.

## 2023-06-09 NOTE — Telephone Encounter (Signed)
Patient aware.

## 2023-06-09 NOTE — Telephone Encounter (Signed)
Pharmacy Patient Advocate Encounter  Received notification from [facility] that Prior Authorization for  Zepbound 7.5MG /0.5ML pen-injectors  has been APPROVED from 06/09/23 to UNTIL FURTHER NOTICE. Ran test claim, Copay is $738.59. This test claim was processed through [facility]- copay amounts may vary at other pharmacies due to pharmacy/plan contracts, or as the patient moves through the different stages of their insurance plan.   PA #/Case ID/Reference #: [id number]

## 2023-06-13 ENCOUNTER — Encounter (INDEPENDENT_AMBULATORY_CARE_PROVIDER_SITE_OTHER): Payer: Self-pay

## 2023-06-13 ENCOUNTER — Ambulatory Visit (INDEPENDENT_AMBULATORY_CARE_PROVIDER_SITE_OTHER): Payer: Medicare Other | Admitting: Internal Medicine

## 2023-06-14 NOTE — Progress Notes (Unsigned)
SUBJECTIVE: Discussed the use of AI scribe software for clinical note transcription with the patient, who gave verbal consent to proceed.  Chief Complaint: Obesity  Interim History: She is down 4 lbs since her last visit with Dr. Rikki Spearing 05/08/23 Down 50 lbs overall ( ~39 lbs adipose/11 muscle mass) TBW loss of 22.3% from starting weight of 224 lbs 12/23/2021.  Her goal is 150 lbs this year.   Sherry Davila is a 78 year old female with a history of obesity, hypertension, and obstructive sleep apnea, presents for a follow-up visit regarding her obesity treatment plan. She has been on Zepbound 7.5 mg weekly and reports that it has been helpful in controlling her food cravings. She has also been on losartan/hydrochlorothiazide 100/12.5 mg daily, atenolol 25 mg daily, and amlodipine 10 mg daily for blood pressure management.  She has lost a total of 50 pounds since starting the treatment, with a recent loss of 4 pounds over the holiday period. She reports no side effects from the Zepbound, including no nausea, vomiting, diarrhea, constipation, difficulty swallowing, changes in mood, or changes in vision.  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 She reports no significant issues with this treatment.  She has been trying to maintain her muscle mass through a diet high in protein, consuming around 85 grams of protein per day. She has been adding shrimp to her salads and consuming Fairlife protein shakes. We discussed adding Fairlife milk to help meet protein needs as well.   She reports a long-standing habit of consuming three shots of gin nightly, which she has been doing since Christmas. She expresses no desire to stop this habit at the moment but is open to reducing her intake.  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 She has been trying to incorporate exercise into her routine, with plans to  return to the gym. She has been doing some strength training at home with 5-pound weights.  Sherry Davila is here to discuss her progress with her obesity treatment plan. She is on the keeping a food journal and adhering to recommended goals of 1200 calories and 85 grams of protein and states she is following her eating plan approximately 0 % of the time. She states she is not exercising 0 minutes 0 times per week.   OBJECTIVE: Visit Diagnoses: Problem List Items Addressed This Visit     Essential hypertension - Primary   OSA on CPAP   Other Visit Diagnoses       Alcohol use         Generalized obesity with starting BMI of 37         BMI 28.0-28.9,adult Current BMI 28.5         Obesity 78 year old on Zepbound 7.5 mg weekly, successfully lost 50 pounds (39 pounds adipose tissue). No side effects reported.  Maintaining muscle mass over past couple of months and working to consistently meet goal of 85 grams of protein daily.  Discussed Zepbound's benefits in reducing cravings and aiding weight loss. She does not feel it has had an effect on her alcohol intake.  - Continue Zepbound 7.5 mg weekly- prescribed by Dr. Lorin Picket - Encourage Fairlife milk for additional protein - Maintain protein intake at 85 grams per day - Continue strength training and cardio exercises- plans to resume regular training at the gym   Hypertension On losartan/hydrochlorothiazide 100/12.5 mg daily, atenolol 25 mg daily, and  amlodipine 10 mg daily for blood pressure management. BP improved over past 2 visits, nearing goal of < 130/80.  - Continue losartan/hydrochlorothiazide 100/12.5 mg daily, atenolol 25 mg daily, amlodipine 10 mg daily. No symptoms of hypotension.  Continue to work on nutrition plan to promote weight loss and improve BP control.  Monitor BP closely with continued treatment with Zepbound/continued weight loss.   Obstructive Sleep Apnea Using CPAP consistently. On Zepbound with significant weight loss as  noted. May eventually be able to repeat sleep study/possibly discontinue CPAP if appropriate.  - Continue CPAP as directed.  Continue Zepbound to promote further weight loss and consideration for repeat sleep study at some point to determine if ongoing CPAP is indicated.     Alcohol Use Consumes three shots of gin nightly. Discussed risks including liver damage, increased risk of breast cancer, etc. Advised gradual reduction to avoid abrupt changes. Discussed Zepbound's protective effects on the liver. - Advise gradual reduction of alcohol intake - Encourage alcohol-free days during the week  General Health Maintenance Discussed importance of healthy lifestyle, adequate protein intake, and regular exercise. Advised on benefits of Fairlife milk for protein supplementation. - Encourage regular exercise, including strength training and cardio - Recommend Fairlife milk for additional protein intake  Follow-up - Schedule follow-up with Dr. Rikki Spearing for July 24, 2023 at 12:20 PM. Patient reports trip upcoming to [place] and desires F/U in 6 wks.   Vitals Temp: 97.9 F (36.6 C) BP: 137/74 Pulse Rate: 68 SpO2: 99 %   Anthropometric Measurements Height: 5\' 6"  (1.676 m) Weight: 176 lb (79.8 kg) BMI (Calculated): 28.42 Weight at Last Visit: 180 lb Weight Lost Since Last Visit: 4 lb Weight Gained Since Last Visit: 0 Starting Weight: 224 lb Total Weight Loss (lbs): 50 lb (22.7 kg)   Body Composition  Body Fat %: 42.2 % Fat Mass (lbs): 74.6 lbs Muscle Mass (lbs): 96.8 lbs Total Body Water (lbs): 68.8 lbs Visceral Fat Rating : 12   Other Clinical Data RMR: 1411 Fasting: no Labs: no Today's Visit #: 14 Starting Date: 12/23/21     ASSESSMENT AND PLAN:  Diet: Sherry Davila is currently in the action stage of change. As such, her goal is to continue with weight loss efforts. She has agreed to keeping a food journal and adhering to recommended goals of 1200 calories and 85 grams of  protein.  Exercise: Sherry Davila has been instructed to work up to a goal of 150 minutes of combined cardio and strengthening exercise per week for weight loss and overall health benefits.   Behavior Modification:  We discussed the following Behavioral Modification Strategies today: increasing lean protein intake, decreasing simple carbohydrates, increasing vegetables, increase H2O intake, increase high fiber foods, no skipping meals, avoiding temptations, planning for success, and keep a strict food journal and reduce alcohol consumption.   We discussed various medication options to help Sherry Davila with her weight loss efforts and we both agreed to continue current plan.  Return in about 6 weeks (around 07/27/2023).Marland Kitchen She was informed of the importance of frequent follow up visits to maximize her success with intensive lifestyle modifications for her multiple health conditions.  Attestation Statements:   Reviewed by clinician on day of visit: allergies, medications, problem list, medical history, surgical history, family history, social history, and previous encounter notes.   Time spent on visit including pre-visit chart review and post-visit care and charting was 48 minutes.    Jermar Colter, PA-C

## 2023-06-15 ENCOUNTER — Ambulatory Visit (INDEPENDENT_AMBULATORY_CARE_PROVIDER_SITE_OTHER): Payer: Medicare Other | Admitting: Physician Assistant

## 2023-06-15 ENCOUNTER — Encounter (INDEPENDENT_AMBULATORY_CARE_PROVIDER_SITE_OTHER): Payer: Self-pay | Admitting: Physician Assistant

## 2023-06-15 VITALS — BP 137/74 | HR 68 | Temp 97.9°F | Ht 66.0 in | Wt 176.0 lb

## 2023-06-15 DIAGNOSIS — F1090 Alcohol use, unspecified, uncomplicated: Secondary | ICD-10-CM

## 2023-06-15 DIAGNOSIS — Z6828 Body mass index (BMI) 28.0-28.9, adult: Secondary | ICD-10-CM | POA: Diagnosis not present

## 2023-06-15 DIAGNOSIS — E669 Obesity, unspecified: Secondary | ICD-10-CM | POA: Diagnosis not present

## 2023-06-15 DIAGNOSIS — E559 Vitamin D deficiency, unspecified: Secondary | ICD-10-CM

## 2023-06-15 DIAGNOSIS — Z789 Other specified health status: Secondary | ICD-10-CM

## 2023-06-15 DIAGNOSIS — I10 Essential (primary) hypertension: Secondary | ICD-10-CM | POA: Diagnosis not present

## 2023-06-15 DIAGNOSIS — G4733 Obstructive sleep apnea (adult) (pediatric): Secondary | ICD-10-CM | POA: Diagnosis not present

## 2023-06-15 DIAGNOSIS — G720 Drug-induced myopathy: Secondary | ICD-10-CM

## 2023-06-20 ENCOUNTER — Other Ambulatory Visit: Payer: Self-pay | Admitting: *Deleted

## 2023-06-21 MED ORDER — ATENOLOL 25 MG PO TABS: 25.0000 mg | ORAL_TABLET | Freq: Every day | ORAL | 0 refills | Status: DC

## 2023-06-21 NOTE — Telephone Encounter (Signed)
Appointment scheduled for 07/27/23

## 2023-06-27 ENCOUNTER — Ambulatory Visit: Payer: Medicare Other | Admitting: Dermatology

## 2023-06-27 DIAGNOSIS — L814 Other melanin hyperpigmentation: Secondary | ICD-10-CM | POA: Diagnosis not present

## 2023-06-27 DIAGNOSIS — W908XXA Exposure to other nonionizing radiation, initial encounter: Secondary | ICD-10-CM | POA: Diagnosis not present

## 2023-06-27 DIAGNOSIS — C44622 Squamous cell carcinoma of skin of right upper limb, including shoulder: Secondary | ICD-10-CM | POA: Diagnosis not present

## 2023-06-27 DIAGNOSIS — L578 Other skin changes due to chronic exposure to nonionizing radiation: Secondary | ICD-10-CM | POA: Diagnosis not present

## 2023-06-27 DIAGNOSIS — D492 Neoplasm of unspecified behavior of bone, soft tissue, and skin: Secondary | ICD-10-CM | POA: Diagnosis not present

## 2023-06-27 DIAGNOSIS — C4492 Squamous cell carcinoma of skin, unspecified: Secondary | ICD-10-CM

## 2023-06-27 DIAGNOSIS — D489 Neoplasm of uncertain behavior, unspecified: Secondary | ICD-10-CM

## 2023-06-27 HISTORY — DX: Squamous cell carcinoma of skin, unspecified: C44.92

## 2023-06-27 NOTE — Patient Instructions (Addendum)
Electrodesiccation and Curettage ("Scrape and Burn") Wound Care Instructions  Leave the original bandage on for 24 hours if possible.  If the bandage becomes soaked or soiled before that time, it is OK to remove it and examine the wound.  A small amount of post-operative bleeding is normal.  If excessive bleeding occurs, remove the bandage, place gauze over the site and apply continuous pressure (no peeking) over the area for 30 minutes. If this does not work, please call our clinic as soon as possible or page your doctor if it is after hours.   Once a day, cleanse the wound with soap and water. It is fine to shower. If a thick crust develops you may use a Q-tip dipped into dilute hydrogen peroxide (mix 1:1 with water) to dissolve it.  Hydrogen peroxide can slow the healing process, so use it only as needed.    After washing, apply petroleum jelly (Vaseline) or an antibiotic ointment if your doctor prescribed one for you, followed by a bandage.    For best healing, the wound should be covered with a layer of ointment at all times. If you are not able to keep the area covered with a bandage to hold the ointment in place, this may mean re-applying the ointment several times a day.  Continue this wound care until the wound has healed and is no longer open. It may take several weeks for the wound to heal and close.  Itching and mild discomfort is normal during the healing process.  If you have any discomfort, you can take Tylenol (acetaminophen) or ibuprofen as directed on the bottle. (Please do not take these if you have an allergy to them or cannot take them for another reason).  Some redness, tenderness and white or yellow material in the wound is normal healing.  If the area becomes very sore and red, or develops a thick yellow-green material (pus), it may be infected; please notify us.    Wound healing continues for up to one year following surgery. It is not unusual to experience pain in the scar  from time to time during the interval.  If the pain becomes severe or the scar thickens, you should notify the office.    A slight amount of redness in a scar is expected for the first six months.  After six months, the redness will fade and the scar will soften and fade.  The color difference becomes less noticeable with time.  If there are any problems, return for a post-op surgery check at your earliest convenience.  To improve the appearance of the scar, you can use silicone scar gel, cream, or sheets (such as Mederma or Serica) every night for up to one year. These are available over the counter (without a prescription).  Please call our office at [phone] for any questions or concerns.   Biopsy Wound Care Instructions  Leave the original bandage on for 24 hours if possible.  If the bandage becomes soaked or soiled before that time, it is OK to remove it and examine the wound.  A small amount of post-operative bleeding is normal.  If excessive bleeding occurs, remove the bandage, place gauze over the site and apply continuous pressure (no peeking) over the area for 30 minutes. If this does not work, please call our clinic as soon as possible or page your doctor if it is after hours.   Once a day, cleanse the wound with soap and water. It is fine to shower.  If a thick crust develops you may use a Q-tip dipped into dilute hydrogen peroxide (mix 1:1 with water) to dissolve it.  Hydrogen peroxide can slow the healing process, so use it only as needed.    After washing, apply petroleum jelly (Vaseline) or an antibiotic ointment if your doctor prescribed one for you, followed by a bandage.    For best healing, the wound should be covered with a layer of ointment at all times. If you are not able to keep the area covered with a bandage to hold the ointment in place, this may mean re-applying the ointment several times a day.  Continue this wound care until the wound has healed and is no longer  open.   Itching and mild discomfort is normal during the healing process. However, if you develop pain or severe itching, please call our office.   If you have any discomfort, you can take Tylenol (acetaminophen) or ibuprofen as directed on the bottle. (Please do not take these if you have an allergy to them or cannot take them for another reason).  Some redness, tenderness and white or yellow material in the wound is normal healing.  If the area becomes very sore and red, or develops a thick yellow-green material (pus), it may be infected; please notify us.    If you have stitches, return to clinic as directed to have the stitches removed. You will continue wound care for 2-3 days after the stitches are removed.   Wound healing continues for up to one year following surgery. It is not unusual to experience pain in the scar from time to time during the interval.  If the pain becomes severe or the scar thickens, you should notify the office.    A slight amount of redness in a scar is expected for the first six months.  After six months, the redness will fade and the scar will soften and fade.  The color difference becomes less noticeable with time.  If there are any problems, return for a post-op surgery check at your earliest convenience.  To improve the appearance of the scar, you can use silicone scar gel, cream, or sheets (such as Mederma or Serica) every night for up to one year. These are available over the counter (without a prescription).  Please call our office at [phone] for any questions or concerns.       Due to recent changes in healthcare laws, you may see results of your pathology and/or laboratory studies on MyChart before the doctors have had a chance to review them. We understand that in some cases there may be results that are confusing or concerning to you. Please understand that not all results are received at the same time and often the doctors may need to interpret  multiple results in order to provide you with the best plan of care or course of treatment. Therefore, we ask that you please give [place] 2 business days to thoroughly review all your results before contacting the office for clarification. Should we see a critical lab result, you will be contacted sooner.   If You Need Anything After Your Visit  If you have any questions or concerns for your doctor, please call our main line at [phone] and press option 4 to reach your doctor's medical assistant. If no one answers, please leave a voicemail as directed and we will return your call as soon as possible. Messages left after 4 pm will be answered the following business day.  You may also send [place] a message via MyChart. We typically respond to MyChart messages within 1-2 business days.  For prescription refills, please ask your pharmacy to contact our office. Our fax number is [phone].  If you have an urgent issue when the clinic is closed that cannot wait until the next business day, you can page your doctor at the number below.    Please note that while we do our best to be available for urgent issues outside of office hours, we are not available 24/7.   If you have an urgent issue and are unable to reach [place], you may choose to seek medical care at your doctor's office, retail clinic, urgent care center, or emergency room.  If you have a medical emergency, please immediately call 911 or go to the emergency department.  Pager Numbers  - Dr. Gwen Pounds: [phone]  - Dr. Roseanne Reno: [phone]  - Dr. Katrinka Blazing: [phone]   In the event of inclement weather, please call our main line at [phone] for an update on the status of any delays or closures.  Dermatology Medication Tips: Please keep the boxes that topical medications come in in order to help keep track of the instructions about where and how to use these. Pharmacies typically print the medication instructions only on the boxes  and not directly on the medication tubes.   If your medication is too expensive, please contact our office at [phone] option 4 or send [place] a message through MyChart.   We are unable to tell what your co-pay for medications will be in advance as this is different depending on your insurance coverage. However, we may be able to find a substitute medication at lower cost or fill out paperwork to get insurance to cover a needed medication.   If a prior authorization is required to get your medication covered by your insurance company, please allow [place] 1-2 business days to complete this process.  Drug prices often vary depending on where the prescription is filled and some pharmacies may offer cheaper prices.  The website www.goodrx.com contains coupons for medications through different pharmacies. The prices here do not account for what the cost may be with help from insurance (it may be cheaper with your insurance), but the website can give you the price if you did not use any insurance.  - You can print the associated coupon and take it with your prescription to the pharmacy.  - You may also stop by our office during regular business hours and pick up a GoodRx coupon card.  - If you need your prescription sent electronically to a different pharmacy, notify our office through [facility] or by phone at [phone] option 4.     Si Usted Necesita Algo Despus de Su Visita  Tambin puede enviarnos un mensaje a travs de Clinical cytogeneticist. Por lo general respondemos a los mensajes de MyChart en el transcurso de 1 a 2 das hbiles.  Para renovar recetas, por favor pida a su farmacia que se ponga en contacto con nuestra oficina. Annie Sable de fax es [place] [phone].  Si tiene un asunto urgente cuando la clnica est cerrada y que no puede esperar hasta el siguiente da hbil, puede llamar/localizar a su doctor(a) al nmero que aparece a continuacin.   Por favor, tenga en cuenta que aunque  hacemos todo lo posible para estar disponibles para asuntos urgentes fuera del horario de [place], no estamos disponibles las 24 horas del da, los 7 [address]  [address] de la [place].  Si tiene un problema urgente y no puede comunicarse con nosotros, puede optar por buscar atencin mdica  en el consultorio de su doctor(a), en una clnica privada, en un centro de atencin urgente o en una sala de emergencias.  Si tiene Engineer, drilling, por favor llame inmediatamente al 911 o vaya a la sala de emergencias.  Nmeros de bper  - Dr. Gwen Pounds: [phone]  - Dra. Roseanne Reno: [phone]  - Dr. Katrinka Blazing: [phone]   En caso de inclemencias del tiempo, por favor llame a Lacy Duverney principal al [phone] para una actualizacin sobre el [place] de cualquier retraso o cierre.  Consejos para la medicacin en dermatologa: Por favor, guarde las cajas en las que vienen los medicamentos de uso tpico para ayudarle a seguir las instrucciones sobre dnde y cmo usarlos. Las farmacias generalmente imprimen las instrucciones del medicamento slo en las cajas y no directamente en los tubos del [place].   Si su medicamento es muy caro, por favor, pngase en contacto con Rolm Gala llamando al [phone] y presione la opcin 4 o envenos un mensaje a travs de Clinical cytogeneticist.   No podemos decirle cul ser su copago por los medicamentos por adelantado ya que esto es diferente dependiendo de la cobertura de su seguro. Sin embargo, es posible que podamos encontrar un medicamento sustituto a Audiological scientist un formulario para que el seguro cubra el medicamento que se considera necesario.   Si se requiere una autorizacin previa para que su compaa de seguros [place] su medicamento, por favor permtanos de 1 a 2 das hbiles para completar [address]  Los precios de los medicamentos varan con frecuencia dependiendo del Environmental consultant de dnde se surte la receta y alguna farmacias pueden ofrecer precios ms  baratos.  El sitio web www.goodrx.com tiene cupones para medicamentos de Health and safety inspector. Los precios aqu no tienen en cuenta lo que podra costar con la ayuda del seguro (puede ser ms barato con su seguro), pero el sitio web puede darle el precio si no utiliz Tourist information centre manager.  - Puede imprimir el cupn correspondiente y llevarlo con su receta a la farmacia.  - Tambin puede pasar por nuestra oficina durante el horario de atencin regular y Education officer, museum una tarjeta de cupones de GoodRx.  - Si necesita que su receta se enve electrnicamente a una farmacia diferente, informe a nuestra oficina a travs de MyChart de [place] o por telfono llamando al [phone] y presione la opcin 4.

## 2023-06-27 NOTE — Progress Notes (Signed)
   Follow-Up Visit   Subjective  Sherry Davila is a 78 y.o. female who presents for the following:  spot at right arm notice about a month or more,  reports that it has been a little painful.    The patient has spots, moles and lesions to be evaluated, some may be new or changing and the patient may have concern these could be cancer.   The following portions of the chart were reviewed this encounter and updated as appropriate: medications, allergies, medical history  Review of Systems:  No other skin or systemic complaints except as noted in HPI or Assessment and Plan.  Objective  Well appearing patient in no apparent distress; mood and affect are within normal limits.   A focused examination was performed of the following areas: Right arm   Relevant exam findings are noted in the Assessment and Plan.  right upper arm above elbow 1.2 cm firm violaceous crusted nodule     Assessment & Plan   ACTINIC DAMAGE - chronic, secondary to cumulative UV radiation exposure/sun exposure over time - diffuse scaly erythematous macules with underlying dyspigmentation - Recommend daily broad spectrum sunscreen SPF 30+ to sun-exposed areas, reapply every 2 hours as needed.  - Recommend staying in the shade or wearing long sleeves, sun glasses (UVA+UVB protection) and wide brim hats (4-inch brim around the entire circumference of the hat). - Call for new or changing lesions.   LENTIGINES Exam: scattered tan macules Due to sun exposure Treatment Plan: Benign-appearing, observe. Recommend daily broad spectrum sunscreen SPF 30+ to sun-exposed areas, reapply every 2 hours as needed.  Call for any changes   NEOPLASM OF UNCERTAIN BEHAVIOR right upper arm above elbow Epidermal / dermal shaving  Lesion diameter (cm):  1.2 Informed consent: discussed and consent obtained   Patient was prepped and draped in usual sterile fashion: Area prepped with alcohol. Anesthesia: the lesion was  anesthetized in a standard fashion   Anesthetic:  1% lidocaine  w/ epinephrine 1-100,000 buffered w/ 8.4% NaHCO3 Instrument used: flexible razor blade   Hemostasis achieved with: pressure, aluminum chloride and electrodesiccation   Outcome: patient tolerated procedure well    Destruction of lesion  Destruction method: electrodesiccation and curettage   Informed consent: discussed and consent obtained   Curettage performed in three different directions: Yes   Electrodesiccation performed over the curetted area: Yes   Final wound size (cm):  1.3 Hemostasis achieved with:  pressure, aluminum chloride and electrodesiccation Outcome: patient tolerated procedure well with no complications   Post-procedure details: wound care instructions given   Additional details:  Mupirocin ointment and Bandaid applied  Specimen 1 - Surgical pathology Differential Diagnosis: r/o scc  Check Margins: No  ED&C done today   R/o scc  ED&C done today   Return for Keep follow up as scheduled in April with Dr. LOIS LILLETTE Eleanor Tanda, CMA, am acting as scribe for Rexene Rattler, MD.   Documentation: I have reviewed the above documentation for accuracy and completeness, and I agree with the above.  Rexene Rattler, MD

## 2023-06-28 ENCOUNTER — Other Ambulatory Visit: Payer: Self-pay | Admitting: Internal Medicine

## 2023-06-30 LAB — SURGICAL PATHOLOGY

## 2023-07-03 ENCOUNTER — Telehealth: Payer: Self-pay

## 2023-07-03 NOTE — Telephone Encounter (Signed)
 Left message on patient's voicemail that I was returning her call after she left a voicemail returning Melanie's call. I left voicemail stating that Prentice Brochure was trying to contact her about her pathology results which could also be found on MyChart and to return our call.

## 2023-07-03 NOTE — Telephone Encounter (Signed)
Left message for patient to call for biopsy results. 

## 2023-07-03 NOTE — Telephone Encounter (Signed)
-----   Message from Willeen Niece sent at 07/03/2023 12:47 PM EST ----- 1. Skin, right upper arm above elbow :       WELL DIFFERENTIATED SQUAMOUS CELL CARCINOMA   SCC skin cancer- already treated with EDC at time of biopsy   - please call patient

## 2023-07-04 ENCOUNTER — Telehealth: Payer: Self-pay

## 2023-07-04 NOTE — Telephone Encounter (Signed)
-----   Message from Willeen Niece sent at 07/03/2023 12:47 PM EST ----- 1. Skin, right upper arm above elbow :       WELL DIFFERENTIATED SQUAMOUS CELL CARCINOMA   SCC skin cancer- already treated with EDC at time of biopsy   - please call patient

## 2023-07-04 NOTE — Telephone Encounter (Signed)
Advised pt of bx results/sh ?

## 2023-07-05 DIAGNOSIS — H353221 Exudative age-related macular degeneration, left eye, with active choroidal neovascularization: Secondary | ICD-10-CM | POA: Diagnosis not present

## 2023-07-05 DIAGNOSIS — H353112 Nonexudative age-related macular degeneration, right eye, intermediate dry stage: Secondary | ICD-10-CM | POA: Diagnosis not present

## 2023-07-05 DIAGNOSIS — H43813 Vitreous degeneration, bilateral: Secondary | ICD-10-CM | POA: Diagnosis not present

## 2023-07-05 DIAGNOSIS — H2513 Age-related nuclear cataract, bilateral: Secondary | ICD-10-CM | POA: Diagnosis not present

## 2023-07-21 ENCOUNTER — Other Ambulatory Visit: Payer: Self-pay | Admitting: *Deleted

## 2023-07-21 ENCOUNTER — Other Ambulatory Visit: Payer: Self-pay

## 2023-07-21 DIAGNOSIS — R739 Hyperglycemia, unspecified: Secondary | ICD-10-CM

## 2023-07-21 DIAGNOSIS — I10 Essential (primary) hypertension: Secondary | ICD-10-CM

## 2023-07-21 DIAGNOSIS — E785 Hyperlipidemia, unspecified: Secondary | ICD-10-CM

## 2023-07-24 ENCOUNTER — Other Ambulatory Visit (INDEPENDENT_AMBULATORY_CARE_PROVIDER_SITE_OTHER): Payer: Medicare Other

## 2023-07-24 ENCOUNTER — Ambulatory Visit (INDEPENDENT_AMBULATORY_CARE_PROVIDER_SITE_OTHER): Payer: Medicare Other | Admitting: Internal Medicine

## 2023-07-24 ENCOUNTER — Encounter (INDEPENDENT_AMBULATORY_CARE_PROVIDER_SITE_OTHER): Payer: Self-pay | Admitting: Internal Medicine

## 2023-07-24 VITALS — BP 138/70 | HR 71 | Temp 98.0°F | Ht 66.0 in | Wt 175.0 lb

## 2023-07-24 DIAGNOSIS — E785 Hyperlipidemia, unspecified: Secondary | ICD-10-CM | POA: Diagnosis not present

## 2023-07-24 DIAGNOSIS — R739 Hyperglycemia, unspecified: Secondary | ICD-10-CM | POA: Diagnosis not present

## 2023-07-24 DIAGNOSIS — I10 Essential (primary) hypertension: Secondary | ICD-10-CM | POA: Diagnosis not present

## 2023-07-24 DIAGNOSIS — G4733 Obstructive sleep apnea (adult) (pediatric): Secondary | ICD-10-CM

## 2023-07-24 DIAGNOSIS — R638 Other symptoms and signs concerning food and fluid intake: Secondary | ICD-10-CM

## 2023-07-24 DIAGNOSIS — Z6828 Body mass index (BMI) 28.0-28.9, adult: Secondary | ICD-10-CM

## 2023-07-24 LAB — BASIC METABOLIC PANEL
BUN: 18 mg/dL (ref 6–23)
CO2: 28 meq/L (ref 19–32)
Calcium: 9.3 mg/dL (ref 8.4–10.5)
Chloride: 103 meq/L (ref 96–112)
Creatinine, Ser: 0.52 mg/dL (ref 0.40–1.20)
GFR: 90.37 mL/min (ref >60.00)
Glucose, Bld: 85 mg/dL (ref 70–99)
Potassium: 4.3 meq/L (ref 3.5–5.1)
Sodium: 139 meq/L (ref 135–145)

## 2023-07-24 LAB — HEPATIC FUNCTION PANEL
ALT: 14 U/L (ref 0–35)
AST: 16 U/L (ref 0–37)
Albumin: 4.1 g/dL (ref 3.5–5.2)
Alkaline Phosphatase: 78 U/L (ref 39–117)
Bilirubin, Direct: 0.1 mg/dL (ref 0.0–0.3)
Total Bilirubin: 0.6 mg/dL (ref 0.2–1.2)
Total Protein: 6.7 g/dL (ref 6.0–8.3)

## 2023-07-24 LAB — LIPID PANEL
Cholesterol: 224 mg/dL — ABNORMAL HIGH (ref 0–200)
HDL: 94.6 mg/dL (ref >39.00)
LDL Cholesterol: 115 mg/dL — ABNORMAL HIGH (ref 0–99)
NonHDL: 129.22
Total CHOL/HDL Ratio: 2
Triglycerides: 72 mg/dL (ref 0.0–149.0)
VLDL: 14.4 mg/dL (ref 0.0–40.0)

## 2023-07-24 LAB — HEMOGLOBIN A1C: Hgb A1c MFr Bld: 4.9 % (ref 4.6–6.5)

## 2023-07-24 NOTE — Assessment & Plan Note (Signed)
 Patient reports good adherence with CPAP therapy.  She has lost 20% of total body weight and may benefit from a repeat sleep study to see if she still needs CPAP.  Continue treatment with Zepbound for weight management

## 2023-07-24 NOTE — Assessment & Plan Note (Signed)
 Patient presents for medical weight management. She has lost one pound since the last visit. Adheres to a reduced calorie nutrition plan 10-20% of the time, tracks calories, consumes more whole foods, and maintains adequate protein intake and hydration. She gets 7-9 hours of sleep per night but is currently not exercising. On Zepbound 7.5 mg, considering increasing the dose due to breakthrough hunger. Metabolic rate is 1478 calories; needs to reduce intake to 1100-1200 calories for weight loss. Discussed the importance of tracking protein intake and a Mediterranean-inspired diet. Explained that 1100-1200 calories/day can result in a weight loss of 2-3 pounds/month. Emphasized avoiding overly restrictive diets to prevent malnutrition and muscle loss. Discussed benefits of increasing physical activity, including Pilates, walking the dog, and gym visits for cardio and weight lifting once a week. - Provide a 7-day meal plan targeting 1200 calories/day, high in protein, Mediterranean-inspired - Encourage tracking protein intake (30 grams per meal) - Recommend increasing physical activity, including Pilates and walking the dog - Suggest visiting the gym for cardio and weight lifting once a week - Discuss with PCP about increasing Zepbound dose to 10 mg if breakthrough hunger persists - Follow up in 4 weeks to review progress and blood work results

## 2023-07-24 NOTE — Assessment & Plan Note (Signed)
 Improved on Zepbound but experiencing some breakthrough hunger.  Might be due to shift in macros. She has increased orexigenic signaling, impaired satiety and inhibitory control. This is secondary to an abnormal energy regulation system and pathological neurohormonal pathways characteristic of excess adiposity.  In addition to nutritional and behavioral strategies she benefits from pharmacotherapy.  Patient will have her primary care team adjust Zepbound

## 2023-07-24 NOTE — Progress Notes (Signed)
 Office: [phone]  /  Fax: [phone]  Weight Summary And Biometrics  Vitals Temp: 98 F (36.7 C) BP: 138/70 Pulse Rate: 71 SpO2: 98 %   Anthropometric Measurements Height: 5\' 6"  (1.676 m) Weight: 175 lb (79.4 kg) BMI (Calculated): 28.26 Weight at Last Visit: 176 lb Weight Lost Since Last Visit: 1 lb Weight Gained Since Last Visit: 0 lb Starting Weight: 224 lb Total Weight Loss (lbs): 51 lb (23.1 kg)   Body Composition  Body Fat %: 42.2 % Fat Mass (lbs): 74 lbs Muscle Mass (lbs): 96 lbs Total Body Water (lbs): 69.2 lbs Visceral Fat Rating : 12    RMR: 1411  Today's Visit #: 15  Starting Date: 12/23/21   Subjective   Chief Complaint: Obesity  Interval History Discussed the use of AI scribe software for clinical note transcription with the patient, who gave verbal consent to proceed.  History of Present Illness   Sherry Davila is a 78 year old female who presents for medical weight management.  She has been part of the weight management program since August 2023 and has lost approximately 50 pounds since starting. Since her last visit, she has lost one pound. She attributes much of her success to the medication Zepbound, which has helped her manage her appetite. She is following a reduced calorie nutrition plan but only adheres to it 10-20% of the time during recent travel.  She is currently on a 7.5 mg dose of Zepbound and experiences occasional breakthrough hunger. She has not been exercising regularly due to recent travel and family events but plans to join a Pilates group in March. She walks her dog two to three times a week for about a mile each time and is a member of [employer], where she engages in cardio and weightlifting when she is active.  Zepbound is managed by her primary care team.  Nutritionally, she focuses on high protein intake and prefers vegetables over fruits. She does not like fruits but occasionally eats them in salads or  with cottage cheese. She is mindful of her calorie intake, especially from healthy fats like avocado and olive oil, and uses a homemade dressing with more vinegar than oil to keep calories low.  She gets seven to nine hours of sleep per night and is not experiencing high levels of stress. She recently traveled to [place] and [place], which may have impacted her routine.         Challenges affecting patient progress: low volume of physical activity at present .    Pharmacotherapy for weight management: She is currently taking Zepbound with adequate clinical response  and without side effects..   Assessment and Plan   Treatment Plan For Obesity:  Recommended Dietary Goals  Sherry Davila is currently in the action stage of change. As such, her goal is to continue weight management plan. She has agreed to: follow a tailored, multi-day, low carbohydrate, high protein plan targeting 1200 calories and 90-100 grams of protein per day  Behavioral Health and Counseling  We discussed the following behavioral modification strategies today: increasing lean protein intake to established goals, increasing vegetables, increasing lower glycemic fruits, and continue to work on implementation of reduced calorie nutritional plan.  Additional education and resources provided today: New meal plan provided  Recommended Physical Activity Goals  Sherry Davila has been advised to work up to 150 minutes of moderate intensity aerobic activity a week and strengthening exercises 2-3 times per week for cardiovascular health, weight loss maintenance and preservation  of muscle mass.   She has agreed to :  Think about enjoyable ways to increase daily physical activity and overcoming barriers to exercise and Increase physical activity in their day and reduce sedentary time (increase NEAT).  Pharmacotherapy  We discussed various medication options to help Sherry Davila with her weight loss efforts and we both agreed to : adequate clinical  response to current dose, continue current regimen and Zepbound is being adjusted by primary care team  Associated Conditions Impacted by Obesity Treatment  OSA on CPAP Assessment & Plan: Patient reports good adherence with CPAP therapy.  She has lost 20% of total body weight and may benefit from a repeat sleep study to see if she still needs CPAP.  Continue treatment with Zepbound for weight management    Abnormal food appetite Assessment & Plan: Improved on Zepbound but experiencing some breakthrough hunger.  Might be due to shift in macros. She has increased orexigenic signaling, impaired satiety and inhibitory control. This is secondary to an abnormal energy regulation system and pathological neurohormonal pathways characteristic of excess adiposity.  In addition to nutritional and behavioral strategies she benefits from pharmacotherapy.  Patient will have her primary care team adjust Zepbound    Morbid obesity (HCC) with starting BMI 37 Assessment & Plan: Patient presents for medical weight management. She has lost one pound since the last visit. Adheres to a reduced calorie nutrition plan 10-20% of the time, tracks calories, consumes more whole foods, and maintains adequate protein intake and hydration. She gets 7-9 hours of sleep per night but is currently not exercising. On Zepbound 7.5 mg, considering increasing the dose due to breakthrough hunger. Metabolic rate is 1610 calories; needs to reduce intake to 1100-1200 calories for weight loss. Discussed the importance of tracking protein intake and a Mediterranean-inspired diet. Explained that 1100-1200 calories/day can result in a weight loss of 2-3 pounds/month. Emphasized avoiding overly restrictive diets to prevent malnutrition and muscle loss. Discussed benefits of increasing physical activity, including Pilates, walking the dog, and gym visits for cardio and weight lifting once a week. - Provide a 7-day meal plan targeting 1200  calories/day, high in protein, Mediterranean-inspired - Encourage tracking protein intake (30 grams per meal) - Recommend increasing physical activity, including Pilates and walking the dog - Suggest visiting the gym for cardio and weight lifting once a week - Discuss with PCP about increasing Zepbound dose to 10 mg if breakthrough hunger persists - Follow up in 4 weeks to review progress and blood work results          Objective   Physical Exam:  Blood pressure 138/70, pulse 71, temperature 98 F (36.7 C), height 5\' 6"  (1.676 m), weight 175 lb (79.4 kg), SpO2 98%. Body mass index is 28.25 kg/m.  General: She is overweight, cooperative, alert, well developed, and in no acute distress. PSYCH: Has normal mood, affect and thought process.   HEENT: EOMI, sclerae are anicteric. Lungs: Normal breathing effort, no conversational dyspnea. Extremities: No edema.  Neurologic: No gross sensory or motor deficits. No tremors or fasciculations noted.    Diagnostic Data Reviewed:  BMET    Component Value Date/Time   NA 140 03/23/2023 0926   NA 140 04/09/2018 1126   K 4.5 03/23/2023 0926   CL 103 03/23/2023 0926   CO2 28 03/23/2023 0926   GLUCOSE 76 03/23/2023 0926   BUN 18 03/23/2023 0926   BUN 22 04/09/2018 1126   CREATININE 0.63 03/23/2023 0926   CALCIUM 9.2 03/23/2023 0926  GFRNONAA >60 01/18/2023 1000   GFRAA 108 04/09/2018 1126   Lab Results  Component Value Date   HGBA1C 5.0 03/23/2023   HGBA1C 5.6 10/07/2020   No results found for: "INSULIN" Lab Results  Component Value Date   TSH 2.30 03/23/2023   CBC    Component Value Date/Time   WBC 6.0 01/18/2023 1000   RBC 4.82 01/18/2023 1000   HGB 14.2 01/18/2023 1000   HGB 14.1 09/28/2017 1557   HCT 43.9 01/18/2023 1000   HCT 41.8 09/28/2017 1557   PLT 260 01/18/2023 1000   PLT 234 09/28/2017 1557   MCV 91.1 01/18/2023 1000   MCV 91 09/28/2017 1557   MCH 29.5 01/18/2023 1000   MCHC 32.3 01/18/2023 1000   RDW  14.3 01/18/2023 1000   RDW 14.6 09/28/2017 1557   Iron Studies No results found for: "IRON", "TIBC", "FERRITIN", "IRONPCTSAT" Lipid Panel     Component Value Date/Time   CHOL 217 (H) 03/23/2023 0926   CHOL 177 04/09/2018 1124   TRIG 60.0 03/23/2023 0926   TRIG 83 04/09/2018 1124   HDL 92.00 03/23/2023 0926   HDL 109 09/28/2017 1557   CHOLHDL 2 03/23/2023 0926   VLDL 12.0 03/23/2023 0926   VLDL 17 04/09/2018 1124   LDLCALC 113 (H) 03/23/2023 0926   LDLCALC 110 (H) 09/28/2017 1557   Hepatic Function Panel     Component Value Date/Time   PROT 6.3 03/23/2023 0926   PROT 7.0 09/28/2017 1557   ALBUMIN 4.0 03/23/2023 0926   ALBUMIN 4.7 09/28/2017 1557   AST 15 03/23/2023 0926   AST 28 04/09/2018 1124   ALT 13 03/23/2023 0926   ALT 29 04/09/2018 1124   ALKPHOS 96 03/23/2023 0926   BILITOT 0.6 03/23/2023 0926   BILITOT 0.4 09/28/2017 1557   BILIDIR 0.1 03/23/2023 0926      Component Value Date/Time   TSH 2.30 03/23/2023 0926   Nutritional Lab Results  Component Value Date   VD25OH 24.2 (L) 12/23/2021    Medications: Outpatient Encounter Medications as of 07/24/2023  Medication Sig   amLODipine (NORVASC) 10 MG tablet TAKE 1 TABLET BY MOUTH DAILY   atenolol (TENORMIN) 25 MG tablet Take 1 tablet (25 mg total) by mouth daily.   ezetimibe (ZETIA) 10 MG tablet Take 1 tablet (10 mg total) by mouth daily.   FLUoxetine (PROZAC) 40 MG capsule TAKE 1 CAPSULE BY MOUTH DAILY   losartan-hydrochlorothiazide (HYZAAR) 100-12.5 MG tablet TAKE 1 TABLET BY MOUTH DAILY   ZEPBOUND 7.5 MG/0.5ML Pen INJECT 1 DOSE (7.5MG ) SUBCUTANEOUSLY ONCE A WEEK   No facility-administered encounter medications on file as of 07/24/2023.     Follow-Up   Return in about 4 weeks (around 08/21/2023) for For Weight Mangement with Dr. Rikki Spearing.Marland Kitchen She was informed of the importance of frequent follow up visits to maximize her success with intensive lifestyle modifications for her multiple health  conditions.  Attestation Statement   Reviewed by clinician on day of visit: allergies, medications, problem list, medical history, surgical history, family history, social history, and previous encounter notes.    I have spent 30 minutes in the care of the patient today including: preparing to see patient (e.g. review and interpretation of tests, old notes ), obtaining and/or reviewing separately obtained history, performing a medically appropriate examination or evaluation, counseling and educating the patient, documenting clinical information in the electronic or other health care record, and independently interpreting results and communicating results to the patient, family, or caregiver  Worthy Rancher,  MD

## 2023-07-25 ENCOUNTER — Encounter: Payer: Self-pay | Admitting: Internal Medicine

## 2023-07-25 NOTE — Telephone Encounter (Signed)
 If she is taking and tolerating 7.5mg , I am ok to increase dose to 10mg .  I can also discuss with her more at her appt.

## 2023-07-26 ENCOUNTER — Ambulatory Visit (INDEPENDENT_AMBULATORY_CARE_PROVIDER_SITE_OTHER): Payer: Medicare Other | Admitting: Internal Medicine

## 2023-07-26 ENCOUNTER — Encounter: Payer: Self-pay | Admitting: Internal Medicine

## 2023-07-26 VITALS — BP 128/82 | HR 71 | Temp 98.0°F | Ht 66.0 in | Wt 179.4 lb

## 2023-07-26 DIAGNOSIS — T466X5A Adverse effect of antihyperlipidemic and antiarteriosclerotic drugs, initial encounter: Secondary | ICD-10-CM

## 2023-07-26 DIAGNOSIS — G720 Drug-induced myopathy: Secondary | ICD-10-CM

## 2023-07-26 DIAGNOSIS — I10 Essential (primary) hypertension: Secondary | ICD-10-CM | POA: Diagnosis not present

## 2023-07-26 DIAGNOSIS — G4733 Obstructive sleep apnea (adult) (pediatric): Secondary | ICD-10-CM | POA: Diagnosis not present

## 2023-07-26 DIAGNOSIS — E785 Hyperlipidemia, unspecified: Secondary | ICD-10-CM | POA: Diagnosis not present

## 2023-07-26 DIAGNOSIS — R739 Hyperglycemia, unspecified: Secondary | ICD-10-CM | POA: Diagnosis not present

## 2023-07-26 DIAGNOSIS — F3342 Major depressive disorder, recurrent, in full remission: Secondary | ICD-10-CM | POA: Diagnosis not present

## 2023-07-26 DIAGNOSIS — F320 Major depressive disorder, single episode, mild: Secondary | ICD-10-CM

## 2023-07-26 MED ORDER — REPATHA SURECLICK 140 MG/ML ~~LOC~~ SOAJ: 140.0000 mg | SUBCUTANEOUS | 2 refills | Status: DC

## 2023-07-26 MED ORDER — FLUOXETINE HCL 40 MG PO CAPS: 40.0000 mg | ORAL_CAPSULE | Freq: Every day | ORAL | 3 refills | Status: DC

## 2023-07-26 MED ORDER — AMLODIPINE BESYLATE 10 MG PO TABS: 10.0000 mg | ORAL_TABLET | Freq: Every day | ORAL | 3 refills | Status: DC

## 2023-07-26 MED ORDER — TIRZEPATIDE-WEIGHT MANAGEMENT 10 MG/0.5ML ~~LOC~~ SOAJ: 10.0000 mg | SUBCUTANEOUS | 3 refills | Status: DC

## 2023-07-26 MED ORDER — LOSARTAN POTASSIUM-HCTZ 100-12.5 MG PO TABS: 1.0000 | ORAL_TABLET | Freq: Every day | ORAL | 3 refills | Status: DC

## 2023-07-26 NOTE — Progress Notes (Signed)
 Subjective:    Patient ID: Sherry Davila, female    DOB: 06/27/1945, 78 y.o.   MRN: [id number]  Patient here for  Chief Complaint  Patient presents with   Medical Management of Chronic Issues    HPI Here for a scheduled follow up - follow up regarding hypercholesterolemia and hypertension. She is currently being followed by weight management ([facility]). Starting weight 224lbs per their report.  Currently on zepbound. Tolerating. Continues cpap. Doing well with her diet. Discussed continued diet and exercise. No chest pain or sob reported. Discussed recent lab results.    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   right upper arm above elbow, EDC   Varicose vein    Past Surgical History:  Procedure Laterality Date   COLONOSCOPY WITH PROPOFOL N/A 09/08/2016   Procedure: COLONOSCOPY WITH PROPOFOL;  Surgeon: Wyline Mood, MD;  Location: [facility] ENDOSCOPY;  Service: Endoscopy;  Laterality: N/A;   COLONOSCOPY WITH PROPOFOL N/A 01/07/2020   Procedure: COLONOSCOPY WITH PROPOFOL;  Surgeon: Wyline Mood, MD;  Location: [facility] ENDOSCOPY;  Service: Gastroenterology;  Laterality: N/A;   FRACTURE SURGERY     foot, screws placed   JOINT REPLACEMENT Right 2009   knee   Family History  Problem Relation Age of Onset   Cancer Mother    Heart failure Mother    Heart disease Mother    Breast cancer Mother 65   Heart attack Mother    Obesity Father    Depression Father    Early death Father    Hyperlipidemia Father    AAA (abdominal aortic aneurysm) Father    Hypertension Father    Heart disease Father    Hypertension Sister    Social History    Socioeconomic History   Marital status: Married    Spouse name: Not on file   Number of children: Not on file   Years of education: Not on file   Highest education level: 12th grade  Occupational History   Not on file  Tobacco Use   Smoking status: Former    Current packs/day: 0.00    Types: Cigarettes    Quit date: 01/19/1988    Years since quitting: 35.5   Smokeless tobacco: Never  Vaping Use   Vaping status: Never Used  Substance and Sexual Activity   Alcohol use: Yes    Alcohol/week: 3.0 standard drinks of alcohol    Types: 3 Shots of liquor per week    Comment: 3 drinks a week socially    Drug use: No   Sexual activity: Not on file  Other Topics Concern   Not on file  Social History Narrative   Married   Social Drivers of Health   Financial Resource Strain: Low Risk  (07/22/2023)   Overall Financial Resource Strain (CARDIA)    Difficulty of Paying Living Expenses: Not hard at all  Food Insecurity: No Food Insecurity (07/22/2023)   Hunger Vital Sign    Worried About Running Out of Food in the Last Year: Never true  Ran Out of Food in the Last Year: Never true  Transportation Needs: No Transportation Needs (07/22/2023)   PRAPARE - Administrator, Civil Service (Medical): No    Lack of Transportation (Non-Medical): No  Physical Activity: Insufficiently Active (07/22/2023)   Exercise Vital Sign    Days of Exercise per Week: 2 days    Minutes of Exercise per Session: 10 min  Stress: No Stress Concern Present (07/22/2023)   [employer] of Occupational Health - Occupational Stress Questionnaire    Feeling of Stress : Not at all  Social Connections: Socially Integrated (07/22/2023)   Social Connection and Isolation Panel [NHANES]    Frequency of Communication with Friends and Family: Twice a week    Frequency of Social Gatherings with Friends and Family: Once a week    Attends Religious Services: 1 to 4 times per year    Active Member of [employer] or  Organizations: Yes    Attends Engineer, structural: More than 4 times per year    Marital Status: Married     Review of Systems  Constitutional:  Negative for appetite change and unexpected weight change.  HENT:  Negative for congestion and sinus pressure.   Respiratory:  Negative for cough, chest tightness and shortness of breath.   Cardiovascular:  Negative for chest pain, palpitations and leg swelling.  Gastrointestinal:  Negative for abdominal pain, diarrhea, nausea and vomiting.  Genitourinary:  Negative for difficulty urinating and dysuria.  Musculoskeletal:  Negative for joint swelling and myalgias.  Skin:  Negative for color change and rash.  Neurological:  Negative for dizziness and headaches.  Psychiatric/Behavioral:  Negative for agitation and dysphoric mood.        Objective:     BP 128/82   Pulse 71   Temp 98 F (36.7 C)   Ht 5\' 6"  (1.676 m)   Wt 179 lb 6.4 oz (81.4 kg)   SpO2 99%   BMI 28.96 kg/m  Wt Readings from Last 3 Encounters:  07/27/23 179 lb (81.2 kg)  07/26/23 179 lb 6.4 oz (81.4 kg)  07/24/23 175 lb (79.4 kg)    Physical Exam Vitals reviewed.  Constitutional:      General: She is not in acute distress.    Appearance: Normal appearance.  HENT:     Head: Normocephalic and atraumatic.     Right Ear: External ear normal.     Left Ear: External ear normal.     Mouth/Throat:     Pharynx: No oropharyngeal exudate or posterior oropharyngeal erythema.  Eyes:     General: No scleral icterus.       Right eye: No discharge.        Left eye: No discharge.     Conjunctiva/sclera: Conjunctivae normal.  Neck:     Thyroid: No thyromegaly.  Cardiovascular:     Rate and Rhythm: Normal rate and regular rhythm.  Pulmonary:     Effort: No respiratory distress.     Breath sounds: Normal breath sounds. No wheezing.  Abdominal:     General: Bowel sounds are normal.     Palpations: Abdomen is soft.     Tenderness: There is no abdominal  tenderness.  Musculoskeletal:        General: No swelling or tenderness.     Cervical back: Neck supple. No tenderness.  Lymphadenopathy:     Cervical: No cervical adenopathy.  Skin:    Findings: No erythema or rash.  Neurological:     Mental  Status: She is alert.  Psychiatric:        Mood and Affect: Mood normal.        Behavior: Behavior normal.         Outpatient Encounter Medications as of 07/26/2023  Medication Sig   atenolol (TENORMIN) 25 MG tablet Take 1 tablet (25 mg total) by mouth daily.   Evolocumab (REPATHA SURECLICK) 140 MG/ML SOAJ Inject 140 mg into the skin every 14 (fourteen) days.   ezetimibe (ZETIA) 10 MG tablet Take 1 tablet (10 mg total) by mouth daily.   tirzepatide (ZEPBOUND) 10 MG/0.5ML Pen Inject 10 mg into the skin once a week.   amLODipine (NORVASC) 10 MG tablet Take 1 tablet (10 mg total) by mouth daily.   FLUoxetine (PROZAC) 40 MG capsule Take 1 capsule (40 mg total) by mouth daily.   losartan-hydrochlorothiazide (HYZAAR) 100-12.5 MG tablet Take 1 tablet by mouth daily.   [DISCONTINUED] amLODipine (NORVASC) 10 MG tablet TAKE 1 TABLET BY MOUTH DAILY   [DISCONTINUED] FLUoxetine (PROZAC) 40 MG capsule TAKE 1 CAPSULE BY MOUTH DAILY   [DISCONTINUED] losartan-hydrochlorothiazide (HYZAAR) 100-12.5 MG tablet TAKE 1 TABLET BY MOUTH DAILY   [DISCONTINUED] ZEPBOUND 7.5 MG/0.5ML Pen INJECT 1 DOSE (7.5MG ) SUBCUTANEOUSLY ONCE A WEEK   No facility-administered encounter medications on file as of 07/26/2023.     Lab Results  Component Value Date   WBC 6.0 01/18/2023   HGB 14.2 01/18/2023   HCT 43.9 01/18/2023   PLT 260 01/18/2023   GLUCOSE 85 07/24/2023   CHOL 224 (H) 07/24/2023   TRIG 72.0 07/24/2023   HDL 94.60 07/24/2023   LDLCALC 115 (H) 07/24/2023   ALT 14 07/24/2023   AST 16 07/24/2023   NA 139 07/24/2023   K 4.3 07/24/2023   CL 103 07/24/2023   CREATININE 0.52 07/24/2023   BUN 18 07/24/2023   CO2 28 07/24/2023   TSH 2.30 03/23/2023   HGBA1C 4.9  07/24/2023    DG Chest 2 View Result Date: 01/18/2023 CLINICAL DATA:  Shortness of breath EXAM: CHEST - 2 VIEW COMPARISON:  X-ray 08/15/2022 FINDINGS: The heart size and mediastinal contours are within normal limits. Hyperinflation. No consolidation, pneumothorax or effusion. No edema. Tortuous and ectatic aorta. The visualized skeletal structures are unremarkable. Degenerative changes of the spine. IMPRESSION: Hyperinflation.  No acute cardiopulmonary disease Electronically Signed   By: Karen Kays M.D.   On: 01/18/2023 10:22       Assessment & Plan:  Statin myopathy Assessment & Plan: Has intolerance to statins.  Has been on Zetia.  Discussed recent labs.  LDL 115.  Discussed other treatment options.  Agreeable to start Repatha.  If we can get repatha approved, we will stop Zetia.   Essential hypertension Assessment & Plan: Continue Hyzaar, amlodipine and atenolol.  Follow pressures.  Follow metabolic panel.  Orders: -     Basic metabolic panel; Future  Hyperglycemia Assessment & Plan: Continue diet and exercise.  Follow A1c.  Lab Results  Component Value Date   HGBA1C 4.9 07/24/2023     Orders: -     Hemoglobin A1c; Future  Hyperlipidemia, unspecified hyperlipidemia type Assessment & Plan: The 10-year ASCVD risk score (Arnett DK, et al., 2019) is: 24.1%   Values used to calculate the score:     Age: 55 years     Sex: Female     Is Non-Hispanic African American: No     Diabetic: No     Tobacco smoker: No     Systolic Blood  Pressure: 128 mmHg     Is BP treated: Yes     HDL Cholesterol: 94.6 mg/dL     Total Cholesterol: 224 mg/dL  Discussed current labs.  Had intolerance to statins.  On Zetia.  Labs as outlined.  Discussed treatment with Repatha.  She is agreeable.  Orders: -     Hepatic function panel; Future -     Lipid panel; Future  Recurrent major depressive disorder, in full remission (HCC)  OSA on CPAP Assessment & Plan: Has had good adherence with CPAP  therapy.  Has lost weight.   Major depressive disorder, single episode, mild (HCC) Assessment & Plan: Remains on Prozac.  Appears to be doing well.  Follow.   Depression, major, single episode, mild (HCC) Assessment & Plan: Continue Prozac.  Overall appears to be doing well.  Follow.   Other orders -     amLODIPine Besylate; Take 1 tablet (10 mg total) by mouth daily.  Dispense: 90 tablet; Refill: 3 -     FLUoxetine HCl; Take 1 capsule (40 mg total) by mouth daily.  Dispense: 90 capsule; Refill: 3 -     Losartan Potassium-HCTZ; Take 1 tablet by mouth daily.  Dispense: 90 tablet; Refill: 3 -     Tirzepatide-Weight Management; Inject 10 mg into the skin once a week.  Dispense: 2 mL; Refill: 3 -     Repatha SureClick; Inject 140 mg into the skin every 14 (fourteen) days.  Dispense: 2 mL; Refill: 2     Dale Thompsons, MD

## 2023-07-26 NOTE — Assessment & Plan Note (Addendum)
 The 10-year ASCVD risk score (Arnett DK, et al., 2019) is: 24.1%   Values used to calculate the score:     Age: 78 years     Sex: Female     Is Non-Hispanic African American: No     Diabetic: No     Tobacco smoker: No     Systolic Blood Pressure: 128 mmHg     Is BP treated: Yes     HDL Cholesterol: 94.6 mg/dL     Total Cholesterol: 224 mg/dL  Discussed current labs.  Had intolerance to statins.  On Zetia.  Labs as outlined.  Discussed treatment with Repatha.  She is agreeable.

## 2023-07-27 ENCOUNTER — Other Ambulatory Visit (HOSPITAL_COMMUNITY): Payer: Self-pay

## 2023-07-27 ENCOUNTER — Encounter: Payer: Self-pay | Admitting: Cardiology

## 2023-07-27 ENCOUNTER — Telehealth: Payer: Self-pay | Admitting: Pharmacy Technician

## 2023-07-27 ENCOUNTER — Ambulatory Visit: Payer: Medicare Other | Attending: Cardiology | Admitting: Cardiology

## 2023-07-27 VITALS — BP 112/72 | HR 72 | Ht 66.0 in | Wt 179.0 lb

## 2023-07-27 DIAGNOSIS — E7800 Pure hypercholesterolemia, unspecified: Secondary | ICD-10-CM | POA: Diagnosis not present

## 2023-07-27 DIAGNOSIS — I10 Essential (primary) hypertension: Secondary | ICD-10-CM | POA: Diagnosis not present

## 2023-07-27 MED ORDER — NEXLETOL 180 MG PO TABS: 180.0000 mg | ORAL_TABLET | Freq: Every day | ORAL | 3 refills | Status: DC

## 2023-07-27 NOTE — Progress Notes (Signed)
 Cardiology Office Note:    Date:  07/27/2023   ID:  Sherry Davila, DOB [birth year], MRN [id number]  PCP:  Dale [place], MD  [facility] Cardiologist:  Debbe Odea, MD  [facility] Electrophysiologist:  None   Referring MD: Dale [place], MD   Chief Complaint  Patient presents with   Follow-up    12 month follow up visit. Patient is doing well on today. Meds reviewed.     History of Present Illness:    Sherry Davila is a 79 y.o. female with a hx of anxiety, hypertension, hyperlipidemia, obesity, sleep apnea, former smoker x20 years who presents for follow-up.  Doing okay, denies chest pain or shortness of breath.  Tolerating BP meds as prescribed.  Repeat lipid panel was elevated, Repatha started by PCP but not approved by [employer].  Patient not tolerant to statins.  Zepbound also started to help with weight loss.  Has lost over 55 pounds over the past year.  She feels well, has no concerns at this time.  Prior notes Echo 12/2019, normal systolic function EF 60 to 65%, grade 2 diastolic dysfunction. Lexiscan Myoview 10/2019, no evidence for ischemia.   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   right upper arm above elbow, EDC   Varicose vein     Past Surgical History:  Procedure Laterality Date   COLONOSCOPY WITH PROPOFOL N/A 09/08/2016   Procedure: COLONOSCOPY WITH PROPOFOL;  Surgeon: Wyline Mood, MD;  Location: [facility] ENDOSCOPY;  Service: Endoscopy;  Laterality: N/A;   COLONOSCOPY WITH PROPOFOL N/A 01/07/2020   Procedure: COLONOSCOPY WITH PROPOFOL;  Surgeon: Wyline Mood, MD;  Location: [facility]  ENDOSCOPY;  Service: Gastroenterology;  Laterality: N/A;   FRACTURE SURGERY     foot, screws placed   JOINT REPLACEMENT Right 2009   knee    Current Medications: Current Meds  Medication Sig   amLODipine (NORVASC) 10 MG tablet Take 1 tablet (10 mg total) by mouth daily.   atenolol (TENORMIN) 25 MG tablet Take 1 tablet (25 mg total) by mouth daily.   Bempedoic Acid (NEXLETOL) 180 MG TABS Take 1 tablet (180 mg total) by mouth daily.   Evolocumab (REPATHA SURECLICK) 140 MG/ML SOAJ Inject 140 mg into the skin every 14 (fourteen) days.   ezetimibe (ZETIA) 10 MG tablet Take 1 tablet (10 mg total) by mouth daily.   FLUoxetine (PROZAC) 40 MG capsule Take 1 capsule (40 mg total) by mouth daily.   losartan-hydrochlorothiazide (HYZAAR) 100-12.5 MG tablet Take 1 tablet by mouth daily.   tirzepatide (ZEPBOUND) 10 MG/0.5ML Pen Inject 10 mg into the skin once a week.     Allergies:   Statins   Social History   Socioeconomic History   Marital status: Married    Spouse name: Not on file   Number of children: Not on file   Years of education: Not on file   Highest education level: 12th grade  Occupational History   Not on file  Tobacco Use   Smoking status: Former    Current  packs/day: 0.00    Types: Cigarettes    Quit date: 01/19/1988    Years since quitting: 35.5   Smokeless tobacco: Never  Vaping Use   Vaping status: Never Used  Substance and Sexual Activity   Alcohol use: Yes    Alcohol/week: 3.0 standard drinks of alcohol    Types: 3 Shots of liquor per week    Comment: 3 drinks a week socially    Drug use: No   Sexual activity: Not on file  Other Topics Concern   Not on file  Social History Narrative   Married   Social Drivers of Health   Financial Resource Strain: Low Risk  (07/22/2023)   Overall Financial Resource Strain (CARDIA)    Difficulty of Paying Living Expenses: Not hard at all  Food Insecurity: No Food Insecurity (07/22/2023)   Hunger Vital Sign    Worried About  Running Out of Food in the Last Year: Never true    Ran Out of Food in the Last Year: Never true  Transportation Needs: No Transportation Needs (07/22/2023)   PRAPARE - Administrator, Civil Service (Medical): No    Lack of Transportation (Non-Medical): No  Physical Activity: Insufficiently Active (07/22/2023)   Exercise Vital Sign    Days of Exercise per Week: 2 days    Minutes of Exercise per Session: 10 min  Stress: No Stress Concern Present (07/22/2023)   [employer] of Occupational Health - Occupational Stress Questionnaire    Feeling of Stress : Not at all  Social Connections: Socially Integrated (07/22/2023)   Social Connection and Isolation Panel [NHANES]    Frequency of Communication with Friends and Family: Twice a week    Frequency of Social Gatherings with Friends and Family: Once a week    Attends Religious Services: 1 to 4 times per year    Active Member of [employer] or Organizations: Yes    Attends Engineer, structural: More than 4 times per year    Marital Status: Married     Family History: The patient's family history includes AAA (abdominal aortic aneurysm) in her father; Breast cancer (age of onset: 12) in her mother; Cancer in her mother; Depression in her father; Early death in her father; Heart attack in her mother; Heart disease in her father and mother; Heart failure in her mother; Hyperlipidemia in her father; Hypertension in her father and sister; Obesity in her father.  ROS:   Please see the history of present illness.     All other systems reviewed and are negative.  EKGs/Labs/Other Studies Reviewed:    The following studies were reviewed today:  EKG Interpretation Date/Time:  Thursday July 27 2023 11:55:50 EST Ventricular Rate:  72 PR Interval:  188 QRS Duration:  86 QT Interval:  410 QTC Calculation: 448 R Axis:   -21  Text Interpretation: Normal sinus rhythm Minimal voltage criteria for LVH, may be normal variant ( R in aVL )  Confirmed by Debbe Odea ([id number]) on 07/27/2023 11:57:53 AM    Recent Labs: 01/18/2023: Hemoglobin 14.2; Platelets 260 03/23/2023: TSH 2.30 07/24/2023: ALT 14; BUN 18; Creatinine, Ser 0.52; Potassium 4.3; Sodium 139  Recent Lipid Panel    Component Value Date/Time   CHOL 224 (H) 07/24/2023 0918   CHOL 177 04/09/2018 1124   TRIG 72.0 07/24/2023 0918   TRIG 83 04/09/2018 1124   HDL 94.60 07/24/2023 0918   HDL 109 09/28/2017 1557   CHOLHDL 2 07/24/2023 0918   VLDL 14.4 07/24/2023  9562   VLDL 17 04/09/2018 1124   LDLCALC 115 (H) 07/24/2023 0918   LDLCALC 110 (H) 09/28/2017 1557    Physical Exam:    VS:  BP 112/72   Pulse 72   Ht 5\' 6"  (1.676 m)   Wt 179 lb (81.2 kg)   SpO2 98%   BMI 28.89 kg/m     Wt Readings from Last 3 Encounters:  07/27/23 179 lb (81.2 kg)  07/26/23 179 lb 6.4 oz (81.4 kg)  07/24/23 175 lb (79.4 kg)     GEN:  Well nourished, well developed in no acute distress HEENT: Normal NECK: No JVD; No carotid bruits CARDIAC: RRR, no murmurs, rubs, gallops RESPIRATORY:  Clear to auscultation without rales, wheezing or rhonchi  ABDOMEN: Soft, non-tender, non-distended MUSCULOSKELETAL:  No edema; No deformity  SKIN: Warm and dry NEUROLOGIC:  Alert and oriented x 3 PSYCHIATRIC:  Normal affect   ASSESSMENT:    1. Primary hypertension   2. Pure hypercholesterolemia    PLAN:    In order of problems listed above:  hypertension, blood pressure controlled.  Continue losartan-HCTZ 100-12.5 mg daily, Norvasc 10 mg daily,.  Bradycardia, atenolol 25 mg daily. Hyperlipidemia, not tolerant to statins, start bempedoic acid.  Continue Zetia.  Repatha not approved by [employer]. Overweight, graduated on weight loss journey.  Has lost 55 pounds over the past year.  Continue low-calorie diet, weight loss, Zepbound.  Follow-up in 12 months.  This note was generated in part or whole with voice recognition software. Voice recognition is usually quite accurate but  there are transcription errors that can and very often do occur. I apologize for any typographical errors that were not detected and corrected.  Medication Adjustments/Labs and Tests Ordered: Current medicines are reviewed at length with the patient today.  Concerns regarding medicines are outlined above.  Orders Placed This Encounter  Procedures   EKG 12-Lead   Meds ordered this encounter  Medications   Bempedoic Acid (NEXLETOL) 180 MG TABS    Sig: Take 1 tablet (180 mg total) by mouth daily.    Dispense:  90 tablet    Refill:  3    Patient Instructions  Medication Instructions:  Your physician recommends the following medication changes.  START TAKING: Nexletol 180 mg by mouth daily  *If you need a refill on your cardiac medications before your next appointment, please call your pharmacy*   Lab Work: No labs ordered today    Testing/Procedures: No test ordered today    Follow-Up: At [facility], you and your health needs are our priority.  As part of our continuing mission to provide you with exceptional heart care, we have created designated Provider Care Teams.  These Care Teams include your primary Cardiologist (physician) and Advanced Practice Providers (APPs -  Physician Assistants and Nurse Practitioners) who all work together to provide you with the care you need, when you need it.  We recommend signing up for the patient portal called "MyChart".  Sign up information is provided on this After Visit Summary.  MyChart is used to connect with patients for Virtual Visits (Telemedicine).  Patients are able to view lab/test results, encounter notes, upcoming appointments, etc.  Non-urgent messages can be sent to your provider as well.   To learn more about what you can do with MyChart, go to [email or web address].    Your next appointment:   1 year(s)  Provider:   You may see Debbe Odea, MD or one of the following Advanced  Practice Providers on your  designated Care Team:   Nicolasa Ducking, NP Eula Listen, PA-C Cadence Fransico Michael, PA-C Charlsie Quest, NP Carlos Levering, NP          Signed, Debbe Odea, MD  07/27/2023 12:20 PM    [facility]

## 2023-07-27 NOTE — Patient Instructions (Signed)
 Medication Instructions:  Your physician recommends the following medication changes.  START TAKING: Nexletol 180 mg by mouth daily  *If you need a refill on your cardiac medications before your next appointment, please call your pharmacy*   Lab Work: No labs ordered today    Testing/Procedures: No test ordered today    Follow-Up: At [facility], you and your health needs are our priority.  As part of our continuing mission to provide you with exceptional heart care, we have created designated Provider Care Teams.  These Care Teams include your primary Cardiologist (physician) and Advanced Practice Providers (APPs -  Physician Assistants and Nurse Practitioners) who all work together to provide you with the care you need, when you need it.  We recommend signing up for the patient portal called "MyChart".  Sign up information is provided on this After Visit Summary.  MyChart is used to connect with patients for Virtual Visits (Telemedicine).  Patients are able to view lab/test results, encounter notes, upcoming appointments, etc.  Non-urgent messages can be sent to your provider as well.   To learn more about what you can do with MyChart, go to [email or web address].    Your next appointment:   1 year(s)  Provider:   You may see Debbe Odea, MD or one of the following Advanced Practice Providers on your designated Care Team:   Nicolasa Ducking, NP Eula Listen, PA-C Cadence Fransico Michael, PA-C Charlsie Quest, NP Carlos Levering, NP

## 2023-07-27 NOTE — Telephone Encounter (Signed)
 Pharmacy Patient Advocate Encounter   Received notification from Fax that prior authorization for nexletol is required/requested.   Insurance verification completed.   The patient is insured through [facility] .   Per test claim: PA required; PA submitted to above mentioned insurance via CoverMyMeds Key/confirmation #/EOC [id number] Status is pending

## 2023-07-28 ENCOUNTER — Other Ambulatory Visit (HOSPITAL_COMMUNITY): Payer: Self-pay

## 2023-07-28 NOTE — Telephone Encounter (Signed)
 Called patient, advised of message below.   Patient states her PCP is trying to get her on Repatha, she is going to wait and see if she gets approved for this, and then will let us know about the Nexletol. Advised I would notify MD.

## 2023-07-28 NOTE — Telephone Encounter (Signed)
 Pharmacy Patient Advocate Encounter  Received notification from [facility] that Prior Authorization for nexletol has been APPROVED from 07/13/23 to until further notice. Ran test claim, Copay is $146.46. This test claim was processed through [facility]- copay amounts may vary at other pharmacies due to pharmacy/plan contracts, or as the patient moves through the different stages of their insurance plan.   PA #/Case ID/Reference #:  [id number]

## 2023-07-30 ENCOUNTER — Encounter: Payer: Self-pay | Admitting: Internal Medicine

## 2023-07-30 NOTE — Assessment & Plan Note (Signed)
 Remains on Prozac.  Appears to be doing well.  Follow.

## 2023-07-30 NOTE — Assessment & Plan Note (Signed)
 Has intolerance to statins.  Has been on Zetia.  Discussed recent labs.  LDL 115.  Discussed other treatment options.  Agreeable to start Repatha.  If we can get repatha approved, we will stop Zetia.

## 2023-07-30 NOTE — Assessment & Plan Note (Signed)
 Continue Hyzaar, amlodipine and atenolol.  Follow pressures.  Follow metabolic panel.

## 2023-07-30 NOTE — Assessment & Plan Note (Signed)
 Continue Prozac.  Overall appears to be doing well.  Follow.

## 2023-07-30 NOTE — Assessment & Plan Note (Signed)
 Continue diet and exercise.  Follow A1c.  Lab Results  Component Value Date   HGBA1C 4.9 07/24/2023

## 2023-07-30 NOTE — Assessment & Plan Note (Signed)
 Has had good adherence with CPAP therapy.  Has lost weight.

## 2023-08-16 DIAGNOSIS — H353221 Exudative age-related macular degeneration, left eye, with active choroidal neovascularization: Secondary | ICD-10-CM | POA: Diagnosis not present

## 2023-08-23 ENCOUNTER — Encounter: Payer: Self-pay | Admitting: Dermatology

## 2023-08-23 ENCOUNTER — Ambulatory Visit (INDEPENDENT_AMBULATORY_CARE_PROVIDER_SITE_OTHER): Payer: Medicare Other | Admitting: Dermatology

## 2023-08-23 ENCOUNTER — Ambulatory Visit: Payer: Medicare Other | Admitting: Dermatology

## 2023-08-23 DIAGNOSIS — Z8589 Personal history of malignant neoplasm of other organs and systems: Secondary | ICD-10-CM

## 2023-08-23 DIAGNOSIS — Z85828 Personal history of other malignant neoplasm of skin: Secondary | ICD-10-CM | POA: Diagnosis not present

## 2023-08-23 DIAGNOSIS — D229 Melanocytic nevi, unspecified: Secondary | ICD-10-CM

## 2023-08-23 DIAGNOSIS — I781 Nevus, non-neoplastic: Secondary | ICD-10-CM | POA: Diagnosis not present

## 2023-08-23 DIAGNOSIS — D1801 Hemangioma of skin and subcutaneous tissue: Secondary | ICD-10-CM | POA: Diagnosis not present

## 2023-08-23 DIAGNOSIS — L814 Other melanin hyperpigmentation: Secondary | ICD-10-CM | POA: Diagnosis not present

## 2023-08-23 DIAGNOSIS — D692 Other nonthrombocytopenic purpura: Secondary | ICD-10-CM | POA: Diagnosis not present

## 2023-08-23 DIAGNOSIS — Z86018 Personal history of other benign neoplasm: Secondary | ICD-10-CM | POA: Diagnosis not present

## 2023-08-23 DIAGNOSIS — I8393 Asymptomatic varicose veins of bilateral lower extremities: Secondary | ICD-10-CM

## 2023-08-23 DIAGNOSIS — L578 Other skin changes due to chronic exposure to nonionizing radiation: Secondary | ICD-10-CM

## 2023-08-23 DIAGNOSIS — L820 Inflamed seborrheic keratosis: Secondary | ICD-10-CM

## 2023-08-23 DIAGNOSIS — Z1283 Encounter for screening for malignant neoplasm of skin: Secondary | ICD-10-CM

## 2023-08-23 DIAGNOSIS — L821 Other seborrheic keratosis: Secondary | ICD-10-CM

## 2023-08-23 DIAGNOSIS — W908XXA Exposure to other nonionizing radiation, initial encounter: Secondary | ICD-10-CM

## 2023-08-23 NOTE — Progress Notes (Signed)
 Follow-Up Visit   Subjective  Sherry Davila is a 78 y.o. female who presents for the following: Skin Cancer Screening and Full Body Skin Exam hx of BCC, SCC, Dysplastic nevus, Aks, recheck recent SCC that was bx and EDC on 06/27/2023, check spot L jaw  The patient presents for Total-Body Skin Exam (TBSE) for skin cancer screening and mole check. The patient has spots, moles and lesions to be evaluated, some may be new or changing and the patient may have concern these could be cancer.  The following portions of the chart were reviewed this encounter and updated as appropriate: medications, allergies, medical history  Review of Systems:  No other skin or systemic complaints except as noted in HPI or Assessment and Plan.  Objective  Well appearing patient in no apparent distress; mood and affect are within normal limits.  A full examination was performed including scalp, head, eyes, ears, nose, lips, neck, chest, axillae, abdomen, back, buttocks, bilateral upper extremities, bilateral lower extremities, hands, feet, fingers, toes, fingernails, and toenails. All findings within normal limits unless otherwise noted below.   Relevant physical exam findings are noted in the Assessment and Plan.  L mandible x 1, R cheek x 1 (2) Stuck on waxy paps with erythema   Assessment & Plan   SKIN CANCER SCREENING PERFORMED TODAY.  ACTINIC DAMAGE - Chronic condition, secondary to cumulative UV/sun exposure - diffuse scaly erythematous macules with underlying dyspigmentation - Recommend daily broad spectrum sunscreen SPF 30+ to sun-exposed areas, reapply every 2 hours as needed.  - Staying in the shade or wearing long sleeves, sun glasses (UVA+UVB protection) and wide brim hats (4-inch brim around the entire circumference of the hat) are also recommended for sun protection.  - Call for new or changing lesions.  LENTIGINES, SEBORRHEIC KERATOSES, HEMANGIOMAS - Benign normal skin lesions -  Benign-appearing - Call for any changes  MELANOCYTIC NEVI - Tan-brown and/or pink-flesh-colored symmetric macules and papules - Benign appearing on exam today - Observation - Call clinic for new or changing moles - Recommend daily use of broad spectrum spf 30+ sunscreen to sun-exposed areas.   HISTORY OF BASAL CELL CARCINOMA OF THE SKIN - No evidence of recurrence today - Recommend regular full body skin exams - Recommend daily broad spectrum sunscreen SPF 30+ to sun-exposed areas, reapply every 2 hours as needed.  - Call if any new or changing lesions are noted between office visits  - R prox nasal ala  HISTORY OF SQUAMOUS CELL CARCINOMA OF THE SKIN - No evidence of recurrence today - No lymphadenopathy - Recommend regular full body skin exams - Recommend daily broad spectrum sunscreen SPF 30+ to sun-exposed areas, reapply every 2 hours as needed.  - Call if any new or changing lesions are noted between office visits - R upper arm above elbow  HISTORY OF DYSPLASTIC NEVUS No evidence of recurrence today Recommend regular full body skin exams Recommend daily broad spectrum sunscreen SPF 30+ to sun-exposed areas, reapply every 2 hours as needed.  Call if any new or changing lesions are noted between office visits  - R abdomen   HISTORY OF PRECANCEROUS ACTINIC KERATOSIS - site(s) of PreCancerous Actinic Keratosis clear today. - these may recur and new lesions may form requiring treatment to prevent transformation into skin cancer - observe for new or changing spots and contact [facility] for appointment if occur - photoprotection with sun protective clothing; sunglasses and broad spectrum sunscreen with SPF of at least 30 +  and frequent self skin exams recommended - yearly exams by a dermatologist recommended for persons with history of PreCancerous Actinic Keratoses   Purpura - Chronic; persistent and recurrent.  Treatable, but not curable. - Violaceous macules and  patches - Benign - Related to trauma, age, sun damage and/or use of blood thinners, chronic use of topical and/or oral steroids - Observe - Can use OTC arnica containing moisturizer such as Dermend Bruise Formula if desired - Call for worsening or other concerns  Varicose Veins/Spider Veins - Dilated blue, purple or red veins at the lower extremities - Reassured - Smaller vessels can be treated by sclerotherapy (a procedure to inject a medicine into the veins to make them disappear) if desired, but the treatment is not covered by insurance. Larger vessels may be covered if symptomatic and we would refer to vascular surgeon if treatment desired.  INFLAMED SEBORRHEIC KERATOSIS (2) L mandible x 1, R cheek x 1 (2) Symptomatic, irritating, patient would like treated.  Consider bx if R cheek ISK not resolved with LN2 Destruction of lesion - L mandible x 1, R cheek x 1 (2) Complexity: simple   Destruction method: cryotherapy   Informed consent: discussed and consent obtained   Timeout:  patient name, date of birth, surgical site, and procedure verified Lesion destroyed using liquid nitrogen: Yes   Region frozen until ice ball extended beyond lesion: Yes   Outcome: patient tolerated procedure well with no complications   Post-procedure details: wound care instructions given   Return in about 1 year (around 08/22/2024) for TBSE, Hx of BCC, Hx of SCC, Hx of Dysplastic nevi, Hx of AKs.  I, Ardis Rowan, RMA, am acting as scribe for Armida Sans, MD .   Documentation: I have reviewed the above documentation for accuracy and completeness, and I agree with the above.  Armida Sans, MD

## 2023-08-23 NOTE — Patient Instructions (Addendum)

## 2023-08-24 ENCOUNTER — Ambulatory Visit (INDEPENDENT_AMBULATORY_CARE_PROVIDER_SITE_OTHER): Admitting: Internal Medicine

## 2023-08-24 ENCOUNTER — Encounter (INDEPENDENT_AMBULATORY_CARE_PROVIDER_SITE_OTHER): Payer: Self-pay | Admitting: Internal Medicine

## 2023-08-24 VITALS — BP 128/74 | HR 71 | Temp 98.1°F | Ht 66.0 in | Wt 172.0 lb

## 2023-08-24 DIAGNOSIS — G4733 Obstructive sleep apnea (adult) (pediatric): Secondary | ICD-10-CM

## 2023-08-24 DIAGNOSIS — Z6827 Body mass index (BMI) 27.0-27.9, adult: Secondary | ICD-10-CM

## 2023-08-24 DIAGNOSIS — I10 Essential (primary) hypertension: Secondary | ICD-10-CM | POA: Diagnosis not present

## 2023-08-24 DIAGNOSIS — R638 Other symptoms and signs concerning food and fluid intake: Secondary | ICD-10-CM

## 2023-08-24 NOTE — Assessment & Plan Note (Signed)
 Patient has lost 52 pounds or 23% of total body weight since starting our program.  She is following a 1200-calorie Mediterranean plan has been increasing her protein intake and increasing volume of physical activity which has resulted in additional weight loss.  She will continue with current weight management strategy inclusive of GLP-1 therapy

## 2023-08-24 NOTE — Progress Notes (Signed)
 Office: [phone]  /  Fax: [phone]  Weight Summary And Biometrics  Vitals Temp: 98.1 F (36.7 C) BP: 128/74 Pulse Rate: 71 SpO2: 98 %   Anthropometric Measurements Height: 5\' 6"  (1.676 m) Weight: 172 lb (78 kg) BMI (Calculated): 27.77 Weight at Last Visit: 176 lb Weight Lost Since Last Visit: 4 lb Weight Gained Since Last Visit: 0 Starting Weight: 224 lb Total Weight Loss (lbs): 52 lb (23.6 kg) Peak Weight: 233 lb   Body Composition  Body Fat %: 39.9 % Fat Mass (lbs): 68.6 lbs Muscle Mass (lbs): 98.2 lbs Total Body Water (lbs): 65.8 lbs Visceral Fat Rating : 11    No data recorded Today's Visit #: 16  Starting Date: 12/23/21   Subjective   Chief Complaint: Obesity  Interval History Discussed the use of AI scribe software for clinical note transcription with the patient, who gave verbal consent to proceed.  History of Present Illness Sherry Davila is a 78 year old female who presents for medical weight management.  She has lost four pounds since her last visit and adheres to a 1200 calorie meal plan, consuming approximately 80 grams of protein about 50% of the time. She is eating more whole foods, maintaining adequate hydration, and consuming two meals a day. She aims to increase her protein intake to between 90 to 120 grams per day, targeting at least 30 grams per meal. Her appetite is adequately controlled with no side effects from her current regimen.  She has incorporated Pilates into her routine, engaging in sessions twice a week for about 50 minutes each. She started this regimen three weeks ago and finds it challenging but beneficial, noting soreness as a positive sign of physical activity. She has a history of traveling and eating out, which previously impacted her dietary habits, but she is now more focused on maintaining her exercise and dietary regimen.  She has a history of using CPAP for sleep apnea but has recently stopped using it.  Her husband has noticed an improvement in her breathing and a reduction in snoring, although she still snores a little without gasping for air.  Her current medications include losartan hydrochlorothiazide and Zepbound, which she takes at 10 mg once a week. She reports no side effects from the current dose of Zepbound.  Her body composition has improved, with a BMI of 27, a decrease in body fat percentage from 42% to 39%, and a reduction in visceral fat from a rating of 14 to 11. She aims to reach a weight of 150 pounds, considering it a suitable target for her age.    Challenges affecting patient progress: none.    Pharmacotherapy for weight management: She is currently taking Zepbound with adequate clinical response  and without side effects..   Assessment and Plan   Treatment Plan For Obesity:  Recommended Dietary Goals  Sherry Davila is currently in the action stage of change. As such, her goal is to continue weight management plan. She has agreed to: follow the Category 2 plan - 1200 kcal per day  Behavioral Health and Counseling  We discussed the following behavioral modification strategies today: continue to work on maintaining a reduced calorie state, getting the recommended amount of protein, incorporating whole foods, making healthy choices, staying well hydrated and practicing mindfulness when eating..  Additional education and resources provided today: None  Recommended Physical Activity Goals  Sherry Davila has been advised to work up to 150 minutes of moderate intensity aerobic activity a week and strengthening  exercises 2-3 times per week for cardiovascular health, weight loss maintenance and preservation of muscle mass.   She has agreed to :  continue to gradually increase the amount and intensity of exercise routine  Pharmacotherapy  We discussed various medication options to help Sherry Davila with her weight loss efforts and we both agreed to : adequate clinical response to current  dose, continue current regimen and do not recommend further increases in GLP-1 due to loss of muscle  Associated Conditions Impacted by Obesity Treatment  OSA on CPAP Assessment & Plan: Patient reports good adherence with CPAP therapy.  She has lost 20% of total body weight and may benefit from a repeat sleep study to see if she still needs CPAP.  Continue treatment with Zepbound for weight management    Essential hypertension Assessment & Plan:  Blood pressure is well-controlled.  She is currently on Hyzaar, amlodipine and atenolol.  She will continue current regimen.  Monitor for orthostasis as she continues to lose weight   Abnormal food appetite Assessment & Plan: Improved on Zepbound  She has increased orexigenic signaling, impaired satiety and inhibitory control. This is secondary to an abnormal energy regulation system and pathological neurohormonal pathways characteristic of excess adiposity.  In addition to nutritional and behavioral strategies she benefits from pharmacotherapy.  Continue Zepbound at current dose   Morbid obesity (HCC) with starting BMI 37 Assessment & Plan: Patient has lost 52 pounds or 23% of total body weight since starting our program.  She is following a 1200-calorie Mediterranean plan has been increasing her protein intake and increasing volume of physical activity which has resulted in additional weight loss.  She will continue with current weight management strategy inclusive of GLP-1 therapy           Objective   Physical Exam:  Blood pressure 128/74, pulse 71, temperature 98.1 F (36.7 C), height 5\' 6"  (1.676 m), weight 172 lb (78 kg), SpO2 98%. Body mass index is 27.76 kg/m.  General: She is overweight, cooperative, alert, well developed, and in no acute distress. PSYCH: Has normal mood, affect and thought process.   HEENT: EOMI, sclerae are anicteric. Lungs: Normal breathing effort, no conversational dyspnea. Extremities: No edema.   Neurologic: No gross sensory or motor deficits. No tremors or fasciculations noted.    Diagnostic Data Reviewed:  BMET    Component Value Date/Time   NA 139 07/24/2023 0918   NA 140 04/09/2018 1126   K 4.3 07/24/2023 0918   CL 103 07/24/2023 0918   CO2 28 07/24/2023 0918   GLUCOSE 85 07/24/2023 0918   BUN 18 07/24/2023 0918   BUN 22 04/09/2018 1126   CREATININE 0.52 07/24/2023 0918   CALCIUM 9.3 07/24/2023 0918   GFRNONAA >60 01/18/2023 1000   GFRAA 108 04/09/2018 1126   Lab Results  Component Value Date   HGBA1C 4.9 07/24/2023   HGBA1C 5.6 10/07/2020   No results found for: "INSULIN" Lab Results  Component Value Date   TSH 2.30 03/23/2023   CBC    Component Value Date/Time   WBC 6.0 01/18/2023 1000   RBC 4.82 01/18/2023 1000   HGB 14.2 01/18/2023 1000   HGB 14.1 09/28/2017 1557   HCT 43.9 01/18/2023 1000   HCT 41.8 09/28/2017 1557   PLT 260 01/18/2023 1000   PLT 234 09/28/2017 1557   MCV 91.1 01/18/2023 1000   MCV 91 09/28/2017 1557   MCH 29.5 01/18/2023 1000   MCHC 32.3 01/18/2023 1000   RDW 14.3 01/18/2023  1000   RDW 14.6 09/28/2017 1557   Iron Studies No results found for: "IRON", "TIBC", "FERRITIN", "IRONPCTSAT" Lipid Panel     Component Value Date/Time   CHOL 224 (H) 07/24/2023 0918   CHOL 177 04/09/2018 1124   TRIG 72.0 07/24/2023 0918   TRIG 83 04/09/2018 1124   HDL 94.60 07/24/2023 0918   HDL 109 09/28/2017 1557   CHOLHDL 2 07/24/2023 0918   VLDL 14.4 07/24/2023 0918   VLDL 17 04/09/2018 1124   LDLCALC 115 (H) 07/24/2023 0918   LDLCALC 110 (H) 09/28/2017 1557   Hepatic Function Panel     Component Value Date/Time   PROT 6.7 07/24/2023 0918   PROT 7.0 09/28/2017 1557   ALBUMIN 4.1 07/24/2023 0918   ALBUMIN 4.7 09/28/2017 1557   AST 16 07/24/2023 0918   AST 28 04/09/2018 1124   ALT 14 07/24/2023 0918   ALT 29 04/09/2018 1124   ALKPHOS 78 07/24/2023 0918   BILITOT 0.6 07/24/2023 0918   BILITOT 0.4 09/28/2017 1557   BILIDIR 0.1  07/24/2023 0918      Component Value Date/Time   TSH 2.30 03/23/2023 0926   Nutritional Lab Results  Component Value Date   VD25OH 24.2 (L) 12/23/2021    Medications: Outpatient Encounter Medications as of 08/24/2023  Medication Sig   amLODipine (NORVASC) 10 MG tablet Take 1 tablet (10 mg total) by mouth daily.   atenolol (TENORMIN) 25 MG tablet Take 1 tablet (25 mg total) by mouth daily.   Bempedoic Acid (NEXLETOL) 180 MG TABS Take 1 tablet (180 mg total) by mouth daily.   Evolocumab (REPATHA SURECLICK) 140 MG/ML SOAJ Inject 140 mg into the skin every 14 (fourteen) days.   ezetimibe (ZETIA) 10 MG tablet Take 1 tablet (10 mg total) by mouth daily.   FLUoxetine (PROZAC) 40 MG capsule Take 1 capsule (40 mg total) by mouth daily.   losartan-hydrochlorothiazide (HYZAAR) 100-12.5 MG tablet Take 1 tablet by mouth daily.   tirzepatide (ZEPBOUND) 10 MG/0.5ML Pen Inject 10 mg into the skin once a week.   No facility-administered encounter medications on file as of 08/24/2023.     Follow-Up   Return in about 7 weeks (around 10/12/2023) for For Weight Mangement with Dr. Rikki Spearing.Marland Kitchen She was informed of the importance of frequent follow up visits to maximize her success with intensive lifestyle modifications for her multiple health conditions.  Attestation Statement   Reviewed by clinician on day of visit: allergies, medications, problem list, medical history, surgical history, family history, social history, and previous encounter notes.     Worthy Rancher, MD

## 2023-08-24 NOTE — Assessment & Plan Note (Signed)
 Improved on Zepbound  She has increased orexigenic signaling, impaired satiety and inhibitory control. This is secondary to an abnormal energy regulation system and pathological neurohormonal pathways characteristic of excess adiposity.  In addition to nutritional and behavioral strategies she benefits from pharmacotherapy.  Continue Zepbound at current dose

## 2023-08-24 NOTE — Assessment & Plan Note (Signed)
 Patient reports good adherence with CPAP therapy.  She has lost 20% of total body weight and may benefit from a repeat sleep study to see if she still needs CPAP.  Continue treatment with Zepbound for weight management

## 2023-08-24 NOTE — Assessment & Plan Note (Signed)
 Blood pressure is well-controlled.  She is currently on Hyzaar, amlodipine and atenolol.  She will continue current regimen.  Monitor for orthostasis as she continues to lose weight

## 2023-08-25 ENCOUNTER — Encounter: Payer: Self-pay | Admitting: Internal Medicine

## 2023-08-28 NOTE — Telephone Encounter (Signed)
 Pt scheduled

## 2023-08-29 ENCOUNTER — Ambulatory Visit (INDEPENDENT_AMBULATORY_CARE_PROVIDER_SITE_OTHER): Admitting: Internal Medicine

## 2023-08-29 ENCOUNTER — Ambulatory Visit (INDEPENDENT_AMBULATORY_CARE_PROVIDER_SITE_OTHER)

## 2023-08-29 ENCOUNTER — Encounter: Payer: Self-pay | Admitting: Internal Medicine

## 2023-08-29 VITALS — BP 130/82 | HR 71 | Temp 98.3°F | Ht 66.0 in | Wt 177.0 lb

## 2023-08-29 DIAGNOSIS — G4733 Obstructive sleep apnea (adult) (pediatric): Secondary | ICD-10-CM | POA: Diagnosis not present

## 2023-08-29 DIAGNOSIS — I10 Essential (primary) hypertension: Secondary | ICD-10-CM

## 2023-08-29 DIAGNOSIS — M546 Pain in thoracic spine: Secondary | ICD-10-CM

## 2023-08-29 DIAGNOSIS — M549 Dorsalgia, unspecified: Secondary | ICD-10-CM | POA: Diagnosis not present

## 2023-08-29 MED ORDER — TIZANIDINE HCL 4 MG PO TABS: 4.0000 mg | ORAL_TABLET | Freq: Every evening | ORAL | 0 refills | Status: DC | PRN

## 2023-08-29 NOTE — Progress Notes (Signed)
 Subjective:    Patient ID: Sherry Davila, female    DOB: Jan 09, 1946, 78 y.o.   MRN: [id number]  Patient here for  Chief Complaint  Patient presents with   Back Pain    Pain starts in right shoulder blade travels over to left shoulder blade and down to about the Patient's mid back Movement, walking & standing make the pain worse    HPI Here for work in appt - work in with concerns regarding back pain. Persistent intermittent back pain. Hurts worse if on her feel for a long period of time. MRI 02/2022 - Asymmetric facet hypertrophy on the left at L1-2 with a probable medially projecting osteophyte deforming the thecal sac and causing potential left L1 nerve root encroachment. No other significant spinal stenosis or nerve root encroachment.  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 Moderate facet hypertrophy at L4-5 without resulting spinal stenosis or nerve root encroachment. Ibuprofen not helping. Back pain - localized more in the thoracic region. No radicular symptoms. No pain with deep breathing. No sob.    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   right upper arm above elbow, [facility]   Varicose vein    Past Surgical History:  Procedure Laterality Date   COLONOSCOPY WITH PROPOFOL N/A 09/08/2016   Procedure: COLONOSCOPY WITH PROPOFOL;  Surgeon: Luke Salaam, MD;  Location: [facility] ENDOSCOPY;  Service: Endoscopy;  Laterality: N/A;   COLONOSCOPY WITH PROPOFOL N/A 01/07/2020   Procedure: COLONOSCOPY WITH PROPOFOL;  Surgeon: Luke Salaam, MD;  Location[facility] ENDOSCOPY;  Service:  Gastroenterology;  Laterality: N/A;   FRACTURE SURGERY     foot, screws placed   JOINT REPLACEMENT Right 2009   knee   Family History  Problem Relation Age of Onset   Cancer Mother    Heart failure Mother    Heart disease Mother    Breast cancer Mother 61   Heart attack Mother    Obesity Father    Depression Father    Early death Father    Hyperlipidemia Father    AAA (abdominal aortic aneurysm) Father    Hypertension Father    Heart disease Father    Hypertension Sister    Social History   Socioeconomic History   Marital status: Married    Spouse name: Not on file   Number of children: Not on file   Years of education: Not on file   Highest education level: 12th grade  Occupational History   Not on file  Tobacco Use   Smoking status: Former    Current packs/day: 0.00    Types: Cigarettes    Quit date: 01/19/1988    Years since quitting: 35.6   Smokeless tobacco: Never  Vaping Use   Vaping status: Never Used  Substance and Sexual Activity   Alcohol use: Yes    Alcohol/week: 3.0 standard drinks of alcohol    Types: 3 Shots of liquor per week    Comment: 3 drinks a week socially    Drug  use: No   Sexual activity: Not on file  Other Topics Concern   Not on file  Social History Narrative   Married   Social Drivers of Health   Financial Resource Strain: Low Risk  (07/22/2023)   Overall Financial Resource Strain (CARDIA)    Difficulty of Paying Living Expenses: Not hard at all  Food Insecurity: No Food Insecurity (07/22/2023)   Hunger Vital Sign    Worried About Running Out of Food in the Last Year: Never true    Ran Out of Food in the Last Year: Never true  Transportation Needs: No Transportation Needs (07/22/2023)   PRAPARE - Administrator, Civil Service (Medical): No    Lack of Transportation (Non-Medical): No  Physical Activity: Insufficiently Active (07/22/2023)   Exercise Vital Sign    Days of Exercise per Week: 2 days    Minutes of Exercise per  Session: 10 min  Stress: No Stress Concern Present (07/22/2023)   [employer] of Occupational Health - Occupational Stress Questionnaire    Feeling of Stress : Not at all  Social Connections: Socially Integrated (07/22/2023)   Social Connection and Isolation Panel [NHANES]    Frequency of Communication with Friends and Family: Twice a week    Frequency of Social Gatherings with Friends and Family: Once a week    Attends Religious Services: 1 to 4 times per year    Active Member of [employer] or Organizations: Yes    Attends Engineer, structural: More than 4 times per year    Marital Status: Married     Review of Systems  Constitutional:  Negative for appetite change and unexpected weight change.  HENT:  Negative for congestion and sinus pressure.   Respiratory:  Negative for cough, chest tightness and shortness of breath.   Cardiovascular:  Negative for chest pain, palpitations and leg swelling.  Gastrointestinal:  Negative for abdominal pain, diarrhea, nausea and vomiting.  Genitourinary:  Negative for difficulty urinating and dysuria.  Musculoskeletal:  Positive for back pain. Negative for myalgias.  Skin:  Negative for color change and rash.  Neurological:  Negative for dizziness and headaches.  Psychiatric/Behavioral:  Negative for agitation and dysphoric mood.        Objective:     BP 130/82   Pulse 71   Temp 98.3 F (36.8 C)   Ht 5\' 6"  (1.676 m)   Wt 177 lb (80.3 kg)   SpO2 99%   BMI 28.57 kg/m  Wt Readings from Last 3 Encounters:  08/29/23 177 lb (80.3 kg)  08/24/23 172 lb (78 kg)  07/27/23 179 lb (81.2 kg)    Physical Exam Vitals reviewed.  Constitutional:      General: She is not in acute distress.    Appearance: Normal appearance.  HENT:     Head: Normocephalic and atraumatic.     Right Ear: External ear normal.     Left Ear: External ear normal.     Mouth/Throat:     Pharynx: No oropharyngeal exudate or posterior oropharyngeal erythema.   Eyes:     General: No scleral icterus.       Right eye: No discharge.        Left eye: No discharge.     Conjunctiva/sclera: Conjunctivae normal.  Neck:     Thyroid: No thyromegaly.  Cardiovascular:     Rate and Rhythm: Normal rate and regular rhythm.  Pulmonary:     Effort: No respiratory distress.     Breath  sounds: Normal breath sounds. No wheezing.  Abdominal:     General: Bowel sounds are normal.     Palpations: Abdomen is soft.     Tenderness: There is no abdominal tenderness.  Musculoskeletal:        General: No swelling.     Cervical back: Neck supple. No tenderness.     Comments: No pain with SLR. No pain to palpation over the spine.   Lymphadenopathy:     Cervical: No cervical adenopathy.  Skin:    Findings: No erythema or rash.  Neurological:     Mental Status: She is alert.  Psychiatric:        Mood and Affect: Mood normal.        Behavior: Behavior normal.         Outpatient Encounter Medications as of 08/29/2023  Medication Sig   amLODipine (NORVASC) 10 MG tablet Take 1 tablet (10 mg total) by mouth daily.   atenolol (TENORMIN) 25 MG tablet Take 1 tablet (25 mg total) by mouth daily.   Bempedoic Acid (NEXLETOL) 180 MG TABS Take 1 tablet (180 mg total) by mouth daily.   Evolocumab (REPATHA SURECLICK) 140 MG/ML SOAJ Inject 140 mg into the skin every 14 (fourteen) days.   FLUoxetine (PROZAC) 40 MG capsule Take 1 capsule (40 mg total) by mouth daily.   losartan-hydrochlorothiazide (HYZAAR) 100-12.5 MG tablet Take 1 tablet by mouth daily.   tirzepatide (ZEPBOUND) 10 MG/0.5ML Pen Inject 10 mg into the skin once a week.   tiZANidine (ZANAFLEX) 4 MG tablet Take 1 tablet (4 mg total) by mouth at bedtime as needed for muscle spasms.   [DISCONTINUED] ezetimibe (ZETIA) 10 MG tablet Take 1 tablet (10 mg total) by mouth daily.   No facility-administered encounter medications on file as of 08/29/2023.     Lab Results  Component Value Date   WBC 6.0 01/18/2023   HGB  14.2 01/18/2023   HCT 43.9 01/18/2023   PLT 260 01/18/2023   GLUCOSE 85 07/24/2023   CHOL 224 (H) 07/24/2023   TRIG 72.0 07/24/2023   HDL 94.60 07/24/2023   LDLCALC 115 (H) 07/24/2023   ALT 14 07/24/2023   AST 16 07/24/2023   NA 139 07/24/2023   K 4.3 07/24/2023   CL 103 07/24/2023   CREATININE 0.52 07/24/2023   BUN 18 07/24/2023   CO2 28 07/24/2023   TSH 2.30 03/23/2023   HGBA1C 4.9 07/24/2023    DG Chest 2 View Result Date: 01/18/2023 CLINICAL DATA:  Shortness of breath EXAM: CHEST - 2 VIEW COMPARISON:  X-ray 08/15/2022 FINDINGS: The heart size and mediastinal contours are within normal limits. Hyperinflation. No consolidation, pneumothorax or effusion. No edema. Tortuous and ectatic aorta. The visualized skeletal structures are unremarkable. Degenerative changes of the spine. IMPRESSION: Hyperinflation.  No acute cardiopulmonary disease Electronically Signed   By: Adrianna Horde M.D.   On: 01/18/2023 10:22       Assessment & Plan:  Thoracic back pain, unspecified back pain laterality, unspecified chronicity Assessment & Plan: Thoracic back pain - persistent. Aggravated if stands for long periods. Check xray. Tizanidine as directed. Follow. Call with update. Consider PT.   Orders: -     DG Thoracic Spine 2 View; Future  OSA on CPAP Assessment & Plan: Continue cpap. Has had good adherence.    Essential hypertension Assessment & Plan: Continue Hyzaar, amlodipine and atenolol.  Follow pressures.    Other orders -     tiZANidine HCl; Take 1 tablet (4 mg  total) by mouth at bedtime as needed for muscle spasms.  Dispense: 10 tablet; Refill: 0     Dellar Fenton, MD

## 2023-08-30 ENCOUNTER — Other Ambulatory Visit: Payer: Self-pay | Admitting: Internal Medicine

## 2023-08-30 ENCOUNTER — Telehealth: Payer: Self-pay

## 2023-08-30 DIAGNOSIS — M5489 Other dorsalgia: Secondary | ICD-10-CM

## 2023-08-30 NOTE — Progress Notes (Signed)
Order placed for PT referral.  

## 2023-08-30 NOTE — Telephone Encounter (Signed)
 Copied from CRM [id number]. Topic: General - Other >> Aug 30, 2023 10:30 AM [name] wrote: Reason for CRM: Patient returned call to [facility] Sherry Davila regarding xray results.I relayed message from Dr Lorin Picket. Patient is agreeable with Physical Therapy

## 2023-09-03 ENCOUNTER — Encounter: Payer: Self-pay | Admitting: Internal Medicine

## 2023-09-03 NOTE — Assessment & Plan Note (Signed)
 Thoracic back pain - persistent. Aggravated if stands for long periods. Check xray. Tizanidine as directed. Follow. Call with update. Consider PT.

## 2023-09-03 NOTE — Assessment & Plan Note (Signed)
 Continue cpap. Has had good adherence.

## 2023-09-03 NOTE — Assessment & Plan Note (Signed)
 Continue Hyzaar, amlodipine and atenolol.  Follow pressures.

## 2023-09-25 ENCOUNTER — Other Ambulatory Visit: Payer: Self-pay

## 2023-09-27 DIAGNOSIS — H353221 Exudative age-related macular degeneration, left eye, with active choroidal neovascularization: Secondary | ICD-10-CM | POA: Diagnosis not present

## 2023-09-28 ENCOUNTER — Other Ambulatory Visit: Payer: Self-pay

## 2023-09-28 MED ORDER — ATENOLOL 25 MG PO TABS: 25.0000 mg | ORAL_TABLET | Freq: Every day | ORAL | 3 refills | Status: AC

## 2023-09-28 MED ORDER — ATENOLOL 25 MG PO TABS: 25.0000 mg | ORAL_TABLET | Freq: Every day | ORAL | 3 refills | Status: DC

## 2023-10-20 ENCOUNTER — Other Ambulatory Visit: Payer: Self-pay | Admitting: Internal Medicine

## 2023-10-20 ENCOUNTER — Telehealth: Payer: Self-pay | Admitting: Internal Medicine

## 2023-10-20 NOTE — Telephone Encounter (Signed)
  Dr Geralyn Knee will not be in the office the day of your scheduled visit 12/07/2023 . Please give the office a call at [phone] and we will get you rescheduled.  Thank you  E2C2 please reschedule this pt when they call back -[facility]

## 2023-10-23 ENCOUNTER — Encounter (INDEPENDENT_AMBULATORY_CARE_PROVIDER_SITE_OTHER): Payer: Self-pay | Admitting: Internal Medicine

## 2023-10-23 ENCOUNTER — Ambulatory Visit (INDEPENDENT_AMBULATORY_CARE_PROVIDER_SITE_OTHER): Admitting: Internal Medicine

## 2023-10-23 ENCOUNTER — Encounter: Payer: Self-pay | Admitting: Internal Medicine

## 2023-10-23 VITALS — BP 131/86 | HR 68 | Temp 97.3°F | Ht 66.0 in | Wt 167.0 lb

## 2023-10-23 DIAGNOSIS — M479 Spondylosis, unspecified: Secondary | ICD-10-CM

## 2023-10-23 DIAGNOSIS — I10 Essential (primary) hypertension: Secondary | ICD-10-CM

## 2023-10-23 DIAGNOSIS — G4730 Sleep apnea, unspecified: Secondary | ICD-10-CM

## 2023-10-23 DIAGNOSIS — Z6826 Body mass index (BMI) 26.0-26.9, adult: Secondary | ICD-10-CM

## 2023-10-23 DIAGNOSIS — M5489 Other dorsalgia: Secondary | ICD-10-CM

## 2023-10-23 DIAGNOSIS — M549 Dorsalgia, unspecified: Secondary | ICD-10-CM

## 2023-10-23 DIAGNOSIS — R0781 Pleurodynia: Secondary | ICD-10-CM | POA: Diagnosis not present

## 2023-10-23 DIAGNOSIS — E7800 Pure hypercholesterolemia, unspecified: Secondary | ICD-10-CM

## 2023-10-23 DIAGNOSIS — M546 Pain in thoracic spine: Secondary | ICD-10-CM

## 2023-10-23 MED ORDER — MULTI-VITAMIN/MINERALS PO TABS: 1.0000 | ORAL_TABLET | Freq: Every day | ORAL | Status: AC

## 2023-10-23 NOTE — Assessment & Plan Note (Signed)
 Patient has lost 57 pounds or 24% of total body weight since starting our program.  She is following a 1200-calorie Mediterranean plan has been increasing her protein intake and increasing volume of physical activity which has resulted in additional weight loss.  She will continue with current weight management strategy inclusive of GLP-1 therapy.  Currently on Zepbound .  Continue multivitamin with minerals

## 2023-10-23 NOTE — Progress Notes (Signed)
 Office: [phone]  /  Fax: [phone]  Weight Summary And Biometrics  Vitals Temp: (!) 97.3 F (36.3 C) BP: 131/86 Pulse Rate: 68 SpO2: 98 %   Anthropometric Measurements Height: 5\' 6"  (1.676 m) Weight: 167 lb (75.8 kg) BMI (Calculated): 26.97 Weight at Last Visit: 172 lb Weight Lost Since Last Visit: 5 lb Weight Gained Since Last Visit: 0 lb Starting Weight: 224 lb Total Weight Loss (lbs): 57 lb (25.9 kg) Peak Weight: 233 lb   Body Composition  Body Fat %: 32.8 % Fat Mass (lbs): 54.8 lbs Muscle Mass (lbs): 106.6 lbs Total Body Water (lbs): 68.4 lbs Visceral Fat Rating : 10    No data recorded Today's Visit #: 17  Starting Date: 12/23/21   Subjective   Chief Complaint: Obesity  Interval History  Discussed the use of AI scribe software for clinical note transcription with the patient, who gave verbal consent to proceed.  History of Present Illness   Sherry Davila is a 78 year old female who presents for medical weight management.  She has lost five pounds since her last visit and is following a 1200 calorie meal plan, tracking her intake, consuming more whole foods, ensuring adequate protein intake, and maintaining hydration. She typically eats two meals a day. Her exercise routine includes two days a week of strengthening cardio and yoga for about fifty minutes each session. She reports adequate sleep.  With a current weight of 167 pounds. Her goal weight is 150 pounds, having lost 29% of her peak weight of 233 pounds. She is taking a multivitamin with minerals and Zepbound  10 mg, which is covered by insurance due to her sleep apnea. She also takes amlodipine  and losartan  (Hyzaar) for blood pressure management.  She experiences back pain under the shoulder blade on the left, worsening throughout the day with activities such as walking and cooking. The pain is not associated with arm movement or injury. She previously took Repatha  but discontinued it  due to concerns about back pain as a side effect. Muscle relaxants and ibuprofen have not alleviated the pain. No pain with arm movement, injury, or pain upon pressure. Pressure on the painful area sometimes helps. No pain with deep breathing.  Her blood pressure is reported as 131/86. She recently resumed taking amlodipine  after a period without it due to running out of medication. She is also taking losartan  (Hyzaar) regularly.       Challenges affecting patient progress: none.    Pharmacotherapy for weight management: She is currently taking Zepbound  with adequate clinical response  and without side effects..   Assessment and Plan   Treatment Plan For Obesity:  Recommended Dietary Goals  Kattaleya is currently in the action stage of change. As such, her goal is to continue weight management plan. She has agreed to: continue current plan  Behavioral Health and Counseling  We discussed the following behavioral modification strategies today: continue to work on maintaining a reduced calorie state, getting the recommended amount of protein, incorporating whole foods, making healthy choices, staying well hydrated and practicing mindfulness when eating..  Additional education and resources provided today: None  Recommended Physical Activity Goals  Marshal has been advised to work up to 150 minutes of moderate intensity aerobic activity a week and strengthening exercises 2-3 times per week for cardiovascular health, weight loss maintenance and preservation of muscle mass.   She has agreed to :  continue to gradually increase the amount and intensity of exercise routine  Pharmacotherapy  We discussed various medication options to help Ted with her weight loss efforts and we both agreed to : adequate clinical response to anti-obesity medication, continue current regimen and do not recommend further increases in GLP-1 due to adequate clinical response   Associated Conditions Impacted by  Obesity Treatment  Essential hypertension Assessment & Plan:  Blood pressure close to goal for age and comorbid conditions.  She had a recent labs and amlodipine .  She is currently on Hyzaar, amlodipine  and atenolol .  She will continue current regimen.  Monitor for orthostasis as she continues to lose weight   Pure hypercholesterolemia Assessment & Plan: Patient discontinued Repatha  thinking this was the cause of her left-sided infrascapular pain.  Discussed that this is likely musculoskeletal and not related to medication, she been frets from lipid-lowering therapies with advised to resume.   Rib pain on left side - infrascapular pain Assessment & Plan: Ongoing and not improved.  Have been taking NSAIDs and muscle relaxants prescribed by another provider without relief.  She has tenderness over ribs in the infrascapular region.  Differential was broad including musculoskeletal causes possible scapulothoracic bursitis, postural strain, thoracic radiculopathy or less common internal.  Recommend she schedule an appointment with primary care team for follow-up  imaging.   Morbid obesity (HCC) with starting BMI 37 Assessment & Plan: Patient has lost 57 pounds or 24% of total body weight since starting our program.  She is following a 1200-calorie Mediterranean plan has been increasing her protein intake and increasing volume of physical activity which has resulted in additional weight loss.  She will continue with current weight management strategy inclusive of GLP-1 therapy.  Currently on Zepbound .  Continue multivitamin with minerals  Orders: -     Multi-Vitamin/Minerals; Take 1 tablet by mouth daily.     Assessment and Plan    Obesity Obesity is managed with a 1200 calorie meal plan and Zepbound . She has lost 5 pounds since the last visit, with a current BMI of 26, down from 37. Visceral fat has decreased from 16 to 10. She is consuming more whole foods, maintaining adequate hydration,  and exercising twice a week.  Patient goal is to reach 150 pounds. Discussed the chronic nature of obesity and the need for long-term management. Emphasized the importance of maintaining a routine with water, fiber, and protein intake to prevent weight regain if medication is discontinued. - Continue 1200 calorie meal plan - Continue Zepbound  10 mg - Encourage increased physical activity, including strengthening exercises and cardio - Monitor weight and BMI - Discuss potential long-term use of Zepbound   Hypertension Blood pressure is currently 131/86 mmHg. She is on amlodipine  and losartan  (Hyzaar). There was a recent lapse in amlodipine  due to running out of medication, but it has been refilled. 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 Pain is not relieved by muscle relaxants or ibuprofen. Differential includes musculoskeletal pain versus other causes such as rib or lung pathology. Discussed the possibility of imaging to rule out other causes. Advised to contact PCP for further evaluation and potential imaging to rule out rib or lung pathology. 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 Current back pain is different from previous arthritis symptoms, located under the shoulder blade.  Sleep apnea Sleep  apnea is managed and is a contributing factor for insurance coverage of Zepbound .  General Health Maintenance She is taking a multivitamin with minerals. Discussed the importance of maintaining a balanced diet with adequate fiber, protein, and hydration. - Continue multivitamin with minerals - Encourage a balanced diet with adequate fiber, protein, and hydration  Recording duration: 18 minutes        Objective   Physical  Exam:  Blood pressure 131/86, pulse 68, temperature (!) 97.3 F (36.3 C), height 5\' 6"  (1.676 m), weight 167 lb (75.8 kg), SpO2 98%. Body mass index is 26.95 kg/m.  General: She is overweight, cooperative, alert, well developed, and in no acute distress. PSYCH: Has normal mood, affect and thought process.   HEENT: EOMI, sclerae are anicteric. Lungs: Normal breathing effort, no conversational dyspnea. Extremities: No edema.  Neurologic: No gross sensory or motor deficits. No tremors or fasciculations noted.    Diagnostic Data Reviewed:  BMET    Component Value Date/Time   NA 139 07/24/2023 0918   NA 140 04/09/2018 1126   K 4.3 07/24/2023 0918   CL 103 07/24/2023 0918   CO2 28 07/24/2023 0918   GLUCOSE 85 07/24/2023 0918   BUN 18 07/24/2023 0918   BUN 22 04/09/2018 1126   CREATININE 0.52 07/24/2023 0918   CALCIUM  9.3 07/24/2023 0918   GFRNONAA >60 01/18/2023 1000   GFRAA 108 04/09/2018 1126   Lab Results  Component Value Date   HGBA1C 4.9 07/24/2023   HGBA1C 5.6 10/07/2020   No results found for: "INSULIN " Lab Results  Component Value Date   TSH 2.30 03/23/2023   CBC    Component Value Date/Time   WBC 6.0 01/18/2023 1000   RBC 4.82 01/18/2023 1000   HGB 14.2 01/18/2023 1000   HGB 14.1 09/28/2017 1557   HCT 43.9 01/18/2023 1000   HCT 41.8 09/28/2017 1557   PLT 260 01/18/2023 1000   PLT 234 09/28/2017 1557   MCV 91.1 01/18/2023 1000   MCV 91 09/28/2017 1557   MCH 29.5 01/18/2023 1000   MCHC 32.3 01/18/2023 1000   RDW 14.3 01/18/2023 1000   RDW 14.6 09/28/2017 1557   Iron Studies No results found for: "IRON", "TIBC", "FERRITIN", "IRONPCTSAT" Lipid Panel     Component Value Date/Time   CHOL 224 (H) 07/24/2023 0918   CHOL 177 04/09/2018 1124   TRIG 72.0 07/24/2023 0918   TRIG 83 04/09/2018 1124   HDL 94.60 07/24/2023 0918   HDL 109 09/28/2017 1557   CHOLHDL 2 07/24/2023 0918   VLDL 14.4 07/24/2023 0918   VLDL 17 04/09/2018 1124   LDLCALC 115 (H)  07/24/2023 0918   LDLCALC 110 (H) 09/28/2017 1557   Hepatic Function Panel     Component Value Date/Time   PROT 6.7 07/24/2023 0918   PROT 7.0 09/28/2017 1557   ALBUMIN 4.1 07/24/2023 0918   ALBUMIN 4.7 09/28/2017 1557   AST 16 07/24/2023 0918   AST 28 04/09/2018 1124   ALT 14 07/24/2023 0918   ALT 29 04/09/2018 1124   ALKPHOS 78 07/24/2023 0918   BILITOT 0.6 07/24/2023 0918   BILITOT 0.4 09/28/2017 1557   BILIDIR 0.1 07/24/2023 0918      Component Value Date/Time   TSH 2.30 03/23/2023 0926   Nutritional Lab Results  Component Value Date   VD25OH 24.2 (L) 12/23/2021    Medications: Outpatient Encounter Medications as of 10/23/2023  Medication Sig   amLODipine  (NORVASC ) 10 MG tablet Take 1 tablet (10 mg total) by  mouth daily.   atenolol  (TENORMIN ) 25 MG tablet Take 1 tablet (25 mg total) by mouth daily.   Bempedoic Acid  (NEXLETOL ) 180 MG TABS Take 1 tablet (180 mg total) by mouth daily.   FLUoxetine  (PROZAC ) 40 MG capsule Take 1 capsule (40 mg total) by mouth daily.   losartan -hydrochlorothiazide (HYZAAR) 100-12.5 MG tablet Take 1 tablet by mouth daily.   Multiple Vitamins-Minerals (MULTIVITAMIN WITH MINERALS) tablet Take 1 tablet by mouth daily.   tirzepatide  (ZEPBOUND ) 10 MG/0.5ML Pen Inject 10 mg into the skin once a week.   tiZANidine  (ZANAFLEX ) 4 MG tablet Take 1 tablet (4 mg total) by mouth at bedtime as needed for muscle spasms.   REPATHA  SURECLICK 140 MG/ML SOAJ INJECT 1 PEN INTO SKIN EVERY 14 DAYS (Patient not taking: Reported on 10/23/2023)   [DISCONTINUED] Evolocumab  (REPATHA  SURECLICK) 140 MG/ML SOAJ Inject 140 mg into the skin every 14 (fourteen) days.   No facility-administered encounter medications on file as of 10/23/2023.     Follow-Up   Return in about 5 weeks (around 11/27/2023) for For Weight Mangement with Dr. Allie Area.Aaron Aas She was informed of the importance of frequent follow up visits to maximize her success with intensive lifestyle modifications for her  multiple health conditions.  Attestation Statement   Reviewed by clinician on day of visit: allergies, medications, problem list, medical history, surgical history, family history, social history, and previous encounter notes.     Ladd Picker, MD

## 2023-10-23 NOTE — Assessment & Plan Note (Addendum)
 Ongoing and not improved.  Have been taking NSAIDs and muscle relaxants prescribed by another provider without relief.  She has tenderness over ribs in the infrascapular region.  Differential was broad including musculoskeletal causes possible scapulothoracic bursitis, postural strain, thoracic radiculopathy or less common internal.  Recommend she schedule an appointment with primary care team for follow-up  imaging.

## 2023-10-23 NOTE — Assessment & Plan Note (Signed)
 Patient discontinued Repatha  thinking this was the cause of her left-sided infrascapular pain.  Discussed that this is likely musculoskeletal and not related to medication, she been frets from lipid-lowering therapies with advised to resume.

## 2023-10-23 NOTE — Assessment & Plan Note (Signed)
 Blood pressure close to goal for age and comorbid conditions.  She had a recent labs and amlodipine .  She is currently on Hyzaar, amlodipine  and atenolol .  She will continue current regimen.  Monitor for orthostasis as she continues to lose weight

## 2023-10-24 ENCOUNTER — Telehealth: Payer: Self-pay

## 2023-10-24 DIAGNOSIS — H43813 Vitreous degeneration, bilateral: Secondary | ICD-10-CM | POA: Diagnosis not present

## 2023-10-24 DIAGNOSIS — H353112 Nonexudative age-related macular degeneration, right eye, intermediate dry stage: Secondary | ICD-10-CM | POA: Diagnosis not present

## 2023-10-24 DIAGNOSIS — H35371 Puckering of macula, right eye: Secondary | ICD-10-CM | POA: Diagnosis not present

## 2023-10-24 DIAGNOSIS — H353222 Exudative age-related macular degeneration, left eye, with inactive choroidal neovascularization: Secondary | ICD-10-CM | POA: Diagnosis not present

## 2023-10-24 DIAGNOSIS — H353123 Nonexudative age-related macular degeneration, left eye, advanced atrophic without subfoveal involvement: Secondary | ICD-10-CM | POA: Diagnosis not present

## 2023-10-24 NOTE — Telephone Encounter (Signed)
 Can place an order for referral to ortho at [facility], but I am not sure if Dr Aubry Blase sees for back pain. Is she going to be ok with seeing another orthopedist at [facility].

## 2023-10-24 NOTE — Telephone Encounter (Signed)
 See my chart message

## 2023-10-24 NOTE — Telephone Encounter (Signed)
 Copied from CRM [id number]. Topic: Referral - Request for Referral >> Oct 24, 2023  2:08 PM Baldo Levan wrote: Did the patient discuss referral with their provider in the last year? Yes (If No - schedule appointment) (If Yes - send message)  Appointment offered? No  Type of order/referral and detailed reason for visit: Ortho, per Dr. Thayne Fine recommendations on 6/3 telephone encounter.   Preference of office, provider, location: Dr. Hooten may be spelled as Dr. Rosana Comings ( patient is not sure)   If referral order, have you been seen by this specialty before? Yes (If Yes, this issue or another issue? When? Where?  Can we respond through MyChart? Yes

## 2023-10-24 NOTE — Telephone Encounter (Signed)
 LM for patient to discuss her message. She saw you for this in April. No improvement. Do you want to see her again or suggest referral to specialty?

## 2023-10-24 NOTE — Telephone Encounter (Signed)
 Patient prefers Dr Aubry Blase. Ok to refer to him for back pain?

## 2023-10-24 NOTE — Telephone Encounter (Signed)
 Per last visit, she had xray and was given a muscle relaxer and referred to PT.  If she is having persistent pain despite conservative measures - medication and PT, then I recommend referral to ortho for further evaluation and treatment. Need to confirm if she has a preference of which orthopedist she prefers to see.

## 2023-11-09 ENCOUNTER — Other Ambulatory Visit: Payer: Self-pay | Admitting: Internal Medicine

## 2023-11-27 DIAGNOSIS — M546 Pain in thoracic spine: Secondary | ICD-10-CM | POA: Diagnosis not present

## 2023-11-29 ENCOUNTER — Ambulatory Visit (INDEPENDENT_AMBULATORY_CARE_PROVIDER_SITE_OTHER): Admitting: Internal Medicine

## 2023-11-29 ENCOUNTER — Encounter (INDEPENDENT_AMBULATORY_CARE_PROVIDER_SITE_OTHER): Payer: Self-pay | Admitting: Internal Medicine

## 2023-11-29 VITALS — BP 130/76 | HR 66 | Temp 98.8°F | Ht 66.0 in | Wt 166.0 lb

## 2023-11-29 DIAGNOSIS — E7800 Pure hypercholesterolemia, unspecified: Secondary | ICD-10-CM

## 2023-11-29 DIAGNOSIS — I10 Essential (primary) hypertension: Secondary | ICD-10-CM

## 2023-11-29 DIAGNOSIS — Z6826 Body mass index (BMI) 26.0-26.9, adult: Secondary | ICD-10-CM | POA: Diagnosis not present

## 2023-11-29 DIAGNOSIS — G4733 Obstructive sleep apnea (adult) (pediatric): Secondary | ICD-10-CM

## 2023-11-29 DIAGNOSIS — Z9189 Other specified personal risk factors, not elsewhere classified: Secondary | ICD-10-CM | POA: Diagnosis not present

## 2023-11-29 NOTE — Assessment & Plan Note (Signed)
 She has sleep apnea and uses CPAP. Her husband has noticed some improvement in snoring, but recent episodes have occurred. A repeat sleep study was mentioned as a possibility after significant weight loss, but it was noted that sleep apnea can persist regardless of weight. The sleep apnea diagnosis is currently aiding in medication coverage. - Consider repeat sleep study if symptoms persist

## 2023-11-29 NOTE — Assessment & Plan Note (Signed)
 Her LDL cholesterol was 115 mg/dL in March. She previously stopped taking Repatha  due to backache concerns but plans to resume it as it was not the cause of her symptoms. Managing cholesterol is important to reduce cardiovascular risk, which is calculated at 24% for cardiac events.

## 2023-11-29 NOTE — Assessment & Plan Note (Signed)
 She has achieved a significant weight loss of 58 pounds, which is 26% of her total body weight. Her body fat percentage has decreased from 39% to 37%, with a goal of less than 36%. Visceral fat has decreased from 16 to 10-11. She is currently on Zepbound  10 mg weekly, which she plans to continue until January. The importance of maintaining muscle mass was emphasized, and the potential for weight loss to slow as she approaches her fat loss goal was discussed. She is advised to maintain protein intake and hydration to prevent muscle loss and constipation. The possibility of reducing Zepbound  frequency to every two weeks was considered, but she decided to wait until January to implement this change. - Continue Zepbound  10 mg weekly - Maintain protein intake of at least 90 grams per day - Stay hydrated and consume adequate fiber - Consider reducing Zepbound  frequency to every two weeks starting January

## 2023-11-29 NOTE — Assessment & Plan Note (Signed)
 Hypertension is well-controlled with current medication regimen. Blood pressure today is 130/76 mmHg. She is on losartan  hydrochlorothiazide, amlodipine , and atenolol . Atenolol  may contribute to weight gain. - Continue current antihypertensive medications

## 2023-11-29 NOTE — Progress Notes (Signed)
 Office: [phone]  /  Fax: [phone]  Weight Summary and Body Composition Analysis (BIA)  Vitals Temp: 98.8 F (37.1 C) BP: 130/76 Pulse Rate: 66 SpO2: 98 %   Anthropometric Measurements Height: 5' 6 (1.676 m) Weight: 166 lb (75.3 kg) BMI (Calculated): 26.81 Weight at Last Visit: 167 lb Weight Lost Since Last Visit: 1 lb Weight Gained Since Last Visit: 0 lb Starting Weight: 224 lb Total Weight Loss (lbs): 58 lb (26.3 kg) Peak Weight: 233 lb   Body Composition  Body Fat %: 37.9 % Fat Mass (lbs): 63.2 lbs Muscle Mass (lbs): 98.2 lbs Total Body Water (lbs): 68.8 lbs Visceral Fat Rating : 11    RMR: 1411  Today's Visit #: 18  Starting Date: 12/23/21   Subjective   Chief Complaint: Obesity  Interval History Discussed the use of AI scribe software for clinical note transcription with the patient, who gave verbal consent to proceed.  History of Present Illness   Sherry Davila is a 78 year old female with obesity, sleep apnea, hypertension, and hyperlipidemia who presents for medical weight management.  Sherry Davila is currently on Zepbound  10 mg once a week for weight management. Sherry Davila has lost a total of 58 pounds, which is 26% of her total body weight, and her body fat percentage has decreased from 39% to 37%, with a goal of less than 36%. Her visceral fat has decreased from 16 to 10-11. Sherry Davila maintained her weight loss even during a two-week trip to [place] , despite missing a dose of Zepbound .  Sherry Davila is on CPAP therapy for sleep apnea. Her husband has noticed improvements in her snoring, although Sherry Davila experienced a recurrence recently, prompting her to resume using her CPAP machine.  For hypertension, Sherry Davila takes losartan  hydrochlorothiazide once daily, amlodipine  10 mg, and atenolol  25 mg daily.  Sherry Davila is managing hyperlipidemia and reports a previous adverse reaction to statins, which caused leg pain. Sherry Davila is considering resuming Repatha , which Sherry Davila has at home, after  discontinuing it due to back pain, which Sherry Davila later determined was unrelated.  Her family history includes heart disease in both parents; her mother likely died of a heart attack, and her father had an aortic aneurysm. Her father smoked in his youth but quit in his twenties.  Sherry Davila is trying to maintain adequate protein intake, aiming for at least 90 grams per day, and is aware of the importance of hydration and fiber intake to prevent constipation from her medication. Sherry Davila is also engaging in physical activity, including Pilates classes, and has a Photographer at [employer].       Challenges affecting patient progress: low volume of physical activity at present  and menopause.    Pharmacotherapy for weight management: Sherry Davila is currently taking Zepbound  with adequate clinical response  and without side effects..   Assessment and Plan   Treatment Plan For Obesity:  Recommended Dietary Goals  Sherry Davila is currently in the action stage of change. As such, her goal is to continue weight management plan. Sherry Davila has agreed to: continue current plan  Behavioral Health and Counseling  We discussed the following behavioral modification strategies today: continue to work on maintaining a reduced calorie state, getting the recommended amount of protein, incorporating whole foods, making healthy choices, staying well hydrated and practicing mindfulness when eating. and avoid all or none thinking.  Additional education and resources provided today: None  Recommended Physical Activity Goals  Sherry Davila has been advised to work up to 150 minutes of moderate intensity  aerobic activity a week and strengthening exercises 2-3 times per week for cardiovascular health, weight loss maintenance and preservation of muscle mass.   Sherry Davila has agreed to :  continue to gradually increase the amount and intensity of exercise routine  Medical Interventions and Pharmacotherapy  We discussed various medication options to help  Sherry Davila with her weight loss efforts and we both agreed to : Adequate clinical response to anti-obesity medication, continue current regimen  Associated Conditions Impacted by Obesity Treatment  Assessment & Plan Essential hypertension Hypertension is well-controlled with current medication regimen. Blood pressure today is 130/76 mmHg. Sherry Davila is on losartan  hydrochlorothiazide, amlodipine , and atenolol . Atenolol  may contribute to weight gain. - Continue current antihypertensive medications OSA on CPAP Sherry Davila has sleep apnea and uses CPAP. Her husband has noticed some improvement in snoring, but recent episodes have occurred. A repeat sleep study was mentioned as a possibility after significant weight loss, but it was noted that sleep apnea can persist regardless of weight. The sleep apnea diagnosis is currently aiding in medication coverage. - Consider repeat sleep study if symptoms persist Pure hypercholesterolemia Her LDL cholesterol was 115 mg/dL in March. Sherry Davila previously stopped taking Repatha  due to backache concerns but plans to resume it as it was not the cause of her symptoms. Managing cholesterol is important to reduce cardiovascular risk, which is calculated at 24% for cardiac events. At increased risk for cardiovascular disease Her LDL cholesterol was 115 mg/dL in March. Sherry Davila previously stopped taking Repatha  due to backache concerns but plans to resume it as it was not the cause of her symptoms. Managing cholesterol is important to reduce cardiovascular risk, which is calculated at 24% for cardiac events. Morbid obesity (HCC) with starting BMI 37 Sherry Davila has achieved a significant weight loss of 58 pounds, which is 26% of her total body weight. Her body fat percentage has decreased from 39% to 37%, with a goal of less than 36%. Visceral fat has decreased from 16 to 10-11. Sherry Davila is currently on Zepbound  10 mg weekly, which Sherry Davila plans to continue until January. The importance of maintaining muscle mass was  emphasized, and the potential for weight loss to slow as Sherry Davila approaches her fat loss goal was discussed. Sherry Davila is advised to maintain protein intake and hydration to prevent muscle loss and constipation. The possibility of reducing Zepbound  frequency to every two weeks was considered, but Sherry Davila decided to wait until January to implement this change. - Continue Zepbound  10 mg weekly - Maintain protein intake of at least 90 grams per day - Stay hydrated and consume adequate fiber - Consider reducing Zepbound  frequency to every two weeks starting January    General Health Maintenance Bone health is important, especially after significant weight loss, and a bone density test was recommended. Sherry Davila has not had a recent bone density test. Cardiovascular risk was reviewed, noting a 24% risk of cardiac events, and the importance of managing risk factors was emphasized. - Schedule bone density test - Discuss cardiovascular risk management with primary care provider        Objective   Physical Exam:  Blood pressure 130/76, pulse 66, temperature 98.8 F (37.1 C), height 5' 6 (1.676 m), weight 166 lb (75.3 kg), SpO2 98%. Body mass index is 26.79 kg/m.  General: Sherry Davila is overweight, cooperative, alert, well developed, and in no acute distress. PSYCH: Has normal mood, affect and thought process.   HEENT: EOMI, sclerae are anicteric. Lungs: Normal breathing effort, no conversational dyspnea. Extremities: No edema.  Neurologic:  No gross sensory or motor deficits. No tremors or fasciculations noted.    Diagnostic Data Reviewed:  BMET    Component Value Date/Time   NA 139 07/24/2023 0918   NA 140 04/09/2018 1126   K 4.3 07/24/2023 0918   CL 103 07/24/2023 0918   CO2 28 07/24/2023 0918   GLUCOSE 85 07/24/2023 0918   BUN 18 07/24/2023 0918   BUN 22 04/09/2018 1126   CREATININE 0.52 07/24/2023 0918   CALCIUM  9.3 07/24/2023 0918   GFRNONAA >60 01/18/2023 1000   GFRAA 108 04/09/2018 1126   Lab  Results  Component Value Date   HGBA1C 4.9 07/24/2023   HGBA1C 5.6 10/07/2020   No results found for: INSULIN  Lab Results  Component Value Date   TSH 2.30 03/23/2023   CBC    Component Value Date/Time   WBC 6.0 01/18/2023 1000   RBC 4.82 01/18/2023 1000   HGB 14.2 01/18/2023 1000   HGB 14.1 09/28/2017 1557   HCT 43.9 01/18/2023 1000   HCT 41.8 09/28/2017 1557   PLT 260 01/18/2023 1000   PLT 234 09/28/2017 1557   MCV 91.1 01/18/2023 1000   MCV 91 09/28/2017 1557   MCH 29.5 01/18/2023 1000   MCHC 32.3 01/18/2023 1000   RDW 14.3 01/18/2023 1000   RDW 14.6 09/28/2017 1557   Iron Studies No results found for: IRON, TIBC, FERRITIN, IRONPCTSAT Lipid Panel     Component Value Date/Time   CHOL 224 (H) 07/24/2023 0918   CHOL 177 04/09/2018 1124   TRIG 72.0 07/24/2023 0918   TRIG 83 04/09/2018 1124   HDL 94.60 07/24/2023 0918   HDL 109 09/28/2017 1557   CHOLHDL 2 07/24/2023 0918   VLDL 14.4 07/24/2023 0918   VLDL 17 04/09/2018 1124   LDLCALC 115 (H) 07/24/2023 0918   LDLCALC 110 (H) 09/28/2017 1557   Hepatic Function Panel     Component Value Date/Time   PROT 6.7 07/24/2023 0918   PROT 7.0 09/28/2017 1557   ALBUMIN 4.1 07/24/2023 0918   ALBUMIN 4.7 09/28/2017 1557   AST 16 07/24/2023 0918   AST 28 04/09/2018 1124   ALT 14 07/24/2023 0918   ALT 29 04/09/2018 1124   ALKPHOS 78 07/24/2023 0918   BILITOT 0.6 07/24/2023 0918   BILITOT 0.4 09/28/2017 1557   BILIDIR 0.1 07/24/2023 0918      Component Value Date/Time   TSH 2.30 03/23/2023 0926   Nutritional Lab Results  Component Value Date   VD25OH 24.2 (L) 12/23/2021    Medications: Outpatient Encounter Medications as of 11/29/2023  Medication Sig   amLODipine  (NORVASC ) 10 MG tablet Take 1 tablet (10 mg total) by mouth daily.   atenolol  (TENORMIN ) 25 MG tablet Take 1 tablet (25 mg total) by mouth daily.   FLUoxetine  (PROZAC ) 40 MG capsule Take 1 capsule (40 mg total) by mouth daily.    losartan -hydrochlorothiazide (HYZAAR) 100-12.5 MG tablet Take 1 tablet by mouth daily.   Multiple Vitamins-Minerals (MULTIVITAMIN WITH MINERALS) tablet Take 1 tablet by mouth daily.   ZEPBOUND  10 MG/0.5ML Pen INJECT 10MG  SUBCUTANEOUSLY  ONCE A WEEK   [DISCONTINUED] Bempedoic Acid  (NEXLETOL ) 180 MG TABS Take 1 tablet (180 mg total) by mouth daily.   [DISCONTINUED] REPATHA  SURECLICK 140 MG/ML SOAJ INJECT 1 PEN INTO SKIN EVERY 14 DAYS (Patient not taking: Reported on 10/23/2023)   [DISCONTINUED] tiZANidine  (ZANAFLEX ) 4 MG tablet Take 1 tablet (4 mg total) by mouth at bedtime as needed for muscle spasms.   No facility-administered encounter  medications on file as of 11/29/2023.     Follow-Up   No follow-ups on file.SABRA Sherry Davila was informed of the importance of frequent follow up visits to maximize her success with intensive lifestyle modifications for her multiple health conditions.  Attestation Statement   Reviewed by clinician on day of visit: allergies, medications, problem list, medical history, surgical history, family history, social history, and previous encounter notes.     Lucas Parker, MD

## 2023-12-01 DIAGNOSIS — H35371 Puckering of macula, right eye: Secondary | ICD-10-CM | POA: Diagnosis not present

## 2023-12-01 DIAGNOSIS — H353222 Exudative age-related macular degeneration, left eye, with inactive choroidal neovascularization: Secondary | ICD-10-CM | POA: Diagnosis not present

## 2023-12-01 DIAGNOSIS — H353112 Nonexudative age-related macular degeneration, right eye, intermediate dry stage: Secondary | ICD-10-CM | POA: Diagnosis not present

## 2023-12-01 DIAGNOSIS — H353123 Nonexudative age-related macular degeneration, left eye, advanced atrophic without subfoveal involvement: Secondary | ICD-10-CM | POA: Diagnosis not present

## 2023-12-01 DIAGNOSIS — H43813 Vitreous degeneration, bilateral: Secondary | ICD-10-CM | POA: Diagnosis not present

## 2023-12-05 ENCOUNTER — Other Ambulatory Visit

## 2023-12-07 ENCOUNTER — Encounter: Admitting: Internal Medicine

## 2023-12-07 DIAGNOSIS — M546 Pain in thoracic spine: Secondary | ICD-10-CM | POA: Diagnosis not present

## 2023-12-07 DIAGNOSIS — M5459 Other low back pain: Secondary | ICD-10-CM | POA: Diagnosis not present

## 2023-12-12 DIAGNOSIS — M546 Pain in thoracic spine: Secondary | ICD-10-CM | POA: Diagnosis not present

## 2023-12-12 DIAGNOSIS — M5459 Other low back pain: Secondary | ICD-10-CM | POA: Diagnosis not present

## 2023-12-14 DIAGNOSIS — M5459 Other low back pain: Secondary | ICD-10-CM | POA: Diagnosis not present

## 2023-12-14 DIAGNOSIS — M546 Pain in thoracic spine: Secondary | ICD-10-CM | POA: Diagnosis not present

## 2023-12-18 DIAGNOSIS — M546 Pain in thoracic spine: Secondary | ICD-10-CM | POA: Diagnosis not present

## 2023-12-18 DIAGNOSIS — M5459 Other low back pain: Secondary | ICD-10-CM | POA: Diagnosis not present

## 2023-12-19 DIAGNOSIS — H353221 Exudative age-related macular degeneration, left eye, with active choroidal neovascularization: Secondary | ICD-10-CM | POA: Diagnosis not present

## 2023-12-21 DIAGNOSIS — M5459 Other low back pain: Secondary | ICD-10-CM | POA: Diagnosis not present

## 2023-12-21 DIAGNOSIS — M546 Pain in thoracic spine: Secondary | ICD-10-CM | POA: Diagnosis not present

## 2023-12-28 ENCOUNTER — Ambulatory Visit (INDEPENDENT_AMBULATORY_CARE_PROVIDER_SITE_OTHER): Admitting: Internal Medicine

## 2023-12-28 ENCOUNTER — Encounter (INDEPENDENT_AMBULATORY_CARE_PROVIDER_SITE_OTHER): Payer: Self-pay | Admitting: Internal Medicine

## 2023-12-28 VITALS — BP 136/84 | HR 65 | Temp 98.6°F | Ht 66.0 in | Wt 167.0 lb

## 2023-12-28 DIAGNOSIS — E669 Obesity, unspecified: Secondary | ICD-10-CM | POA: Diagnosis not present

## 2023-12-28 DIAGNOSIS — R638 Other symptoms and signs concerning food and fluid intake: Secondary | ICD-10-CM

## 2023-12-28 DIAGNOSIS — I10 Essential (primary) hypertension: Secondary | ICD-10-CM | POA: Diagnosis not present

## 2023-12-28 DIAGNOSIS — Z6826 Body mass index (BMI) 26.0-26.9, adult: Secondary | ICD-10-CM | POA: Diagnosis not present

## 2023-12-28 DIAGNOSIS — G4733 Obstructive sleep apnea (adult) (pediatric): Secondary | ICD-10-CM | POA: Diagnosis not present

## 2023-12-28 NOTE — Assessment & Plan Note (Signed)
 She has sleep apnea and uses CPAP.  She has lost 26% of total body weight.  Her husband has noticed some improvement in snoring,  - Consider repeat sleep study if symptoms persist -Continue current weight management strategy inclusive of Zepbound .

## 2023-12-28 NOTE — Assessment & Plan Note (Signed)
 Patient has lost 57 pounds or 26% of total body weight loss with weight management strategy inclusive of GLP-1.  Her body fat mass is down to less than 60 pounds so her weight loss rate will inherently decrease.  She will like to lose some additional weight.  This made improvements in body fat percentage as well as visceral fat rating.Current BMI is 26 with a body weight of 167 pounds. Consuming approximately 1200 calories daily, which is close to her metabolic rate of 8599 calories.  - Incorporate a protein shake and fruit in the morning to increase protein intake and potentially aid in weight loss. - Ensure adequate protein intake with lunch and dinner, aiming for 90 grams of protein daily. - Focus on maintaining current weight if desired, given the close caloric intake to metabolic rate.  -Continue Zepbound  at current dose no adverse effects.

## 2023-12-28 NOTE — Assessment & Plan Note (Signed)
 Improved on Zepbound  She has increased orexigenic signaling, impaired satiety and inhibitory control. This is secondary to an abnormal energy regulation system and pathological neurohormonal pathways characteristic of excess adiposity.  In addition to nutritional and behavioral strategies she benefits from pharmacotherapy.  Continue Zepbound at current dose

## 2023-12-28 NOTE — Progress Notes (Signed)
 Office: [phone]  /  Fax: [phone]  Weight Summary and Body Composition Analysis (BIA)  Vitals Temp: 98.6 F (37 C) BP: 136/84 Pulse Rate: 65 SpO2: 98 %   Anthropometric Measurements Height: 5' 6 (1.676 m) Weight: 167 lb (75.8 kg) BMI (Calculated): 26.97 Weight at Last Visit: 166 lb Weight Lost Since Last Visit: 0 lb Weight Gained Since Last Visit: 1 lb Starting Weight: 224 lb Total Weight Loss (lbs): 57 lb (25.9 kg) Peak Weight: 233 lb   Body Composition  Body Fat %: 36.3 % Fat Mass (lbs): 60.8 lbs Muscle Mass (lbs): 70 lbs Total Body Water (lbs): 70 lbs Visceral Fat Rating : 10    RMR: 1411  Today's Visit #: 19  Starting Date: 12/23/21   Subjective   Chief Complaint: Obesity  Interval History Discussed the use of AI scribe software for clinical note transcription with the patient, who gave verbal consent to proceed.  History of Present Illness   Sherry Davila is a 78 year old female presents for weight management she reports following a 1200-calorie nutrition plan with fair adherence she is tracking her calories eating more whole foods getting the recommended amount of protein maintaining adequate hydration she reports adequate sleep she is exercising 2 days a week 50 minutes doing Pilates.  She experiences prickly sensations and pain on one side of her back, initially suspected to be related to shingles. The pain is described as 'burping and hurts,' intensifying when standing or walking for extended periods, affecting her ability to perform daily activities such as cooking. Temporary relief is found with cocktails, which she feels 'relaxes something.'  She has been prescribed gabapentin , a white capsule, at a low dose of 100 mg. She reports no relief from the medication. However, she reports no relief from the medication.  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 She has been referred to physical therapy, which has not improved her  condition. She is scheduled for a follow-up appointment with her orthopedic specialist on the 15th.  She has lost weight and is concerned about her nutritional intake. She typically consumes two meals a day, lunch and dinner, and occasionally uses meal replacements like protein shakes or bars. She estimates her daily caloric intake to be around 1200 calories, which she believes is close to her metabolic rate of 8599 calories. She does not eat breakfast as she is not hungry in the morning.  She engages in Pilates for physical activity and is considering incorporating more protein into her diet to support her weight management goals. No skipped meals and reports a stable appetite.       Challenges affecting patient progress: none.    Pharmacotherapy for weight management: She is currently taking Zepbound  with adequate clinical response  and without side effects..   Assessment and Plan   Treatment Plan For Obesity:  Recommended Dietary Goals  Sherry Davila is currently in the action stage of change. As such, her goal is to continue weight management plan. She has agreed to: continue current plan  Behavioral Health and Counseling  We discussed the following behavioral modification strategies today: continue to work on maintaining a reduced calorie state, getting the recommended amount of protein, incorporating whole foods, making healthy choices, staying well hydrated and practicing mindfulness when eating..  Additional education and resources provided today: None  Recommended Physical Activity Goals  Sherry Davila has been advised to work up to 150 minutes of moderate intensity aerobic activity a week and strengthening exercises 2-3 times per  week for cardiovascular health, weight loss maintenance and preservation of muscle mass.   She has agreed to :  Think about enjoyable ways to increase daily physical activity and overcoming barriers to exercise and Increase physical activity in their day and  reduce sedentary time (increase NEAT).  Medical Interventions and Pharmacotherapy  We discussed various medication options to help Sherry Davila with her weight loss efforts and we both agreed to : Adequate clinical response to anti-obesity medication, continue current regimen  Associated Conditions Impacted by Obesity Treatment  Assessment & Plan Essential hypertension Hypertension is well-controlled with current medication regimen. Blood pressure today is 136/84 mmHg. She is on losartan  hydrochlorothiazide, amlodipine , and atenolol . Atenolol  may contribute to weight gain. - Continue current antihypertensive medications OSA on CPAP She has sleep apnea and uses CPAP.  She has lost 26% of total body weight.  Her husband has noticed some improvement in snoring,  - Consider repeat sleep study if symptoms persist -Continue current weight management strategy inclusive of Zepbound . Abnormal food appetite Improved on Zepbound   She has increased orexigenic signaling, impaired satiety and inhibitory control. This is secondary to an abnormal energy regulation system and pathological neurohormonal pathways characteristic of excess adiposity.  In addition to nutritional and behavioral strategies she benefits from pharmacotherapy.  Continue Zepbound  at current dose Generalized obesity -starting BMI of 36 Patient has lost 57 pounds or 26% of total body weight loss with weight management strategy inclusive of GLP-1.  Her body fat mass is down to less than 60 pounds so her weight loss rate will inherently decrease.  She will like to lose some additional weight.  This made improvements in body fat percentage as well as visceral fat rating.Current BMI is 26 with a body weight of 167 pounds. Consuming approximately 1200 calories daily, which is close to her metabolic rate of 8599 calories.  - Incorporate a protein shake and fruit in the morning to increase protein intake and potentially aid in weight loss. - Ensure  adequate protein intake with lunch and dinner, aiming for 90 grams of protein daily. - Focus on maintaining current weight if desired, given the close caloric intake to metabolic rate.  -Continue Zepbound  at current dose no adverse effects.    BMI 26.0-26.9,adult          Objective   Physical Exam:  Blood pressure 136/84, pulse 65, temperature 98.6 F (37 C), height 5' 6 (1.676 m), weight 167 lb (75.8 kg), SpO2 98%. Body mass index is 26.95 kg/m.  General: She is overweight, cooperative, alert, well developed, and in no acute distress. PSYCH: Has normal mood, affect and thought process.   HEENT: EOMI, sclerae are anicteric. Lungs: Normal breathing effort, no conversational dyspnea. Extremities: No edema.  Neurologic: No gross sensory or motor deficits. No tremors or fasciculations noted.    Diagnostic Data Reviewed:  BMET    Component Value Date/Time   NA 139 07/24/2023 0918   NA 140 04/09/2018 1126   K 4.3 07/24/2023 0918   CL 103 07/24/2023 0918   CO2 28 07/24/2023 0918   GLUCOSE 85 07/24/2023 0918   BUN 18 07/24/2023 0918   BUN 22 04/09/2018 1126   CREATININE 0.52 07/24/2023 0918   CALCIUM  9.3 07/24/2023 0918   GFRNONAA >60 01/18/2023 1000   GFRAA 108 04/09/2018 1126   Lab Results  Component Value Date   HGBA1C 4.9 07/24/2023   HGBA1C 5.6 10/07/2020   No results found for: INSULIN  Lab Results  Component Value Date   TSH  2.30 03/23/2023   CBC    Component Value Date/Time   WBC 6.0 01/18/2023 1000   RBC 4.82 01/18/2023 1000   HGB 14.2 01/18/2023 1000   HGB 14.1 09/28/2017 1557   HCT 43.9 01/18/2023 1000   HCT 41.8 09/28/2017 1557   PLT 260 01/18/2023 1000   PLT 234 09/28/2017 1557   MCV 91.1 01/18/2023 1000   MCV 91 09/28/2017 1557   MCH 29.5 01/18/2023 1000   MCHC 32.3 01/18/2023 1000   RDW 14.3 01/18/2023 1000   RDW 14.6 09/28/2017 1557   Iron Studies No results found for: IRON, TIBC, FERRITIN, IRONPCTSAT Lipid Panel      Component Value Date/Time   CHOL 224 (H) 07/24/2023 0918   CHOL 177 04/09/2018 1124   TRIG 72.0 07/24/2023 0918   TRIG 83 04/09/2018 1124   HDL 94.60 07/24/2023 0918   HDL 109 09/28/2017 1557   CHOLHDL 2 07/24/2023 0918   VLDL 14.4 07/24/2023 0918   VLDL 17 04/09/2018 1124   LDLCALC 115 (H) 07/24/2023 0918   LDLCALC 110 (H) 09/28/2017 1557   Hepatic Function Panel     Component Value Date/Time   PROT 6.7 07/24/2023 0918   PROT 7.0 09/28/2017 1557   ALBUMIN 4.1 07/24/2023 0918   ALBUMIN 4.7 09/28/2017 1557   AST 16 07/24/2023 0918   AST 28 04/09/2018 1124   ALT 14 07/24/2023 0918   ALT 29 04/09/2018 1124   ALKPHOS 78 07/24/2023 0918   BILITOT 0.6 07/24/2023 0918   BILITOT 0.4 09/28/2017 1557   BILIDIR 0.1 07/24/2023 0918      Component Value Date/Time   TSH 2.30 03/23/2023 0926   Nutritional Lab Results  Component Value Date   VD25OH 24.2 (L) 12/23/2021    Medications: Outpatient Encounter Medications as of 12/28/2023  Medication Sig   amLODipine  (NORVASC ) 10 MG tablet Take 1 tablet (10 mg total) by mouth daily.   atenolol  (TENORMIN ) 25 MG tablet Take 1 tablet (25 mg total) by mouth daily.   FLUoxetine  (PROZAC ) 40 MG capsule Take 1 capsule (40 mg total) by mouth daily.   gabapentin  (NEURONTIN ) 100 MG capsule Take 100 mg by mouth 3 (three) times daily.   losartan -hydrochlorothiazide (HYZAAR) 100-12.5 MG tablet Take 1 tablet by mouth daily.   Multiple Vitamins-Minerals (MULTIVITAMIN WITH MINERALS) tablet Take 1 tablet by mouth daily.   ZEPBOUND  10 MG/0.5ML Pen INJECT 10MG  SUBCUTANEOUSLY  ONCE A WEEK   No facility-administered encounter medications on file as of 12/28/2023.     Follow-Up   Return in about 6 weeks (around 02/08/2024).SABRA She was informed of the importance of frequent follow up visits to maximize her success with intensive lifestyle modifications for her multiple health conditions.  Attestation Statement   Reviewed by clinician on day of visit:  allergies, medications, problem list, medical history, surgical history, family history, social history, and previous encounter notes.     Lucas Parker, MD

## 2023-12-28 NOTE — Assessment & Plan Note (Signed)
 Hypertension is well-controlled with current medication regimen. Blood pressure today is 136/84 mmHg. She is on losartan  hydrochlorothiazide, amlodipine , and atenolol . Atenolol  may contribute to weight gain. - Continue current antihypertensive medications

## 2023-12-29 DIAGNOSIS — H353221 Exudative age-related macular degeneration, left eye, with active choroidal neovascularization: Secondary | ICD-10-CM | POA: Diagnosis not present

## 2023-12-29 DIAGNOSIS — H43813 Vitreous degeneration, bilateral: Secondary | ICD-10-CM | POA: Diagnosis not present

## 2023-12-29 DIAGNOSIS — H353112 Nonexudative age-related macular degeneration, right eye, intermediate dry stage: Secondary | ICD-10-CM | POA: Diagnosis not present

## 2023-12-29 DIAGNOSIS — H353123 Nonexudative age-related macular degeneration, left eye, advanced atrophic without subfoveal involvement: Secondary | ICD-10-CM | POA: Diagnosis not present

## 2023-12-29 DIAGNOSIS — H35371 Puckering of macula, right eye: Secondary | ICD-10-CM | POA: Diagnosis not present

## 2024-01-05 DIAGNOSIS — M546 Pain in thoracic spine: Secondary | ICD-10-CM | POA: Diagnosis not present

## 2024-01-08 ENCOUNTER — Other Ambulatory Visit: Payer: Self-pay | Admitting: Orthopedic Surgery

## 2024-01-08 DIAGNOSIS — M546 Pain in thoracic spine: Secondary | ICD-10-CM

## 2024-01-09 ENCOUNTER — Ambulatory Visit
Admission: RE | Admit: 2024-01-09 | Discharge: 2024-01-09 | Disposition: A | Discharge status: Home / Self Care | Source: Ambulatory Visit | Attending: Orthopedic Surgery | Admitting: Orthopedic Surgery

## 2024-01-09 DIAGNOSIS — M47814 Spondylosis without myelopathy or radiculopathy, thoracic region: Secondary | ICD-10-CM | POA: Diagnosis not present

## 2024-01-09 DIAGNOSIS — M546 Pain in thoracic spine: Secondary | ICD-10-CM | POA: Insufficient documentation

## 2024-01-09 DIAGNOSIS — R29890 Loss of height: Secondary | ICD-10-CM | POA: Diagnosis not present

## 2024-01-09 DIAGNOSIS — M5134 Other intervertebral disc degeneration, thoracic region: Secondary | ICD-10-CM | POA: Diagnosis not present

## 2024-01-09 DIAGNOSIS — M4854XA Collapsed vertebra, not elsewhere classified, thoracic region, initial encounter for fracture: Secondary | ICD-10-CM | POA: Diagnosis not present

## 2024-01-15 ENCOUNTER — Other Ambulatory Visit: Payer: Self-pay | Admitting: Internal Medicine

## 2024-01-19 DIAGNOSIS — Z23 Encounter for immunization: Secondary | ICD-10-CM | POA: Diagnosis not present

## 2024-02-20 DIAGNOSIS — H353123 Nonexudative age-related macular degeneration, left eye, advanced atrophic without subfoveal involvement: Secondary | ICD-10-CM | POA: Diagnosis not present

## 2024-02-20 DIAGNOSIS — H353221 Exudative age-related macular degeneration, left eye, with active choroidal neovascularization: Secondary | ICD-10-CM | POA: Diagnosis not present

## 2024-02-20 DIAGNOSIS — H35371 Puckering of macula, right eye: Secondary | ICD-10-CM | POA: Diagnosis not present

## 2024-02-20 DIAGNOSIS — H353112 Nonexudative age-related macular degeneration, right eye, intermediate dry stage: Secondary | ICD-10-CM | POA: Diagnosis not present

## 2024-02-20 DIAGNOSIS — H43813 Vitreous degeneration, bilateral: Secondary | ICD-10-CM | POA: Diagnosis not present

## 2024-02-22 ENCOUNTER — Encounter (INDEPENDENT_AMBULATORY_CARE_PROVIDER_SITE_OTHER): Payer: Self-pay | Admitting: Internal Medicine

## 2024-02-22 ENCOUNTER — Ambulatory Visit (INDEPENDENT_AMBULATORY_CARE_PROVIDER_SITE_OTHER): Admitting: Internal Medicine

## 2024-02-22 VITALS — BP 139/86 | HR 67 | Temp 98.3°F | Ht 66.0 in | Wt 163.0 lb

## 2024-02-22 DIAGNOSIS — G4733 Obstructive sleep apnea (adult) (pediatric): Secondary | ICD-10-CM | POA: Diagnosis not present

## 2024-02-22 DIAGNOSIS — R638 Other symptoms and signs concerning food and fluid intake: Secondary | ICD-10-CM

## 2024-02-22 DIAGNOSIS — Z6826 Body mass index (BMI) 26.0-26.9, adult: Secondary | ICD-10-CM

## 2024-02-22 DIAGNOSIS — I10 Essential (primary) hypertension: Secondary | ICD-10-CM

## 2024-02-22 DIAGNOSIS — E669 Obesity, unspecified: Secondary | ICD-10-CM | POA: Diagnosis not present

## 2024-02-22 NOTE — Assessment & Plan Note (Signed)
 Weight: decrease of 68.8 lb (29.7%) over 1 year, 11 months  Start: 03/24/2022 231 lb 12.8 oz (105.1 kg)  End: 02/22/2024 163 lb (73.9 kg)   She has lost four pounds since the last visit, following a 1200 calorie nutrition plan. Her BMI is 26, and body fat percentage is 38%, with a target of 36%. She uses Zepbound  injections to control appetite and portion sizes. She has been physically active, walking during a recent cruise, and plans to resume Pilates and treadmill exercises. She is nearing a weight where further loss may lead to muscle loss rather than fat loss. Discussed potential lax skin due to weight loss and the option of plastic surgery for contouring as a personal choice. - Continue 1200 calorie nutrition plan. - Continue Zepbound  injections as prescribed. - Resume Pilates and treadmill exercises. - Schedule follow-up in 4 weeks.

## 2024-02-22 NOTE — Assessment & Plan Note (Signed)
 Blood pressure improved from 139/86 mmHg to 136/71 mmHg. She is on amlodipine , atenolol , and losartan /hydrochlorothiazide (Hyzaar) with no reported side effects. Discussed the importance of monitoring electrolytes and kidney function due to hydrochlorothiazide's effect on potassium and sodium levels. - Check kidney function and electrolytes today. - Continue monitoring blood pressure for now

## 2024-02-22 NOTE — Progress Notes (Signed)
 Office: [phone]  /  Fax: [phone]  Weight Summary and Body Composition Analysis (BIA)  Vitals Temp: 98.3 F (36.8 C) BP: 139/86 Pulse Rate: 67 SpO2: 99 %   Anthropometric Measurements Height: 5' 6 (1.676 m) Weight: 163 lb (73.9 kg) BMI (Calculated): 26.32 Weight at Last Visit: 167 lb Weight Lost Since Last Visit: 4 lb Weight Gained Since Last Visit: 0 lb Starting Weight: 224 lb Total Weight Loss (lbs): 61 lb (27.7 kg) Peak Weight: 233 lb   Body Composition  Body Fat %: 38.5 % Fat Mass (lbs): 63 lbs Muscle Mass (lbs): 95.2 lbs Total Body Water (lbs): 67.6 lbs Visceral Fat Rating : 11    RMR: 1411  Today's Visit #: 20  Starting Date: 12/23/21   Subjective   Chief Complaint: Obesity  Interval History Discussed the use of AI scribe software for clinical note transcription with the patient, who gave verbal consent to proceed.  History of Present Illness Sherry Davila is a 78 year old female who presents for medical weight management follow-up.  She has lost four pounds since her last visit, following a 1200 calorie nutrition plan. She attributes the weight loss to being more careful with her diet, focusing on vegetables and protein, and the use of Zepbound , which helps control her appetite and portion sizes.  She has been physically active, walking a lot on a recent cruise and plans to resume Pilates and treadmill exercises after a month-long break.  She is currently taking Zepbound  injections, with a full month supply remaining. She is also on multiple blood pressure medications, including amlodipine , atenolol , and losartan  hydrochlorothiazide (Hyzaar), with no changes to her regimen.  No issues with her medication, including constipation, and no recent blood work since her last labs in March. She mentions a canceled physical appointment that was not rescheduled.  Socially, she recently traveled to [place] with her husband and went on a cruise  with her son's mother-in-law. She enjoys the sun and has a history of smoking, which she quit. She is independent and physically active, enjoying travel and maintaining her health.     Challenges affecting patient progress: recent lapse in weight management plan due to work, travel, illness or family circumstances.    Pharmacotherapy for weight management: She is currently taking Zepbound  with adequate clinical response  and without side effects..   Assessment and Plan   Treatment Plan For Obesity:  Recommended Dietary Goals  Camia is currently in the action stage of change. As such, her goal is to continue weight management plan. She has agreed to: continue current plan  Behavioral Health and Counseling  We discussed the following behavioral modification strategies today: continue to work on maintaining a reduced calorie state, getting the recommended amount of protein, incorporating whole foods, making healthy choices, staying well hydrated and practicing mindfulness when eating. and increase protein intake, fibrous foods (25 grams per day for women, 30 grams for men) and water to improve satiety and decrease hunger signals. .  Additional education and resources provided today: None  Recommended Physical Activity Goals  Madie has been advised to work up to 150 minutes of moderate intensity aerobic activity a week and strengthening exercises 2-3 times per week for cardiovascular health, weight loss maintenance and preservation of muscle mass.  She has agreed to :  Increase volume of physical activity to a goal of 240 minutes a week and Combine aerobic and strengthening exercises for efficiency and improved cardiometabolic health.  Medical Interventions and Pharmacotherapy  We discussed various medication options to help Sylena with her weight loss efforts and we both agreed to : Adequate clinical response to anti-obesity medication, continue current regimen  Associated Conditions  Impacted by Obesity Treatment  Assessment & Plan OSA on CPAP She has sleep apnea and uses CPAP.  She has lost 26% of total body weight.  Her husband has noticed some improvement in snoring,  - Consider repeat sleep study if symptoms persist -Continue current weight management strategy inclusive of Zepbound . Essential hypertension Blood pressure improved from 139/86 mmHg to 136/71 mmHg. She is on amlodipine , atenolol , and losartan /hydrochlorothiazide (Hyzaar) with no reported side effects. Discussed the importance of monitoring electrolytes and kidney function due to hydrochlorothiazide's effect on potassium and sodium levels. - Check kidney function and electrolytes today. - Continue monitoring blood pressure for now Abnormal food appetite Improved on Zepbound   She has increased orexigenic signaling, impaired satiety and inhibitory control. This is secondary to an abnormal energy regulation system and pathological neurohormonal pathways characteristic of excess adiposity.  In addition to nutritional and behavioral strategies she benefits from pharmacotherapy.  Continue Zepbound  at current dose Generalized obesity- starting BMI 37 BMI 26.0-26.9,adult Weight: decrease of 68.8 lb (29.7%) over 1 year, 11 months  Start: 03/24/2022 231 lb 12.8 oz (105.1 kg)  End: 02/22/2024 163 lb (73.9 kg)   She has lost four pounds since the last visit, following a 1200 calorie nutrition plan. Her BMI is 26, and body fat percentage is 38%, with a target of 36%. She uses Zepbound  injections to control appetite and portion sizes. She has been physically active, walking during a recent cruise, and plans to resume Pilates and treadmill exercises. She is nearing a weight where further loss may lead to muscle loss rather than fat loss. Discussed potential lax skin due to weight loss and the option of plastic surgery for contouring as a personal choice. - Continue 1200 calorie nutrition plan. - Continue Zepbound  injections  as prescribed. - Resume Pilates and treadmill exercises. - Schedule follow-up in 4 weeks.         Objective   Physical Exam:  Blood pressure 139/86, pulse 67, temperature 98.3 F (36.8 C), height 5' 6 (1.676 m), weight 163 lb (73.9 kg), SpO2 99%. Body mass index is 26.31 kg/m.  General: She is overweight, cooperative, alert, well developed, and in no acute distress. PSYCH: Has normal mood, affect and thought process.   HEENT: EOMI, sclerae are anicteric. Lungs: Normal breathing effort, no conversational dyspnea. Extremities: No edema.  Neurologic: No gross sensory or motor deficits. No tremors or fasciculations noted.    Diagnostic Data Reviewed:  BMET    Component Value Date/Time   NA 139 07/24/2023 0918   NA 140 04/09/2018 1126   K 4.3 07/24/2023 0918   CL 103 07/24/2023 0918   CO2 28 07/24/2023 0918   GLUCOSE 85 07/24/2023 0918   BUN 18 07/24/2023 0918   BUN 22 04/09/2018 1126   CREATININE 0.52 07/24/2023 0918   CALCIUM  9.3 07/24/2023 0918   GFRNONAA >60 01/18/2023 1000   GFRAA 108 04/09/2018 1126   Lab Results  Component Value Date   HGBA1C 4.9 07/24/2023   HGBA1C 5.6 10/07/2020   No results found for: INSULIN  Lab Results  Component Value Date   TSH 2.30 03/23/2023   CBC    Component Value Date/Time   WBC 6.0 01/18/2023 1000   RBC 4.82 01/18/2023 1000   HGB 14.2 01/18/2023 1000   HGB 14.1 09/28/2017 1557  HCT 43.9 01/18/2023 1000   HCT 41.8 09/28/2017 1557   PLT 260 01/18/2023 1000   PLT 234 09/28/2017 1557   MCV 91.1 01/18/2023 1000   MCV 91 09/28/2017 1557   MCH 29.5 01/18/2023 1000   MCHC 32.3 01/18/2023 1000   RDW 14.3 01/18/2023 1000   RDW 14.6 09/28/2017 1557   Iron Studies No results found for: IRON, TIBC, FERRITIN, IRONPCTSAT Lipid Panel     Component Value Date/Time   CHOL 224 (H) 07/24/2023 0918   CHOL 177 04/09/2018 1124   TRIG 72.0 07/24/2023 0918   TRIG 83 04/09/2018 1124   HDL 94.60 07/24/2023 0918   HDL  109 09/28/2017 1557   CHOLHDL 2 07/24/2023 0918   VLDL 14.4 07/24/2023 0918   VLDL 17 04/09/2018 1124   LDLCALC 115 (H) 07/24/2023 0918   LDLCALC 110 (H) 09/28/2017 1557   Hepatic Function Panel     Component Value Date/Time   PROT 6.7 07/24/2023 0918   PROT 7.0 09/28/2017 1557   ALBUMIN 4.1 07/24/2023 0918   ALBUMIN 4.7 09/28/2017 1557   AST 16 07/24/2023 0918   AST 28 04/09/2018 1124   ALT 14 07/24/2023 0918   ALT 29 04/09/2018 1124   ALKPHOS 78 07/24/2023 0918   BILITOT 0.6 07/24/2023 0918   BILITOT 0.4 09/28/2017 1557   BILIDIR 0.1 07/24/2023 0918      Component Value Date/Time   TSH 2.30 03/23/2023 0926   Nutritional Lab Results  Component Value Date   VD25OH 24.2 (L) 12/23/2021    Medications: Outpatient Encounter Medications as of 02/22/2024  Medication Sig   amLODipine  (NORVASC ) 10 MG tablet Take 1 tablet (10 mg total) by mouth daily.   atenolol  (TENORMIN ) 25 MG tablet Take 1 tablet (25 mg total) by mouth daily.   FLUoxetine  (PROZAC ) 40 MG capsule Take 1 capsule (40 mg total) by mouth daily.   losartan -hydrochlorothiazide (HYZAAR) 100-12.5 MG tablet Take 1 tablet by mouth daily.   Multiple Vitamins-Minerals (MULTIVITAMIN WITH MINERALS) tablet Take 1 tablet by mouth daily.   ZEPBOUND  10 MG/0.5ML Pen INJECT 10 MG SUBCUTANEOUSLY ONCE A WEEK   [DISCONTINUED] gabapentin  (NEURONTIN ) 100 MG capsule Take 100 mg by mouth 3 (three) times daily.   No facility-administered encounter medications on file as of 02/22/2024.     Follow-Up   Return in about 4 weeks (around 03/21/2024) for For Weight Mangement with Dr. Francyne.SABRA She was informed of the importance of frequent follow up visits to maximize her success with intensive lifestyle modifications for her multiple health conditions.  Attestation Statement   Reviewed by clinician on day of visit: allergies, medications, problem list, medical history, surgical history, family history, social history, and previous  encounter notes.     Lucas Francyne, MD

## 2024-02-22 NOTE — Assessment & Plan Note (Signed)
 Improved on Zepbound  She has increased orexigenic signaling, impaired satiety and inhibitory control. This is secondary to an abnormal energy regulation system and pathological neurohormonal pathways characteristic of excess adiposity.  In addition to nutritional and behavioral strategies she benefits from pharmacotherapy.  Continue Zepbound at current dose

## 2024-02-22 NOTE — Assessment & Plan Note (Signed)
 She has sleep apnea and uses CPAP.  She has lost 26% of total body weight.  Her husband has noticed some improvement in snoring,  - Consider repeat sleep study if symptoms persist -Continue current weight management strategy inclusive of Zepbound .

## 2024-02-23 LAB — CMP14+EGFR
ALT: 12 IU/L (ref 0–32)
AST: 17 IU/L (ref 0–40)
Albumin: 4.3 g/dL (ref 3.8–4.8)
Alkaline Phosphatase: 105 IU/L (ref 49–135)
BUN/Creatinine Ratio: 26 (ref 12–28)
BUN: 21 mg/dL (ref 8–27)
Bilirubin Total: 0.6 mg/dL (ref 0.0–1.2)
CO2: 20 mmol/L (ref 20–29)
Calcium: 9.3 mg/dL (ref 8.7–10.3)
Chloride: 100 mmol/L (ref 96–106)
Creatinine, Ser: 0.8 mg/dL (ref 0.57–1.00)
Globulin, Total: 2 g/dL (ref 1.5–4.5)
Glucose: 91 mg/dL (ref 70–99)
Potassium: 4.5 mmol/L (ref 3.5–5.2)
Sodium: 136 mmol/L (ref 134–144)
Total Protein: 6.3 g/dL (ref 6.0–8.5)
eGFR: 76 mL/min/1.73 (ref >59)

## 2024-02-26 ENCOUNTER — Ambulatory Visit (INDEPENDENT_AMBULATORY_CARE_PROVIDER_SITE_OTHER): Payer: Self-pay | Admitting: Internal Medicine

## 2024-03-19 ENCOUNTER — Ambulatory Visit (INDEPENDENT_AMBULATORY_CARE_PROVIDER_SITE_OTHER): Admitting: Internal Medicine

## 2024-03-19 ENCOUNTER — Encounter (INDEPENDENT_AMBULATORY_CARE_PROVIDER_SITE_OTHER): Payer: Self-pay | Admitting: Internal Medicine

## 2024-03-19 VITALS — BP 138/86 | HR 62 | Temp 97.9°F | Ht 66.0 in | Wt 163.0 lb

## 2024-03-19 DIAGNOSIS — R296 Repeated falls: Secondary | ICD-10-CM | POA: Diagnosis not present

## 2024-03-19 DIAGNOSIS — E669 Obesity, unspecified: Secondary | ICD-10-CM | POA: Diagnosis not present

## 2024-03-19 DIAGNOSIS — I10 Essential (primary) hypertension: Secondary | ICD-10-CM | POA: Diagnosis not present

## 2024-03-19 DIAGNOSIS — G4733 Obstructive sleep apnea (adult) (pediatric): Secondary | ICD-10-CM

## 2024-03-19 DIAGNOSIS — Z6826 Body mass index (BMI) 26.0-26.9, adult: Secondary | ICD-10-CM

## 2024-03-19 DIAGNOSIS — Z8781 Personal history of (healed) traumatic fracture: Secondary | ICD-10-CM | POA: Insufficient documentation

## 2024-03-19 DIAGNOSIS — H3530 Unspecified macular degeneration: Secondary | ICD-10-CM | POA: Diagnosis not present

## 2024-03-19 NOTE — Assessment & Plan Note (Signed)
 We briefly reviewed some of the literature with patient regarding GLP-1 and and macular degeneration.  She will have further discussions with her retinal specialist.

## 2024-03-19 NOTE — Assessment & Plan Note (Signed)
 She has sleep apnea and uses CPAP.  She has lost 26% of total body weight.  Her husband has noticed some improvement in snoring,  - Consider repeat sleep study if symptoms persist -Continue current weight management strategy inclusive of Zepbound .

## 2024-03-19 NOTE — Progress Notes (Signed)
 Office: [phone]  /  Fax: [phone]  Weight Summary and Body Composition Analysis (BIA)  Vitals Temp: 97.9 F (36.6 C) BP: 138/86 Pulse Rate: 62 SpO2: 100 %   Anthropometric Measurements Height: 5' 6 (1.676 m) Weight: 163 lb (73.9 kg) BMI (Calculated): 26.32 Weight at Last Visit: 163 lb Weight Lost Since Last Visit: 0 lb Weight Gained Since Last Visit: 0 lb Starting Weight: 224 lb Total Weight Loss (lbs): 61 lb (27.7 kg) Peak Weight: 233 lb   Body Composition  Body Fat %: 40.2 % Fat Mass (lbs): 65.6 lbs Muscle Mass (lbs): 92.4 lbs Total Body Water (lbs): 67.8 lbs Visceral Fat Rating : 11    RMR: 1411  Today's Visit #: 21  Starting Date: 12/23/21   Subjective   Chief Complaint: Obesity  Interval History Discussed the use of AI scribe software for clinical note transcription with the patient, who gave verbal consent to proceed.  History of Present Illness Sherry Davila is a 78 year old female who presents for medical weight management.  Since last office visit she has maintained she is following a 1200-calorie nutrition plan 50% of the time.  She is exercising 2 days a week 15 minutes doing Pilates.  She has been successful in losing weight and aims to reach a goal weight of 150 pounds. She finds it challenging to achieve and maintain this weight. She is currently on Zepbound  for weight management, which she has been taking since February 2024.  She has a history of back pain, which is improving but remains problematic when standing for extended periods, such as in the kitchen. An MRI in August 2025 revealed an old compression fracture at T7 and arthritis in the thoracic spine. She has not been on treatment for osteoporosis despite a history of falls, which she attributes to her tendency to fall frequently. She recalls a recent fall in [place], which she describes humorously, but does not associate it with her spine fracture. She has not had a  bone density test since 2015 and has not seen her primary care doctor recently due to a missed appointment.  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 She is under the care of a retina specialist and receives injections for her condition. She is concerned about the potential side effects of her medication, Zepbound , on her vision, although she does not recall when she was first diagnosed with macular degeneration.  She reports frequent falls and a recent humorous fall incident in [place]. She travels often and socializes, which includes alcohol consumption.     Challenges affecting patient progress: weight-centric mindset.    Pharmacotherapy for weight management: She is currently taking Zepbound  with adequate clinical response  and without side effects..   Assessment and Plan   Treatment Plan For Obesity:  Recommended Dietary Goals  Sherry Davila is currently in the action stage of change. As such, her goal is to continue weight management plan. She has agreed to: follow the Category 1 plan - 1000 kcal per day  Behavioral Health and Counseling  We discussed the following behavioral modification strategies today: continue to work on maintaining a reduced calorie state, getting the recommended amount of protein, incorporating whole foods, making healthy choices, staying well hydrated and practicing mindfulness when eating. and increase protein intake, fibrous foods (25 grams per day for women, 30 grams for men) and water to improve satiety and decrease hunger  signals. .  Additional education and resources provided today: None  Recommended Physical Activity Goals  Sherry Davila has been advised to work up to 150 minutes of moderate intensity aerobic activity a week and strengthening exercises 2-3 times per week for cardiovascular health, weight loss maintenance and preservation of muscle mass.  She has agreed to :  Think about  enjoyable ways to increase daily physical activity and overcoming barriers to exercise, Increase physical activity in their day and reduce sedentary time (increase NEAT)., Increase volume of physical activity to a goal of 240 minutes a week, and Combine aerobic and strengthening exercises for efficiency and improved cardiometabolic health.  Medical Interventions and Pharmacotherapy  We discussed various medication options to help Sherry Davila with her weight loss efforts and we both agreed to : Adequate clinical response to anti-obesity medication, continue current regimen  Associated Conditions Impacted by Obesity Treatment  Assessment & Plan Hx of compression fracture of spine - T7 She has osteoporosis with a thoracic compression fracture at T7 and facet arthropathy. MRI showed an old compression fracture with associated arthritis and potential left foraminal encroachment at L1. Addressing osteoporosis is important due to the risk of further fractures, especially given her frequent falls and significant weight loss, which could contribute to bone density loss. The last bone density test was in 2015. - Schedule an appointment with her primary care physician to discuss osteoporosis management and the need for a bone density test.  OSA on CPAP She has sleep apnea and uses CPAP.  She has lost 26% of total body weight.  Her husband has noticed some improvement in snoring,  - Consider repeat sleep study if symptoms persist -Continue current weight management strategy inclusive of Zepbound . Macular degeneration of both eyes, unspecified type We briefly reviewed some of the literature with patient regarding GLP-1 and and macular degeneration.  She will have further discussions with her retinal specialist. Generalized obesity BMI 26.0-26.9,adult She is maintaining her weight but finds it challenging to reach her goal weight of 150 lbs. The difficulty of losing the last few pounds is due to the body's natural  resistance and the need for a significant caloric deficit. Maintaining muscle mass and bone density is crucial, especially given her osteoporosis. Increasing medication poses risks of muscle loss and worsening bone density. - Implement a 1000 calorie per day diet plan. - Use a meal replacement, such as a protein shake, for one meal daily. - Provide a meal plan with a grocery list, snack ideas, and a list of healthy proteins and carbs. - Reassess weight management progress in 4 weeks. - Continue Zepbound  at current dose do not recommend further increases Frequent falls She reports frequent falls, potentially related to balance issues or neuropathy. Addressing these falls is crucial to prevent further injury, especially given her osteoporosis. She is engaging in Pilates, which may help with balance and core strength. Neuropathy may affect balance, particularly in dark places, and addressing visual and sensory deficits is important. - Discuss with primary care physician the possibility of neuropathy and the need for physical therapy focused on balance and fall prevention. - Encourage continued participation in Pilates for core strengthening.          Objective   Physical Exam:  Blood pressure 138/86, pulse 62, temperature 97.9 F (36.6 C), height 5' 6 (1.676 m), weight 163 lb (73.9 kg), SpO2 100%. Body mass index is 26.31 kg/m.  General: She is overweight, cooperative, alert, well developed, and in no acute distress. PSYCH: Has  normal mood, affect and thought process.   HEENT: EOMI, sclerae are anicteric. Lungs: Normal breathing effort, no conversational dyspnea. Extremities: No edema.  Neurologic: No gross sensory or motor deficits. No tremors or fasciculations noted.    Diagnostic Data Reviewed:  BMET    Component Value Date/Time   NA 136 02/22/2024 1130   K 4.5 02/22/2024 1130   CL 100 02/22/2024 1130   CO2 20 02/22/2024 1130   GLUCOSE 91 02/22/2024 1130   GLUCOSE 85  07/24/2023 0918   BUN 21 02/22/2024 1130   CREATININE 0.80 02/22/2024 1130   CALCIUM  9.3 02/22/2024 1130   GFRNONAA >60 01/18/2023 1000   GFRAA 108 04/09/2018 1126   Lab Results  Component Value Date   HGBA1C 4.9 07/24/2023   HGBA1C 5.6 10/07/2020   No results found for: INSULIN  Lab Results  Component Value Date   TSH 2.30 03/23/2023   CBC    Component Value Date/Time   WBC 6.0 01/18/2023 1000   RBC 4.82 01/18/2023 1000   HGB 14.2 01/18/2023 1000   HGB 14.1 09/28/2017 1557   HCT 43.9 01/18/2023 1000   HCT 41.8 09/28/2017 1557   PLT 260 01/18/2023 1000   PLT 234 09/28/2017 1557   MCV 91.1 01/18/2023 1000   MCV 91 09/28/2017 1557   MCH 29.5 01/18/2023 1000   MCHC 32.3 01/18/2023 1000   RDW 14.3 01/18/2023 1000   RDW 14.6 09/28/2017 1557   Iron Studies No results found for: IRON, TIBC, FERRITIN, IRONPCTSAT Lipid Panel     Component Value Date/Time   CHOL 224 (H) 07/24/2023 0918   CHOL 177 04/09/2018 1124   TRIG 72.0 07/24/2023 0918   TRIG 83 04/09/2018 1124   HDL 94.60 07/24/2023 0918   HDL 109 09/28/2017 1557   CHOLHDL 2 07/24/2023 0918   VLDL 14.4 07/24/2023 0918   VLDL 17 04/09/2018 1124   LDLCALC 115 (H) 07/24/2023 0918   LDLCALC 110 (H) 09/28/2017 1557   Hepatic Function Panel     Component Value Date/Time   PROT 6.3 02/22/2024 1130   ALBUMIN 4.3 02/22/2024 1130   AST 17 02/22/2024 1130   AST 28 04/09/2018 1124   ALT 12 02/22/2024 1130   ALT 29 04/09/2018 1124   ALKPHOS 105 02/22/2024 1130   BILITOT 0.6 02/22/2024 1130   BILIDIR 0.1 07/24/2023 0918      Component Value Date/Time   TSH 2.30 03/23/2023 0926   Nutritional Lab Results  Component Value Date   VD25OH 24.2 (L) 12/23/2021    Medications: Outpatient Encounter Medications as of 03/19/2024  Medication Sig   amLODipine  (NORVASC ) 10 MG tablet Take 1 tablet (10 mg total) by mouth daily.   atenolol  (TENORMIN ) 25 MG tablet Take 1 tablet (25 mg total) by mouth daily.    FLUoxetine  (PROZAC ) 40 MG capsule Take 1 capsule (40 mg total) by mouth daily.   losartan -hydrochlorothiazide (HYZAAR) 100-12.5 MG tablet Take 1 tablet by mouth daily.   Multiple Vitamins-Minerals (MULTIVITAMIN WITH MINERALS) tablet Take 1 tablet by mouth daily.   ZEPBOUND  10 MG/0.5ML Pen INJECT 10 MG SUBCUTANEOUSLY ONCE A WEEK   No facility-administered encounter medications on file as of 03/19/2024.     Follow-Up   Return in about 4 weeks (around 04/16/2024) for For Weight Mangement with Dr. Francyne.SABRA She was informed of the importance of frequent follow up visits to maximize her success with intensive lifestyle modifications for her multiple health conditions.  Attestation Statement   Reviewed by clinician on day of  visit: allergies, medications, problem list, medical history, surgical history, family history, social history, and previous encounter notes.     Lucas Parker, MD

## 2024-03-19 NOTE — Assessment & Plan Note (Signed)
 She reports frequent falls, potentially related to balance issues or neuropathy. Addressing these falls is crucial to prevent further injury, especially given her osteoporosis. She is engaging in Pilates, which may help with balance and core strength. Neuropathy may affect balance, particularly in dark places, and addressing visual and sensory deficits is important. - Discuss with primary care physician the possibility of neuropathy and the need for physical therapy focused on balance and fall prevention. - Encourage continued participation in Pilates for core strengthening.

## 2024-03-19 NOTE — Assessment & Plan Note (Signed)
 She is maintaining her weight but finds it challenging to reach her goal weight of 150 lbs. The difficulty of losing the last few pounds is due to the body's natural resistance and the need for a significant caloric deficit. Maintaining muscle mass and bone density is crucial, especially given her osteoporosis. Increasing medication poses risks of muscle loss and worsening bone density. - Implement a 1000 calorie per day diet plan. - Use a meal replacement, such as a protein shake, for one meal daily. - Provide a meal plan with a grocery list, snack ideas, and a list of healthy proteins and carbs. - Reassess weight management progress in 4 weeks. - Continue Zepbound  at current dose do not recommend further increases

## 2024-03-19 NOTE — Assessment & Plan Note (Signed)
 She has osteoporosis with a thoracic compression fracture at T7 and facet arthropathy. MRI showed an old compression fracture with associated arthritis and potential left foraminal encroachment at L1. Addressing osteoporosis is important due to the risk of further fractures, especially given her frequent falls and significant weight loss, which could contribute to bone density loss. The last bone density test was in 2015. - Schedule an appointment with her primary care physician to discuss osteoporosis management and the need for a bone density test.

## 2024-03-20 DIAGNOSIS — H353221 Exudative age-related macular degeneration, left eye, with active choroidal neovascularization: Secondary | ICD-10-CM | POA: Diagnosis not present

## 2024-03-20 DIAGNOSIS — H35371 Puckering of macula, right eye: Secondary | ICD-10-CM | POA: Diagnosis not present

## 2024-03-20 DIAGNOSIS — H43813 Vitreous degeneration, bilateral: Secondary | ICD-10-CM | POA: Diagnosis not present

## 2024-03-20 DIAGNOSIS — H353123 Nonexudative age-related macular degeneration, left eye, advanced atrophic without subfoveal involvement: Secondary | ICD-10-CM | POA: Diagnosis not present

## 2024-03-21 ENCOUNTER — Ambulatory Visit (INDEPENDENT_AMBULATORY_CARE_PROVIDER_SITE_OTHER): Admitting: Internal Medicine

## 2024-03-22 ENCOUNTER — Telehealth: Payer: Self-pay

## 2024-03-22 DIAGNOSIS — E2839 Other primary ovarian failure: Secondary | ICD-10-CM

## 2024-03-22 NOTE — Telephone Encounter (Signed)
 Copied from CRM [id number]. Topic: Clinical - Request for Lab/Test Order >> Mar 22, 2024  4:40 PM Dedra B wrote: Reason for CRM: Pt is requesting to have a bone density scan.

## 2024-03-26 NOTE — Addendum Note (Signed)
 Addended by: GLENDIA ALLENA RAMAN on: 03/26/2024 10:37 AM   Modules accepted: Orders

## 2024-03-26 NOTE — Telephone Encounter (Signed)
 Order placed for bone density. Please give information to schedule.

## 2024-03-28 DIAGNOSIS — H353211 Exudative age-related macular degeneration, right eye, with active choroidal neovascularization: Secondary | ICD-10-CM | POA: Diagnosis not present

## 2024-04-01 DIAGNOSIS — H353112 Nonexudative age-related macular degeneration, right eye, intermediate dry stage: Secondary | ICD-10-CM | POA: Diagnosis not present

## 2024-04-01 DIAGNOSIS — H353231 Exudative age-related macular degeneration, bilateral, with active choroidal neovascularization: Secondary | ICD-10-CM | POA: Diagnosis not present

## 2024-04-01 DIAGNOSIS — H2513 Age-related nuclear cataract, bilateral: Secondary | ICD-10-CM | POA: Diagnosis not present

## 2024-04-01 LAB — OPHTHALMOLOGY REPORT-SCANNED

## 2024-04-02 ENCOUNTER — Other Ambulatory Visit

## 2024-04-02 DIAGNOSIS — E785 Hyperlipidemia, unspecified: Secondary | ICD-10-CM | POA: Diagnosis not present

## 2024-04-02 DIAGNOSIS — I10 Essential (primary) hypertension: Secondary | ICD-10-CM | POA: Diagnosis not present

## 2024-04-02 DIAGNOSIS — R739 Hyperglycemia, unspecified: Secondary | ICD-10-CM

## 2024-04-02 LAB — HEPATIC FUNCTION PANEL
ALT: 15 U/L (ref 0–35)
AST: 18 U/L (ref 0–37)
Albumin: 4.5 g/dL (ref 3.5–5.2)
Alkaline Phosphatase: 49 U/L (ref 39–117)
Bilirubin, Direct: 0.2 mg/dL (ref 0.0–0.3)
Total Bilirubin: 0.7 mg/dL (ref 0.2–1.2)
Total Protein: 6.6 g/dL (ref 6.0–8.3)

## 2024-04-02 LAB — BASIC METABOLIC PANEL WITH GFR
BUN: 15 mg/dL (ref 6–23)
CO2: 29 meq/L (ref 19–32)
Calcium: 9.2 mg/dL (ref 8.4–10.5)
Chloride: 100 meq/L (ref 96–112)
Creatinine, Ser: 0.9 mg/dL (ref 0.40–1.20)
GFR: 61.92 mL/min (ref >60.00)
Glucose, Bld: 112 mg/dL — ABNORMAL HIGH (ref 70–99)
Potassium: 4 meq/L (ref 3.5–5.1)
Sodium: 138 meq/L (ref 135–145)

## 2024-04-02 LAB — HEMOGLOBIN A1C: Hgb A1c MFr Bld: 6.1 % (ref 4.6–6.5)

## 2024-04-02 LAB — LIPID PANEL
Cholesterol: 98 mg/dL (ref 0–200)
HDL: 56.6 mg/dL (ref >39.00)
LDL Cholesterol: 27 mg/dL (ref 0–99)
NonHDL: 40.97
Total CHOL/HDL Ratio: 2
Triglycerides: 68 mg/dL (ref 0.0–149.0)
VLDL: 13.6 mg/dL (ref 0.0–40.0)

## 2024-04-03 ENCOUNTER — Ambulatory Visit (INDEPENDENT_AMBULATORY_CARE_PROVIDER_SITE_OTHER): Admitting: Internal Medicine

## 2024-04-03 ENCOUNTER — Ambulatory Visit: Payer: Self-pay | Admitting: Internal Medicine

## 2024-04-03 ENCOUNTER — Encounter: Payer: Self-pay | Admitting: Internal Medicine

## 2024-04-03 VITALS — BP 116/70 | HR 69 | Temp 98.6°F | Ht 66.0 in | Wt 165.0 lb

## 2024-04-03 DIAGNOSIS — M546 Pain in thoracic spine: Secondary | ICD-10-CM

## 2024-04-03 DIAGNOSIS — R739 Hyperglycemia, unspecified: Secondary | ICD-10-CM | POA: Diagnosis not present

## 2024-04-03 DIAGNOSIS — Z1231 Encounter for screening mammogram for malignant neoplasm of breast: Secondary | ICD-10-CM

## 2024-04-03 DIAGNOSIS — E559 Vitamin D deficiency, unspecified: Secondary | ICD-10-CM | POA: Diagnosis not present

## 2024-04-03 DIAGNOSIS — R296 Repeated falls: Secondary | ICD-10-CM | POA: Diagnosis not present

## 2024-04-03 DIAGNOSIS — I10 Essential (primary) hypertension: Secondary | ICD-10-CM | POA: Diagnosis not present

## 2024-04-03 DIAGNOSIS — G4733 Obstructive sleep apnea (adult) (pediatric): Secondary | ICD-10-CM | POA: Diagnosis not present

## 2024-04-03 DIAGNOSIS — E785 Hyperlipidemia, unspecified: Secondary | ICD-10-CM | POA: Diagnosis not present

## 2024-04-03 DIAGNOSIS — E7800 Pure hypercholesterolemia, unspecified: Secondary | ICD-10-CM | POA: Diagnosis not present

## 2024-04-03 DIAGNOSIS — T466X5A Adverse effect of antihyperlipidemic and antiarteriosclerotic drugs, initial encounter: Secondary | ICD-10-CM | POA: Diagnosis not present

## 2024-04-03 DIAGNOSIS — Z8781 Personal history of (healed) traumatic fracture: Secondary | ICD-10-CM

## 2024-04-03 DIAGNOSIS — G720 Drug-induced myopathy: Secondary | ICD-10-CM | POA: Diagnosis not present

## 2024-04-03 MED ORDER — AMLODIPINE BESYLATE 10 MG PO TABS: 10.0000 mg | ORAL_TABLET | Freq: Every day | ORAL | 1 refills | Status: AC

## 2024-04-03 MED ORDER — LOSARTAN POTASSIUM-HCTZ 100-12.5 MG PO TABS
1.0000 | ORAL_TABLET | Freq: Every day | ORAL | 1 refills | Status: AC
Start: 2024-04-03 — End: ?

## 2024-04-03 MED ORDER — FLUOXETINE HCL 40 MG PO CAPS: 40.0000 mg | ORAL_CAPSULE | Freq: Every day | ORAL | 1 refills | Status: AC

## 2024-04-03 NOTE — Progress Notes (Signed)
 Subjective:    Patient ID: Sherry Davila, female    DOB: 1946/04/30, 78 y.o.   MRN: [id number]  Patient here for  Chief Complaint  Patient presents with   Medical Management of Chronic Issues    HPI Here for a scheduled follow up - follow up regarding hypertension and OSA. Saw ortho 11/27/23 - degenerative disc disease and facet arthritis or thoracic spine. Prescribed gabapentin . Referred to PT. Persistent pain. Had f/u with ortho 01/05/24 - recommended MRI - 12/2023 - old compression fracture at T7 and arthritis. Discussed f/u with Dr Dodson. 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 Continues on zepbound  and is followed - weight management program. Has fallen previously. Discussed PT - to help with pain/balance/gait and core strengthening. Also discussed bone density - scheduled. No chest pain reported. Breathing stable. Some drainage in am. No significant chest congestion.    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   right upper arm above elbow, [facility]   Varicose vein    Past Surgical History:  Procedure Laterality Date   COLONOSCOPY WITH PROPOFOL  N/A 09/08/2016   Procedure: COLONOSCOPY WITH PROPOFOL ;  Surgeon: Ruel Kung, MD;  Location: [facility] ENDOSCOPY;  Service: Endoscopy;  Laterality: N/A;   COLONOSCOPY WITH PROPOFOL  N/A 01/07/2020   Procedure: COLONOSCOPY WITH PROPOFOL ;  Surgeon: Kung Ruel, MD;  Location: [facility] ENDOSCOPY;  Service: Gastroenterology;  Laterality: N/A;   FRACTURE SURGERY     foot, screws placed   JOINT REPLACEMENT Right 2009   knee   Family History  Problem Relation Age of Onset    Cancer Mother    Heart failure Mother    Heart disease Mother    Breast cancer Mother 54   Heart attack Mother    Obesity Father    Depression Father    Early death Father    Hyperlipidemia Father    AAA (abdominal aortic aneurysm) Father    Hypertension Father    Heart disease Father    Hypertension Sister    Social History   Socioeconomic History   Marital status: Married    Spouse name: Not on file   Number of children: Not on file   Years of education: Not on file   Highest education level: 12th grade  Occupational History   Not on file  Tobacco Use   Smoking status: Former    Current packs/day: 0.00    Types: Cigarettes    Quit date: 01/19/1988    Years since quitting: 36.2   Smokeless tobacco: Never  Vaping Use   Vaping status: Never Used  Substance and Sexual Activity   Alcohol use: Yes    Alcohol/week: 3.0 standard drinks of alcohol    Types: 3 Shots of liquor per week    Comment: 3 drinks a week socially    Drug use: No   Sexual activity: Not on file  Other Topics Concern   Not on file  Social History Narrative   Married   Social Drivers of Health  Financial Resource Strain: Low Risk  (04/01/2024)   Overall Financial Resource Strain (CARDIA)    Difficulty of Paying Living Expenses: Not hard at all  Food Insecurity: No Food Insecurity (04/01/2024)   Hunger Vital Sign    Worried About Running Out of Food in the Last Year: Never true    Ran Out of Food in the Last Year: Never true  Transportation Needs: No Transportation Needs (04/01/2024)   PRAPARE - Administrator, Civil Service (Medical): No    Lack of Transportation (Non-Medical): No  Physical Activity: Insufficiently Active (04/01/2024)   Exercise Vital Sign    Days of Exercise per Week: 2 days    Minutes of Exercise per Session: 50 min  Stress: No Stress Concern Present (04/01/2024)   [employer] of Occupational Health - Occupational Stress Questionnaire    Feeling of  Stress: Not at all  Social Connections: Unknown (04/01/2024)   Social Connection and Isolation Panel    Frequency of Communication with Friends and Family: Patient declined    Frequency of Social Gatherings with Friends and Family: Patient declined    Attends Religious Services: Patient declined    Database Administrator or Organizations: Yes    Attends Engineer, Structural: More than 4 times per year    Marital Status: Married     Review of Systems  Constitutional:  Negative for appetite change and unexpected weight change.  HENT:  Negative for sinus pressure.        Some am drainage.   Respiratory:  Negative for cough, chest tightness and shortness of breath.   Cardiovascular:  Negative for chest pain, palpitations and leg swelling.  Gastrointestinal:  Negative for abdominal pain, diarrhea, nausea and vomiting.  Genitourinary:  Negative for difficulty urinating and dysuria.  Musculoskeletal:  Positive for back pain. Negative for myalgias.  Skin:  Negative for color change and rash.  Neurological:  Negative for dizziness and headaches.  Psychiatric/Behavioral:  Negative for agitation and dysphoric mood.        Objective:     BP 116/70   Pulse 69   Temp 98.6 F (37 C) (Oral)   Ht 5' 6 (1.676 m)   Wt 165 lb (74.8 kg)   SpO2 100%   BMI 26.63 kg/m  Wt Readings from Last 3 Encounters:  04/03/24 165 lb (74.8 kg)  03/19/24 163 lb (73.9 kg)  02/22/24 163 lb (73.9 kg)    Physical Exam Vitals reviewed.  Constitutional:      General: She is not in acute distress.    Appearance: Normal appearance.  HENT:     Head: Normocephalic and atraumatic.     Right Ear: External ear normal.     Left Ear: External ear normal.     Mouth/Throat:     Pharynx: No oropharyngeal exudate or posterior oropharyngeal erythema.  Eyes:     General: No scleral icterus.       Right eye: No discharge.        Left eye: No discharge.     Conjunctiva/sclera: Conjunctivae normal.  Neck:      Thyroid : No thyromegaly.  Cardiovascular:     Rate and Rhythm: Normal rate and regular rhythm.  Pulmonary:     Effort: No respiratory distress.     Breath sounds: Normal breath sounds. No wheezing.  Abdominal:     General: Bowel sounds are normal.     Palpations: Abdomen is soft.     Tenderness: There is no  abdominal tenderness.  Musculoskeletal:        General: No swelling or tenderness.     Cervical back: Neck supple. No tenderness.  Lymphadenopathy:     Cervical: No cervical adenopathy.  Skin:    Findings: No erythema or rash.  Neurological:     Mental Status: She is alert.  Psychiatric:        Mood and Affect: Mood normal.        Behavior: Behavior normal.         Outpatient Encounter Medications as of 04/03/2024  Medication Sig   amLODipine  (NORVASC ) 10 MG tablet Take 1 tablet (10 mg total) by mouth daily.   atenolol  (TENORMIN ) 25 MG tablet Take 1 tablet (25 mg total) by mouth daily.   FLUoxetine  (PROZAC ) 40 MG capsule Take 1 capsule (40 mg total) by mouth daily.   losartan -hydrochlorothiazide (HYZAAR) 100-12.5 MG tablet Take 1 tablet by mouth daily.   Multiple Vitamins-Minerals (MULTIVITAMIN WITH MINERALS) tablet Take 1 tablet by mouth daily.   ZEPBOUND  10 MG/0.5ML Pen INJECT 10 MG SUBCUTANEOUSLY ONCE A WEEK   [DISCONTINUED] amLODipine  (NORVASC ) 10 MG tablet Take 1 tablet (10 mg total) by mouth daily.   [DISCONTINUED] FLUoxetine  (PROZAC ) 40 MG capsule Take 1 capsule (40 mg total) by mouth daily.   [DISCONTINUED] losartan -hydrochlorothiazide (HYZAAR) 100-12.5 MG tablet Take 1 tablet by mouth daily.   No facility-administered encounter medications on file as of 04/03/2024.     Lab Results  Component Value Date   WBC 6.0 01/18/2023   HGB 14.2 01/18/2023   HCT 43.9 01/18/2023   PLT 260 01/18/2023   GLUCOSE 112 (H) 04/02/2024   CHOL 98 04/02/2024   TRIG 68.0 04/02/2024   HDL 56.60 04/02/2024   LDLCALC 27 04/02/2024   ALT 15 04/02/2024   AST 18 04/02/2024    NA 138 04/02/2024   K 4.0 04/02/2024   CL 100 04/02/2024   CREATININE 0.90 04/02/2024   BUN 15 04/02/2024   CO2 29 04/02/2024   TSH 2.30 03/23/2023   HGBA1C 6.1 04/02/2024    MR THORACIC SPINE WO CONTRAST Result Date: 01/19/2024 CLINICAL DATA:  Upper thoracic region back pain, worse on the left side. EXAM: MRI THORACIC SPINE WITHOUT CONTRAST TECHNIQUE: Multiplanar, multisequence MR imaging of the thoracic spine was performed. No intravenous contrast was administered. COMPARISON:  Radiography 08/29/2023 FINDINGS: Alignment:  Minimal scoliotic curvature.  Mild thoracic kyphosis. Vertebrae: Degenerative endplate marrow edema at the anterior inferior corner of T6 which could relate to back pain. Old compression fracture at T7 with loss of height of about 20%. No retropulsed bone. No other focal finding. No edematous facet arthritis. Cord:  No cord compression or focal cord lesion. Paraspinal and other soft tissues: Negative Disc levels: T1-2: Normal T2-3 and T3-4: Mild noncompressive disc bulges. Bilateral facet osteoarthritis without edema. Findings at these levels could relate to upper thoracic discomfort. T4-5: No disc abnormality.  Mild facet arthritis.  No stenosis. T5-6: Normal T6-7: Mild bulging of the disc.  No compressive stenosis. T7 a: Mild bulging of the disc.  No compressive stenosis. T8-9 and T9-10: Normal T10-11: Mild bulging of the disc.  No compressive stenosis. T11-12: Normal T12-L1: Mild bulging of the disc.  No stenosis. L1-2: This level was not studied in detail. No significant disc finding. There is facet arthropathy on the left including the presence of a synovial cyst. There is potential for left foraminal encroachment which could affect the L1 nerve. Compared to a study of October 2023, this  appears quite similar. IMPRESSION: 1. Degenerative endplate marrow edema at the anterior inferior corner of T6 which could relate to back pain. 2. Old compression fracture at T7 with loss of  height of about 20%. No retropulsed bone. This is completely healed. 3. Spondylosis and facet osteoarthritis at T2-3 and T3-4 which could relate to upper thoracic discomfort. 4. Mild non-compressive disc bulges at T6-7, T7-8, T10-11 and T12-L1. 5. Facet arthropathy on the left at L1-2 with a synovial cyst. There is potential for left foraminal encroachment which could affect the left L1 nerve. Compared to a study of October 2023, this appears quite similar. Electronically Signed   By: Oneil Officer M.D.   On: 01/19/2024 16:18       Assessment & Plan:  Pure hypercholesterolemia Assessment & Plan: The ASCVD Risk score (Arnett DK, et al., 2019) failed to calculate for the following reasons:   The valid total cholesterol range is 130 to 320 mg/dL  Discussed current labs.  Had intolerance to statins.  Had been onZetia.  Labs as outlined.  Have discussed treatment with Repatha . Continue diet and exercise.   Orders: -     CBC with Differential/Platelet; Future -     TSH; Future  Visit for screening mammogram -     3D Screening Mammogram, Left and Right; Future  Hyperglycemia Assessment & Plan: Low carb diet and exercise. Follow met b and A1c.   Lab Results  Component Value Date   HGBA1C 6.1 04/02/2024      Essential hypertension Assessment & Plan: Continue Hyzaar, amlodipine  and atenolol .  Follow pressures. Blood pressure as outlined. No change in medication today.   Orders: -     Basic metabolic panel with GFR; Future  Hyperlipidemia, unspecified hyperlipidemia type Assessment & Plan: The ASCVD Risk score (Arnett DK, et al., 2019) failed to calculate for the following reasons:   The valid total cholesterol range is 130 to 320 mg/dL  Discussed current labs.  Had intolerance to statins.  Had been onZetia.  Labs as outlined.  Have discussed treatment with Repatha . Continue diet and exercise.   Orders: -     Hepatic function panel; Future -     Lipid panel; Future  Vitamin D   deficiency Assessment & Plan: Plan vitamin D  check with next labs.    Statin myopathy Assessment & Plan: Intolerant to statin medication.    OSA on CPAP Assessment & Plan: Continue cpap.    Hx of compression fracture of spine - T7 Assessment & Plan: Compression fracture as outlined. Persistent pain. Has seen ortho. MRI as outlined. Discussed f/u with Dr Dodson. Obtain bone density - scheduled.   Orders: -     Ambulatory referral to Physical Therapy  Frequent falls Assessment & Plan: Had reported previous falls. Discussed PT - for balance/strengthening. Agreeable.   Orders: -     Ambulatory referral to Physical Therapy  Midline thoracic back pain, unspecified chronicity -     Ambulatory referral to Orthopedic Surgery  Other orders -     amLODIPine  Besylate; Take 1 tablet (10 mg total) by mouth daily.  Dispense: 90 tablet; Refill: 1 -     FLUoxetine  HCl; Take 1 capsule (40 mg total) by mouth daily.  Dispense: 90 capsule; Refill: 1 -     Losartan  Potassium-HCTZ; Take 1 tablet by mouth daily.  Dispense: 90 tablet; Refill: 1     Allena Hamilton, MD

## 2024-04-08 ENCOUNTER — Encounter: Payer: Self-pay | Admitting: Internal Medicine

## 2024-04-08 NOTE — Assessment & Plan Note (Signed)
 The ASCVD Risk score (Arnett DK, et al., 2019) failed to calculate for the following reasons:   The valid total cholesterol range is 130 to 320 mg/dL  Discussed current labs.  Had intolerance to statins.  Had been onZetia.  Labs as outlined.  Have discussed treatment with Repatha . Continue diet and exercise.

## 2024-04-08 NOTE — Assessment & Plan Note (Signed)
 Had reported previous falls. Discussed PT - for balance/strengthening. Agreeable.

## 2024-04-08 NOTE — Assessment & Plan Note (Signed)
 Continue cpap.

## 2024-04-08 NOTE — Assessment & Plan Note (Signed)
 Plan vitamin D  check with next labs.

## 2024-04-08 NOTE — Assessment & Plan Note (Signed)
 Low carb diet and exercise. Follow met b and A1c.   Lab Results  Component Value Date   HGBA1C 6.1 04/02/2024

## 2024-04-08 NOTE — Assessment & Plan Note (Signed)
 Continue Hyzaar, amlodipine  and atenolol .  Follow pressures. Blood pressure as outlined. No change in medication today.

## 2024-04-08 NOTE — Assessment & Plan Note (Signed)
Intolerant to statin medication.

## 2024-04-08 NOTE — Assessment & Plan Note (Signed)
 Compression fracture as outlined. Persistent pain. Has seen ortho. MRI as outlined. Discussed f/u with Dr Dodson. Obtain bone density - scheduled.

## 2024-04-12 ENCOUNTER — Ambulatory Visit: Payer: Medicare Other

## 2024-04-12 VITALS — Ht 66.0 in | Wt 165.0 lb

## 2024-04-12 DIAGNOSIS — Z0000 Encounter for general adult medical examination without abnormal findings: Secondary | ICD-10-CM

## 2024-04-12 NOTE — Patient Instructions (Signed)
 Ms. Verdi,  Thank you for taking the time for your Medicare Wellness Visit. I appreciate your continued commitment to your health goals. Please review the care plan we discussed, and feel free to reach out if I can assist you further.  Please note that Annual Wellness Visits do not include a physical exam. Some assessments may be limited, especially if the visit was conducted virtually. If needed, we may recommend an in-person follow-up with your provider.  Ongoing Care Seeing your primary care provider every 3 to 6 months helps us  monitor your health and provide consistent, personalized care.  Remember to update your covid vaccine.   Referrals If a referral was made during today's visit and you haven't received any updates within two weeks, please contact the referred provider directly to check on the status.  Recommended Screenings:  Health Maintenance  Topic Date Due   Breast Cancer Screening  11/30/2023   COVID-19 Vaccine (8 - 2025-26 season) 01/22/2024   Zoster (Shingles) Vaccine (2 of 2) 05/20/2024   Colon Cancer Screening  01/06/2025   Medicare Annual Wellness Visit  04/12/2025   DTaP/Tdap/Td vaccine (3 - Td or Tdap) 12/09/2026   Pneumococcal Vaccine for age over 51  Completed   Flu Shot  Completed   Osteoporosis screening with Bone Density Scan  Completed   Hepatitis C Screening  Completed   Meningitis B Vaccine  Aged Out       04/12/2024   11:00 AM  Advanced Directives  Does Patient Have a Medical Advance Directive? Yes  Type of Estate Agent of [place];Living will  Does patient want to make changes to medical advance directive? No - Patient declined  Copy of Healthcare Power of Attorney in Chart? No - copy requested    Vision: Annual vision screenings are recommended for early detection of glaucoma, cataracts, and diabetic retinopathy. These exams can also reveal signs of chronic conditions such as diabetes and high blood pressure.  Dental:  Annual dental screenings help detect early signs of oral cancer, gum disease, and other conditions linked to overall health, including heart disease and diabetes.  Please see the attached documents for additional preventive care recommendations.

## 2024-04-12 NOTE — Progress Notes (Signed)
 Chief Complaint  Patient presents with   Medicare Wellness     Subjective:   Sherry Davila is a 78 y.o. female who presents for a Medicare Annual Wellness Visit.  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   right upper arm above elbow, EDC   Varicose vein    Past Surgical History:  Procedure Laterality Date   COLONOSCOPY WITH PROPOFOL  N/A 09/08/2016   Procedure: COLONOSCOPY WITH PROPOFOL ;  Surgeon: Ruel Kung, MD;  Location: [facility] ENDOSCOPY;  Service: Endoscopy;  Laterality: N/A;   COLONOSCOPY WITH PROPOFOL  N/A 01/07/2020   Procedure: COLONOSCOPY WITH PROPOFOL ;  Surgeon: Kung Ruel, MD;  Location: [facility] ENDOSCOPY;  Service: Gastroenterology;  Laterality: N/A;   FRACTURE SURGERY     foot, screws placed   JOINT REPLACEMENT Right 2009   knee   Family History  Problem Relation Age of Onset   Cancer Mother    Heart failure Mother    Heart disease Mother    Breast cancer Mother 22   Heart attack Mother    Obesity Father    Depression Father    Early death Father    Hyperlipidemia Father    AAA (abdominal aortic aneurysm) Father    Hypertension Father    Heart disease Father    Hypertension Sister    Social History   Occupational History   Not on file  Tobacco Use   Smoking status: Former    Current packs/day: 0.00    Types: Cigarettes    Quit date: 01/19/1988    Years since quitting: 36.2   Smokeless tobacco: Never  Vaping Use   Vaping status: Never Used  Substance and Sexual Activity   Alcohol use: Yes    Alcohol/week: 3.0 standard drinks of alcohol     Types: 3 Shots of liquor per week    Comment: 3 drinks a week socially    Drug use: No   Sexual activity: Not on file   Tobacco Counseling Counseling given: Not Answered  SDOH Screenings   Food Insecurity: No Food Insecurity (04/12/2024)  Housing: Low Risk  (04/12/2024)  Transportation Needs: No Transportation Needs (04/12/2024)  Utilities: Not At Risk (04/12/2024)  Alcohol Screen: Low Risk  (04/12/2024)  Depression (PHQ2-9): Low Risk  (04/12/2024)  Financial Resource Strain: Low Risk  (04/12/2024)  Physical Activity: Sufficiently Active (04/12/2024)  Recent Concern: Physical Activity - Insufficiently Active (04/01/2024)  Social Connections: Socially Integrated (04/12/2024)  Stress: No Stress Concern Present (04/12/2024)  Tobacco Use: Medium Risk (04/12/2024)  Health Literacy: Adequate Health Literacy (04/12/2024)   See flowsheets for full screening details  Depression Screen PHQ 2 & 9 Depression Scale- Over the past 2 weeks, how often have you been bothered by any of the following problems? Little interest or pleasure in doing things: 0 Feeling down, depressed, or hopeless (PHQ Adolescent also includes...irritable): 0 PHQ-2 Total Score: 0 Trouble falling or staying asleep, or sleeping too much: 0 Feeling tired or having little energy: 1 Poor  appetite or overeating (PHQ Adolescent also includes...weight loss): 0 Feeling bad about yourself - or that you are a failure or have let yourself or your family down: 0 Trouble concentrating on things, such as reading the newspaper or watching television (PHQ Adolescent also includes...like school work): 0 Moving or speaking so slowly that other people could have noticed. Or the opposite - being so fidgety or restless that you have been moving around a lot more than usual: 0 Thoughts that you would be better off dead, or of hurting yourself in some way: 0 PHQ-9 Total Score: 1 If you checked off any problems, how difficult have these  problems made it for you to do your work, take care of things at home, or get along with other people?: Not difficult at all     Goals Addressed             This Visit's Progress    Patient Stated       Wants to build muscle and lose more weight       Visit info / Clinical Intake: Medicare Wellness Visit Type:: Subsequent Annual Wellness Visit Persons participating in visit:: patient Medicare Wellness Visit Mode:: Telephone If telephone:: video error Because this visit was a virtual/telehealth visit:: pt reported vitals If Telephone or Video please confirm:: I connected with the patient using audio enabled telemedicine application and verified that I am speaking with the correct person using two identifiers; I discussed the limitations of evaluation and management by telemedicine; The patient expressed understanding and agreed to proceed Patient Location:: Home Provider Location:: Office/Home Information given by:: patient Interpreter Needed?: No Pre-visit prep was completed: yes AWV questionnaire completed by patient prior to visit?: no Living arrangements:: lives with spouse/significant other Patient's Overall Health Status Rating: good Typical amount of pain: some Does pain affect daily life?: no Are you currently prescribed opioids?: no  Dietary Habits and Nutritional Risks How many meals a day?: 3 Eats fruit and vegetables daily?: yes Most meals are obtained by: preparing own meals In the last 2 weeks, have you had any of the following?: none Diabetic:: no  Functional Status Activities of Daily Living (to include ambulation/medication): Independent Ambulation: Independent Medication Administration: Independent Home Management: Independent Manage your own finances?: yes Primary transportation is: driving Concerns about vision?: (!) yes (has macular and gets shots) Concerns about hearing?: no  Fall Screening Falls in the past year?: 1 Number of falls in past  year: 1 Was there an injury with Fall?: 0 Fall Risk Category Calculator: 2 Patient Fall Risk Level: Moderate Fall Risk  Fall Risk Patient at Risk for Falls Due to: History of fall(s); Impaired balance/gait Fall risk Follow up: Falls evaluation completed; Falls prevention discussed  Home and Transportation Safety: All rugs have non-skid backing?: yes All stairs or steps have railings?: yes Grab bars in the bathtub or shower?: yes Have non-skid surface in bathtub or shower?: yes Good home lighting?: yes Regular seat belt use?: yes Hospital stays in the last year:: no  Cognitive Assessment Difficulty concentrating, remembering, or making decisions? : no Will 6CIT or Mini Cog be Completed: yes What year is it?: 0 points What month is it?: 0 points Give patient an address phrase to remember (5 components): [address] [place], [place] About what time is it?: 0 points Count backwards from 20 to 1: 0 points Say the months of the year in reverse: 0 points Repeat the address phrase from earlier: 2 points (said street) 6 CIT Score: 2  points  Advance Directives (For Healthcare) Does Patient Have a Medical Advance Directive?: Yes Does patient want to make changes to medical advance directive?: No - Patient declined Type of Advance Directive: Healthcare Power of [place]; Living will Copy of Healthcare Power of Attorney in Chart?: No - copy requested Copy of Living Will in Chart?: No - copy requested  Reviewed/Updated  Reviewed/Updated: Reviewed All (Medical, Surgical, Family, Medications, Allergies, Care Teams, Patient Goals)        Objective:    Today's Vitals   04/12/24 1058  Weight: 165 lb (74.8 kg)  Height: 5' 6 (1.676 m)   Body mass index is 26.63 kg/m.  Current Medications (verified) Outpatient Encounter Medications as of 04/12/2024  Medication Sig   amLODipine  (NORVASC ) 10 MG tablet Take 1 tablet (10 mg total) by mouth daily.   atenolol  (TENORMIN ) 25 MG tablet  Take 1 tablet (25 mg total) by mouth daily.   FLUoxetine  (PROZAC ) 40 MG capsule Take 1 capsule (40 mg total) by mouth daily.   losartan -hydrochlorothiazide (HYZAAR) 100-12.5 MG tablet Take 1 tablet by mouth daily.   Multiple Vitamins-Minerals (MULTIVITAMIN WITH MINERALS) tablet Take 1 tablet by mouth daily.   ZEPBOUND  10 MG/0.5ML Pen INJECT 10 MG SUBCUTANEOUSLY ONCE A WEEK   No facility-administered encounter medications on file as of 04/12/2024.   Hearing/Vision screen Hearing Screening - Comments:: No issues Vision Screening - Comments:: Readers, Dr. Elner, up to date Immunizations and Health Maintenance Health Maintenance  Topic Date Due   Mammogram  11/30/2023   COVID-19 Vaccine (8 - 2025-26 season) 01/22/2024   Zoster Vaccines- Shingrix (2 of 2) 05/20/2024   Colonoscopy  01/06/2025   Medicare Annual Wellness (AWV)  04/12/2025   DTaP/Tdap/Td (3 - Td or Tdap) 12/09/2026   Pneumococcal Vaccine: 50+ Years  Completed   Influenza Vaccine  Completed   Bone Density Scan  Completed   Hepatitis C Screening  Completed   Meningococcal B Vaccine  Aged Out        Assessment/Plan:  This is a routine wellness examination for [place].  Patient Care Team: Glendia Shad, MD as PCP - General (Internal Medicine) Darliss Rogue, MD as PCP - Cardiology (Cardiology) Therisa Bi, MD as Consulting Physician (Gastroenterology)  I have personally reviewed and noted the following in the patient's chart:   Medical and social history Use of alcohol, tobacco or illicit drugs  Current medications and supplements including opioid prescriptions. Functional ability and status Nutritional status Physical activity Advanced directives List of other physicians Hospitalizations, surgeries, and ER visits in previous 12 months Vitals Screenings to include cognitive, depression, and falls Referrals and appointments  No orders of the defined types were placed in this encounter.  In addition, I have  reviewed and discussed with patient certain preventive protocols, quality metrics, and best practice recommendations. A written personalized care plan for preventive services as well as general preventive health recommendations were provided to patient.   Angeline Fredericks, LPN   [id number]/7974   Return in 1 year (on 04/12/2025).  After Visit Summary: (MyChart) Due to this being a telephonic visit, the after visit summary with patients personalized plan was offered to patient via MyChart   Nurse Notes: Discussed the need to update covid vaccine.

## 2024-04-25 ENCOUNTER — Ambulatory Visit (INDEPENDENT_AMBULATORY_CARE_PROVIDER_SITE_OTHER): Payer: Self-pay | Admitting: Internal Medicine

## 2024-04-29 ENCOUNTER — Ambulatory Visit
Admission: RE | Admit: 2024-04-29 | Discharge: 2024-04-29 | Disposition: A | Source: Ambulatory Visit | Attending: Internal Medicine | Admitting: Internal Medicine

## 2024-04-29 DIAGNOSIS — M8589 Other specified disorders of bone density and structure, multiple sites: Secondary | ICD-10-CM | POA: Diagnosis not present

## 2024-04-29 DIAGNOSIS — Z780 Asymptomatic menopausal state: Secondary | ICD-10-CM | POA: Diagnosis not present

## 2024-04-29 DIAGNOSIS — Z1231 Encounter for screening mammogram for malignant neoplasm of breast: Secondary | ICD-10-CM

## 2024-04-29 DIAGNOSIS — E2839 Other primary ovarian failure: Secondary | ICD-10-CM

## 2024-04-30 ENCOUNTER — Ambulatory Visit: Payer: Self-pay | Admitting: Internal Medicine

## 2024-05-02 DIAGNOSIS — H353112 Nonexudative age-related macular degeneration, right eye, intermediate dry stage: Secondary | ICD-10-CM | POA: Diagnosis not present

## 2024-05-02 DIAGNOSIS — H2513 Age-related nuclear cataract, bilateral: Secondary | ICD-10-CM | POA: Diagnosis not present

## 2024-05-02 DIAGNOSIS — H353221 Exudative age-related macular degeneration, left eye, with active choroidal neovascularization: Secondary | ICD-10-CM | POA: Diagnosis not present

## 2024-05-02 DIAGNOSIS — H353211 Exudative age-related macular degeneration, right eye, with active choroidal neovascularization: Secondary | ICD-10-CM | POA: Diagnosis not present

## 2024-05-02 DIAGNOSIS — H353123 Nonexudative age-related macular degeneration, left eye, advanced atrophic without subfoveal involvement: Secondary | ICD-10-CM | POA: Diagnosis not present

## 2024-05-04 ENCOUNTER — Other Ambulatory Visit: Payer: Self-pay | Admitting: Internal Medicine

## 2024-05-09 NOTE — Telephone Encounter (Unsigned)
 Copied from CRM [id number]. Topic: Clinical - Medication Question >> May 08, 2024  4:45 PM Dedra B wrote: Reason for CRM: Pt called to follow up on refill request for ZEPBOUND  10 MG/0.5ML Pen sent by pharmacy 05/04/24.

## 2024-05-10 ENCOUNTER — Telehealth: Payer: Self-pay

## 2024-05-10 NOTE — Telephone Encounter (Signed)
 LVM to call back to inform pt that a PA was needed and when we hear back from them we will reach out to her

## 2024-05-10 NOTE — Telephone Encounter (Signed)
 Copied from CRM [id number]. Topic: Clinical - Medication Question >> May 08, 2024  4:45 PM Dedra B wrote: Reason for CRM: Pt called to follow up on refill request for ZEPBOUND  10 MG/0.5ML Pen sent by pharmacy 05/04/24. >> May 10, 2024  1:14 PM Franky GRADE wrote: Patient is calling to follow up on her refill request for Zepbound , She is scheduled to see Dr.Scott again [facility], she is out currently and is due for the injection.

## 2024-05-13 ENCOUNTER — Other Ambulatory Visit: Payer: Self-pay

## 2024-05-13 MED ORDER — ZEPBOUND 10 MG/0.5ML ~~LOC~~ SOAJ: 10.0000 mg | SUBCUTANEOUS | 0 refills | Status: AC

## 2024-05-13 NOTE — Telephone Encounter (Signed)
 Refill sent

## 2024-05-27 ENCOUNTER — Ambulatory Visit (INDEPENDENT_AMBULATORY_CARE_PROVIDER_SITE_OTHER): Admitting: Internal Medicine

## 2024-06-03 LAB — OPHTHALMOLOGY REPORT-SCANNED

## 2024-06-04 ENCOUNTER — Other Ambulatory Visit

## 2024-06-10 ENCOUNTER — Ambulatory Visit: Admitting: Internal Medicine

## 2024-06-17 ENCOUNTER — Encounter (INDEPENDENT_AMBULATORY_CARE_PROVIDER_SITE_OTHER): Payer: Self-pay

## 2024-06-17 ENCOUNTER — Ambulatory Visit (INDEPENDENT_AMBULATORY_CARE_PROVIDER_SITE_OTHER): Admitting: Internal Medicine

## 2024-07-02 ENCOUNTER — Other Ambulatory Visit

## 2024-07-04 ENCOUNTER — Ambulatory Visit: Admitting: Internal Medicine

## 2024-07-11 ENCOUNTER — Ambulatory Visit: Admitting: Internal Medicine

## 2024-08-27 ENCOUNTER — Ambulatory Visit: Admitting: Dermatology

## 2024-09-02 ENCOUNTER — Ambulatory Visit: Admitting: Dermatology

## 2025-04-21 ENCOUNTER — Ambulatory Visit
